# Patient Record
Sex: Female | Born: 1945 | Race: White | Hispanic: No | Marital: Married | Smoking: Never smoker
Health system: Southern US, Community
[De-identification: ages and names within clinical notes are randomized; demographics above are authoritative.]

## PROBLEM LIST (undated history)

## (undated) DIAGNOSIS — M7632 Iliotibial band syndrome, left leg: Secondary | ICD-10-CM

## (undated) DIAGNOSIS — E119 Type 2 diabetes mellitus without complications: Secondary | ICD-10-CM

## (undated) DIAGNOSIS — I10 Essential (primary) hypertension: Secondary | ICD-10-CM

## (undated) DIAGNOSIS — R5383 Other fatigue: Secondary | ICD-10-CM

## (undated) DIAGNOSIS — I471 Supraventricular tachycardia, unspecified: Secondary | ICD-10-CM

## (undated) DIAGNOSIS — N183 Chronic kidney disease, stage 3 unspecified: Secondary | ICD-10-CM

## (undated) DIAGNOSIS — I700 Atherosclerosis of aorta: Secondary | ICD-10-CM

## (undated) DIAGNOSIS — I2510 Atherosclerotic heart disease of native coronary artery without angina pectoris: Secondary | ICD-10-CM

## (undated) DIAGNOSIS — E538 Deficiency of other specified B group vitamins: Secondary | ICD-10-CM

## (undated) DIAGNOSIS — N3281 Overactive bladder: Secondary | ICD-10-CM

## (undated) DIAGNOSIS — M1990 Unspecified osteoarthritis, unspecified site: Secondary | ICD-10-CM

## (undated) DIAGNOSIS — F329 Major depressive disorder, single episode, unspecified: Secondary | ICD-10-CM

## (undated) DIAGNOSIS — D649 Anemia, unspecified: Secondary | ICD-10-CM

## (undated) DIAGNOSIS — R0609 Other forms of dyspnea: Secondary | ICD-10-CM

## (undated) DIAGNOSIS — E559 Vitamin D deficiency, unspecified: Secondary | ICD-10-CM

## (undated) DIAGNOSIS — E669 Obesity, unspecified: Secondary | ICD-10-CM

## (undated) DIAGNOSIS — N398 Other specified disorders of urinary system: Secondary | ICD-10-CM

## (undated) DIAGNOSIS — H903 Sensorineural hearing loss, bilateral: Secondary | ICD-10-CM

## (undated) DIAGNOSIS — H9193 Unspecified hearing loss, bilateral: Secondary | ICD-10-CM

## (undated) DIAGNOSIS — R001 Bradycardia, unspecified: Secondary | ICD-10-CM

## (undated) DIAGNOSIS — Z9889 Other specified postprocedural states: Secondary | ICD-10-CM

## (undated) DIAGNOSIS — D509 Iron deficiency anemia, unspecified: Secondary | ICD-10-CM

## (undated) DIAGNOSIS — I493 Ventricular premature depolarization: Secondary | ICD-10-CM

## (undated) DIAGNOSIS — I491 Atrial premature depolarization: Secondary | ICD-10-CM

## (undated) DIAGNOSIS — N393 Stress incontinence (female) (male): Secondary | ICD-10-CM

## (undated) DIAGNOSIS — I6529 Occlusion and stenosis of unspecified carotid artery: Secondary | ICD-10-CM

## (undated) DIAGNOSIS — E785 Hyperlipidemia, unspecified: Secondary | ICD-10-CM

## (undated) DIAGNOSIS — N814 Uterovaginal prolapse, unspecified: Secondary | ICD-10-CM

## (undated) DIAGNOSIS — N2889 Other specified disorders of kidney and ureter: Secondary | ICD-10-CM

## (undated) DIAGNOSIS — M109 Gout, unspecified: Secondary | ICD-10-CM

## (undated) DIAGNOSIS — N289 Disorder of kidney and ureter, unspecified: Secondary | ICD-10-CM

## (undated) DIAGNOSIS — R112 Nausea with vomiting, unspecified: Secondary | ICD-10-CM

## (undated) HISTORY — PX: PARATHYROIDECTOMY: SHX19

## (undated) HISTORY — PX: KNEE SURGERY: SHX244

## (undated) HISTORY — PX: JOINT REPLACEMENT: SHX530

## (undated) HISTORY — PX: ABDOMINAL HYSTERECTOMY: SHX81

## (undated) HISTORY — PX: CHOLECYSTECTOMY: SHX55

## (undated) HISTORY — PX: BLADDER SUSPENSION: SHX72

## (undated) HISTORY — PX: CYSTOURETHROSCOPY: SHX476

---

## 2004-07-06 ENCOUNTER — Other Ambulatory Visit: Payer: Self-pay

## 2004-07-15 ENCOUNTER — Inpatient Hospital Stay: Payer: Self-pay | Admitting: Unknown Physician Specialty

## 2004-09-01 ENCOUNTER — Ambulatory Visit: Payer: Self-pay | Admitting: Family Medicine

## 2005-01-29 ENCOUNTER — Ambulatory Visit: Payer: Self-pay | Admitting: Gastroenterology

## 2005-04-01 ENCOUNTER — Ambulatory Visit: Payer: Self-pay | Admitting: Unknown Physician Specialty

## 2005-06-07 ENCOUNTER — Ambulatory Visit: Payer: Self-pay | Admitting: General Surgery

## 2005-11-19 ENCOUNTER — Ambulatory Visit: Payer: Self-pay | Admitting: Family Medicine

## 2005-11-25 ENCOUNTER — Ambulatory Visit: Payer: Self-pay | Admitting: Family Medicine

## 2006-03-28 ENCOUNTER — Other Ambulatory Visit: Payer: Self-pay

## 2006-04-01 ENCOUNTER — Observation Stay: Payer: Self-pay | Admitting: Podiatry

## 2006-05-25 ENCOUNTER — Ambulatory Visit: Payer: Self-pay | Admitting: Family Medicine

## 2007-02-02 ENCOUNTER — Ambulatory Visit: Payer: Self-pay | Admitting: Family Medicine

## 2008-06-06 ENCOUNTER — Ambulatory Visit: Payer: Self-pay | Admitting: Internal Medicine

## 2008-10-10 ENCOUNTER — Ambulatory Visit: Payer: Self-pay | Admitting: Gastroenterology

## 2009-07-08 ENCOUNTER — Ambulatory Visit: Payer: Self-pay | Admitting: Internal Medicine

## 2010-08-25 ENCOUNTER — Ambulatory Visit: Payer: Self-pay | Admitting: Internal Medicine

## 2010-12-16 ENCOUNTER — Ambulatory Visit: Payer: Self-pay | Admitting: Unknown Physician Specialty

## 2011-01-18 ENCOUNTER — Ambulatory Visit: Payer: Self-pay | Admitting: Unknown Physician Specialty

## 2011-01-25 ENCOUNTER — Inpatient Hospital Stay: Payer: Self-pay | Admitting: Unknown Physician Specialty

## 2011-12-03 ENCOUNTER — Ambulatory Visit: Payer: Self-pay | Admitting: Internal Medicine

## 2013-02-09 ENCOUNTER — Ambulatory Visit: Payer: Self-pay | Admitting: Internal Medicine

## 2013-03-19 ENCOUNTER — Ambulatory Visit: Payer: Self-pay | Admitting: Obstetrics and Gynecology

## 2013-03-19 LAB — BASIC METABOLIC PANEL WITH GFR
Anion Gap: 5 — ABNORMAL LOW
BUN: 26 mg/dL — ABNORMAL HIGH
Calcium, Total: 9.3 mg/dL
Chloride: 108 mmol/L — ABNORMAL HIGH
Co2: 29 mmol/L
Creatinine: 1.22 mg/dL
EGFR (African American): 54 — ABNORMAL LOW
EGFR (Non-African Amer.): 46 — ABNORMAL LOW
Glucose: 134 mg/dL — ABNORMAL HIGH
Osmolality: 290
Potassium: 4 mmol/L
Sodium: 142 mmol/L

## 2013-03-19 LAB — HEMOGLOBIN: HGB: 13.9 g/dL

## 2013-03-19 LAB — PREGNANCY, URINE: Pregnancy Test, Urine: NEGATIVE m[IU]/mL

## 2013-03-26 ENCOUNTER — Ambulatory Visit: Payer: Self-pay | Admitting: Obstetrics and Gynecology

## 2013-05-18 ENCOUNTER — Ambulatory Visit: Payer: Self-pay | Admitting: Internal Medicine

## 2013-06-26 DIAGNOSIS — N819 Female genital prolapse, unspecified: Secondary | ICD-10-CM | POA: Insufficient documentation

## 2013-06-26 DIAGNOSIS — E559 Vitamin D deficiency, unspecified: Secondary | ICD-10-CM | POA: Insufficient documentation

## 2013-06-26 DIAGNOSIS — N189 Chronic kidney disease, unspecified: Secondary | ICD-10-CM | POA: Insufficient documentation

## 2013-06-26 DIAGNOSIS — I10 Essential (primary) hypertension: Secondary | ICD-10-CM | POA: Insufficient documentation

## 2013-06-26 DIAGNOSIS — E669 Obesity, unspecified: Secondary | ICD-10-CM | POA: Insufficient documentation

## 2013-07-09 DIAGNOSIS — N814 Uterovaginal prolapse, unspecified: Secondary | ICD-10-CM | POA: Insufficient documentation

## 2013-07-09 DIAGNOSIS — N393 Stress incontinence (female) (male): Secondary | ICD-10-CM | POA: Insufficient documentation

## 2013-08-23 DIAGNOSIS — N398 Other specified disorders of urinary system: Secondary | ICD-10-CM | POA: Insufficient documentation

## 2013-08-23 DIAGNOSIS — N3281 Overactive bladder: Secondary | ICD-10-CM | POA: Insufficient documentation

## 2013-08-23 DIAGNOSIS — Z9889 Other specified postprocedural states: Secondary | ICD-10-CM | POA: Insufficient documentation

## 2014-06-14 ENCOUNTER — Ambulatory Visit: Payer: Self-pay | Admitting: Internal Medicine

## 2014-07-19 DIAGNOSIS — Z461 Encounter for fitting and adjustment of hearing aid: Secondary | ICD-10-CM | POA: Insufficient documentation

## 2015-01-03 NOTE — Op Note (Signed)
PATIENT NAME:  Gabrielle Savage, PETRIDES MR#:  [id number] DATE OF BIRTH:  Dec 06, 1945  DATE OF PROCEDURE:  03/26/2013  PREOPERATIVE DIAGNOSIS: Symptomatic pelvic organ prolapse.  POSTOPERATIVE DIAGNOSIS: Symptomatic pelvic organ prolapse.  PROCEDURE: 1.  Anterior colporrhaphy.  2.  Posterior colporrhaphy. 3.  Enterocele repair.   SURGEON: Ricky L. Amalia Hailey, M.D.   ASSISTANT: Dr. Ouida Sills.   ANESTHESIA: General orotracheal.   FINDINGS: Moderate cystocele, moderate rectocele, significant uterine descensus, much more evident with the patient anesthetized. Again, an enterocele was encountered at the upper posterior vault. Excellent hemostasis and cosmesis.   ESTIMATED BLOOD LOSS: 75 mL.   COMPLICATIONS: None.   SPECIMENS: None.   DRAINS: Foley. Packing is present, soaked in sulfonamide cream  ANTIBIOTICS: 2 grams Ancef preoperatively IV.   PROCEDURE IN DETAIL: The patient was consented, taken to the operating room and placed  in the supine position where anesthesia was initiated. She was placed in the dorsal lithotomy position using Allen stirrups, prepped and draped in the usual sterile fashion and again it was noted at the cervix was sitting at the hymenal ring. The patient had previously expressed desire to not undergo hysterectomy, therefore the anterior vault was visualized. A small Allis was placed 3 cm from the external urethral orifice and immediately above the cervix anteriorly. The anterior wall with then injected with vasopressin. A longitudinal incision was made with a #15 blade. It was blunt and sharp dissected laterally to isolate the cystocele. The central bulge of the cystocele was reduced with pursestrings of 0 Vicryl and then 2-0 Ethibond was used to complete repair. Excess skin was trimmed and the incisions were closed with running interlocking of 2-0 Vicryl.   Next, we turned out attention to the posterior vault. Small Allis clamps were placed at 5 and 7 o'clock wedged along  the perineum. It was excised after injecting vasopressin, but adherent on the posterior vault. Then posterior midline vault in a similar fashion as the anterior. I did encounter enterocele, which was reduced with again successive pursestrings of 2-0 Vicryl. Posterior repair was completed using 2-0 Ethibond and skin closure was completed with a running interlocking of 2-0 and 0 Vicryl. Perineal body was rebuilt using 0 Vicryl as well.   Packing soaked in sulfonamide cream was placed. Foley catheter was placed with spillage of good clear urine and the end of gauze was tied to the catheter.   The patient tolerated the procedure well. I anticipate a routine postoperative course.   The patient will be instructed to minimize maximal straining and if she continues to have symptomatic bulge, she most likely would need hysterectomy including vault suspension, most likely tertiary center.   ____________________________ Rockey Situ. Amalia Hailey, MD rle:aw D: 03/26/2013 12:38:22 ET T: 03/26/2013 13:58:16 ET JOB#: [id number]  cc: Audry Pili L. Amalia Hailey, MD, <Dictator> Selmer Dominion MD ELECTRONICALLY SIGNED 03/27/2013 9:15

## 2015-05-05 DIAGNOSIS — R5383 Other fatigue: Secondary | ICD-10-CM | POA: Insufficient documentation

## 2015-06-13 DIAGNOSIS — H903 Sensorineural hearing loss, bilateral: Secondary | ICD-10-CM | POA: Insufficient documentation

## 2015-12-08 ENCOUNTER — Other Ambulatory Visit: Payer: Self-pay | Admitting: Internal Medicine

## 2015-12-08 DIAGNOSIS — Z1231 Encounter for screening mammogram for malignant neoplasm of breast: Secondary | ICD-10-CM

## 2015-12-19 ENCOUNTER — Ambulatory Visit
Admission: RE | Admit: 2015-12-19 | Discharge: 2015-12-19 | Disposition: A | Discharge status: Home / Self Care | Payer: BLUE CROSS/BLUE SHIELD | Source: Ambulatory Visit | Attending: Internal Medicine | Admitting: Internal Medicine

## 2015-12-19 DIAGNOSIS — Z1231 Encounter for screening mammogram for malignant neoplasm of breast: Secondary | ICD-10-CM | POA: Diagnosis present

## 2016-02-10 DIAGNOSIS — R42 Dizziness and giddiness: Secondary | ICD-10-CM | POA: Insufficient documentation

## 2016-02-16 ENCOUNTER — Other Ambulatory Visit: Payer: Self-pay | Admitting: Otolaryngology

## 2016-02-16 ENCOUNTER — Emergency Department
Admission: EM | Admit: 2016-02-16 | Discharge: 2016-02-16 | Disposition: A | Discharge status: Home / Self Care | Payer: BLUE CROSS/BLUE SHIELD | Attending: Emergency Medicine | Admitting: Emergency Medicine

## 2016-02-16 ENCOUNTER — Encounter: Payer: Self-pay | Admitting: Emergency Medicine

## 2016-02-16 ENCOUNTER — Ambulatory Visit
Admission: RE | Admit: 2016-02-16 | Discharge: 2016-02-16 | Disposition: A | Discharge status: Home / Self Care | Payer: BLUE CROSS/BLUE SHIELD | Source: Ambulatory Visit | Attending: Otolaryngology | Admitting: Otolaryngology

## 2016-02-16 DIAGNOSIS — I10 Essential (primary) hypertension: Secondary | ICD-10-CM | POA: Diagnosis not present

## 2016-02-16 DIAGNOSIS — R51 Headache: Secondary | ICD-10-CM | POA: Insufficient documentation

## 2016-02-16 DIAGNOSIS — R42 Dizziness and giddiness: Secondary | ICD-10-CM

## 2016-02-16 DIAGNOSIS — M1990 Unspecified osteoarthritis, unspecified site: Secondary | ICD-10-CM | POA: Insufficient documentation

## 2016-02-16 DIAGNOSIS — R519 Headache, unspecified: Secondary | ICD-10-CM

## 2016-02-16 HISTORY — DX: Essential (primary) hypertension: I10

## 2016-02-16 HISTORY — DX: Unspecified osteoarthritis, unspecified site: M19.90

## 2016-02-16 HISTORY — DX: Disorder of kidney and ureter, unspecified: N28.9

## 2016-02-16 LAB — BASIC METABOLIC PANEL
ANION GAP: 8 (ref 5–15)
BUN: 37 mg/dL — ABNORMAL HIGH (ref 6–20)
CO2: 23 mmol/L (ref 22–32)
Calcium: 9.7 mg/dL (ref 8.9–10.3)
Chloride: 108 mmol/L (ref 101–111)
Creatinine, Ser: 1.34 mg/dL — ABNORMAL HIGH (ref 0.44–1.00)
GFR calc Af Amer: 46 mL/min — ABNORMAL LOW (ref >60)
GFR calc non Af Amer: 40 mL/min — ABNORMAL LOW (ref >60)
GLUCOSE: 118 mg/dL — AB (ref 65–99)
POTASSIUM: 4 mmol/L (ref 3.5–5.1)
Sodium: 139 mmol/L (ref 135–145)

## 2016-02-16 LAB — CBC
HEMATOCRIT: 39.1 % (ref 35.0–47.0)
HEMOGLOBIN: 12.9 g/dL (ref 12.0–16.0)
MCH: 29.9 pg (ref 26.0–34.0)
MCHC: 33.1 g/dL (ref 32.0–36.0)
MCV: 90.4 fL (ref 80.0–100.0)
Platelets: 140 10*3/uL — ABNORMAL LOW (ref 150–440)
RBC: 4.32 MIL/uL (ref 3.80–5.20)
RDW: 15.1 % — ABNORMAL HIGH (ref 11.5–14.5)
WBC: 6.4 10*3/uL (ref 3.6–11.0)

## 2016-02-16 MED ORDER — SODIUM CHLORIDE 0.9 % IV BOLUS (SEPSIS)
500.0000 mL | Freq: Once | INTRAVENOUS | Status: AC
  Administered 2016-02-16: 500 mL via INTRAVENOUS

## 2016-02-16 MED ORDER — PROCHLORPERAZINE MALEATE 10 MG PO TABS: 10.0000 mg | ORAL_TABLET | Freq: Three times a day (TID) | ORAL | Status: DC | PRN

## 2016-02-16 MED ORDER — PROCHLORPERAZINE EDISYLATE 5 MG/ML IJ SOLN
10.0000 mg | Freq: Once | INTRAMUSCULAR | Status: AC
  Administered 2016-02-16: 10 mg via INTRAVENOUS
  Filled 2016-02-16: qty 2

## 2016-02-16 NOTE — Discharge Instructions (Signed)
Please seek medical attention for any high fevers, chest pain, shortness of breath, change in behavior, persistent vomiting, bloody stool or any other new or concerning symptoms. ° ° °General Headache Without Cause °A headache is pain or discomfort felt around the head or neck area. The specific cause of a headache may not be found. There are many causes and types of headaches. A few common ones are: °· Tension headaches. °· Migraine headaches. °· Cluster headaches. °· Chronic daily headaches. °HOME CARE INSTRUCTIONS  °Watch your condition for any changes. Take these steps to help with your condition: °Managing Pain °· Take over-the-counter and prescription medicines only as told by your health care provider. °· Lie down in a dark, quiet room when you have a headache. °· If directed, apply ice to the head and neck area: °¨ Put ice in a plastic bag. °¨ Place a towel between your skin and the bag. °¨ Leave the ice on for 20 minutes, 2-3 times per day. °· Use a heating pad or hot shower to apply heat to the head and neck area as told by your health care provider. °· Keep lights dim if bright lights bother you or make your headaches worse. °Eating and Drinking °· Eat meals on a regular schedule. °· Limit alcohol use. °· Decrease the amount of caffeine you drink, or stop drinking caffeine. °General Instructions °· Keep all follow-up visits as told by your health care provider. This is important. °· Keep a headache journal to help find out what may trigger your headaches. For example, write down: °¨ What you eat and drink. °¨ How much sleep you get. °¨ Any change to your diet or medicines. °· Try massage or other relaxation techniques. °· Limit stress. °· Sit up straight, and do not tense your muscles. °· Do not use tobacco products, including cigarettes, chewing tobacco, or e-cigarettes. If you need help quitting, ask your health care provider. °· Exercise regularly as told by your health care provider. °· Sleep on a  regular schedule. Get 7-9 hours of sleep, or the amount recommended by your health care provider. °SEEK MEDICAL CARE IF:  °· Your symptoms are not helped by medicine. °· You have a headache that is different from the usual headache. °· You have nausea or you vomit. °· You have a fever. °SEEK IMMEDIATE MEDICAL CARE IF:  °· Your headache becomes severe. °· You have repeated vomiting. °· You have a stiff neck. °· You have a loss of vision. °· You have problems with speech. °· You have pain in the eye or ear. °· You have muscular weakness or loss of muscle control. °· You lose your balance or have trouble walking. °· You feel faint or pass out. °· You have confusion. °  °This information is not intended to replace advice given to you by your health care provider. Make sure you discuss any questions you have with your health care provider. °  °Document Released: 08/30/2005 Document Revised: 05/21/2015 Document Reviewed: 12/23/2014 °Elsevier Interactive Patient Education ©2016 [employer] ° °

## 2016-02-16 NOTE — ED Provider Notes (Signed)
[facility] Emergency Department Provider Note    ____________________________________________  Time seen: ~1845  I have reviewed the triage vital signs and the nursing notes.   HISTORY  Chief Complaint Headache and Dizziness   History limited by: Not Limited   HPI Gabrielle Savage is a 70 y.o. female who presents to the emergency department the request of her ENT doctor to undergo an MRI of her brain to further evaluate for headaches. Patient had an outpatient CT scan done today which did not show any acute lesions. When she talked to her ENT doctor, Dr. Pryor Ochoa, they requested that she present to the ER for an MRI. She has had the headache for roughly 3 weeks. It has been constant. It is located behind both eyes. She has had some dizziness with the headache. Denies any trauma.    Past Medical History  Diagnosis Date  . Hypertension   . Renal disorder   . Arthritis     There are no active problems to display for this patient.   Past Surgical History  Procedure Laterality Date  . Knee surgery    . Cholecystectomy    . Parathyroidectomy    . Abdominal hysterectomy      No current outpatient prescriptions on file.  Allergies Codeine  No family history on file.  Social History Social History  Substance Use Topics  . Smoking status: Never Smoker   . Smokeless tobacco: None  . Alcohol Use: Yes    Review of Systems  Constitutional: Negative for fever. Cardiovascular: Negative for chest pain. Respiratory: Negative for shortness of breath. Gastrointestinal: Negative for abdominal pain, vomiting and diarrhea. Genitourinary: Negative for dysuria. Musculoskeletal: Negative for back pain. Skin: Negative for rash. Neurological: Positive for headache.  10-point ROS otherwise negative.  ____________________________________________   PHYSICAL EXAM:  VITAL SIGNS: ED Triage Vitals  Enc Vitals Group     BP 02/16/16 1706 142/71 mmHg      Pulse Rate 02/16/16 1706 71     Resp 02/16/16 1706 18     Temp 02/16/16 1706 97.8 F (36.6 C)     Temp Source 02/16/16 1706 Oral     SpO2 02/16/16 1706 100 %     Weight 02/16/16 1706 198 lb (89.812 kg)     Height 02/16/16 1706 5\' 2"  (1.575 m)     Head Cir --      Peak Flow --      Pain Score 02/16/16 1707 9   Constitutional: Alert and oriented. Well appearing and in no distress. Eyes: Conjunctivae are normal. PERRL. Normal extraocular movements. ENT   Head: Normocephalic and atraumatic.   Nose: No congestion/rhinnorhea.   Mouth/Throat: Mucous membranes are moist.   Neck: No stridor. Hematological/Lymphatic/Immunilogical: No cervical lymphadenopathy. Cardiovascular: Normal rate, regular rhythm.  No murmurs, rubs, or gallops. Respiratory: Normal respiratory effort without tachypnea nor retractions. Breath sounds are clear and equal bilaterally. No wheezes/rales/rhonchi. Gastrointestinal: Soft and nontender. No distention. There is no CVA tenderness. Genitourinary: Deferred Musculoskeletal: Normal range of motion in all extremities. No joint effusions.  No lower extremity tenderness nor edema. Neurologic:  Normal speech and language. No gross focal neurologic deficits are appreciated.  Skin:  Skin is warm, dry and intact. No rash noted. Psychiatric: Mood and affect are normal. Speech and behavior are normal. Patient exhibits appropriate insight and judgment.  ____________________________________________    LABS (pertinent positives/negatives)  Labs Reviewed  BASIC METABOLIC PANEL - Abnormal; Notable for the following:    Glucose, Bld  118 (*)    BUN 37 (*)    Creatinine, Ser 1.34 (*)    GFR calc non Af Amer 40 (*)    GFR calc Af Amer 46 (*)    All other components within normal limits  CBC - Abnormal; Notable for the following:    RDW 15.1 (*)    Platelets 140 (*)    All other components within normal limits  URINALYSIS COMPLETEWITH MICROSCOPIC ([facility] ONLY)   CBG MONITORING, ED     ____________________________________________   EKG  I, Nance Pear, attending physician, personally viewed and interpreted this EKG  EKG Time: 1712 Rate: 68 Rhythm: normal sinus rhythm Axis: left axis deviation Intervals: qtc 499 QRS: narrow ST changes: no st elevation Impression: abnormal ekg  ____________________________________________    RADIOLOGY  CT head from today ordered as outpatient IMPRESSION: No intracranial hemorrhage or CT evidence of large acute infarct.  Vascular calcifications.  ____________________________________________   PROCEDURES  Procedure(s) performed: None  Critical Care performed: No  ____________________________________________   INITIAL IMPRESSION / ASSESSMENT AND PLAN / ED COURSE  Pertinent labs & imaging results that were available during my care of the patient were reviewed by me and considered in my medical decision making (see chart for details).  Patient presents to ED for MRI at the request of her ENT doctor for headache and dizziness. Will obtain MRI, treat pain.  ----------------------------------------- 8:30 PM on 02/16/2016 -----------------------------------------  Patient still waiting to be taken to the MRI. However she did express to the nurse that she would like to be discharged. I went and talked to the patient. She states her headache did improve somewhat after the Compazine and fluids. At this point the patient states that she would rather go home. I think this is reasonable. Do not think she necessarily requires emergent MRI at this time given prolonged symptoms. Did discuss with the patient that she should follow-up with primary care and ENT.  ____________________________________________   FINAL CLINICAL IMPRESSION(S) / ED DIAGNOSES  Final diagnoses:  Headache, unspecified headache type     Note: This dictation was prepared with Dragon dictation. Any transcriptional errors  that result from this process are unintentional    Nance Pear, MD 02/16/16 2031

## 2016-02-16 NOTE — ED Notes (Signed)
Pt presents with headache x 2.5-3 weeks. Pt states it hurts across her forehead and temples. Pt denies any injury. States that she feels dizzy when she stands up approximately 75% of the time. Pt alert & oriented with no neuro deficits noted.

## 2016-02-16 NOTE — ED Notes (Signed)
Dizziness and headache x3 weeks getting worse, denies visual changes.

## 2016-02-16 NOTE — ED Notes (Signed)
Wood Novacek RN ambulated the Pt to the bedside commode without difficulty. Pt states that she is ready to go home. MD notified.

## 2016-11-15 ENCOUNTER — Other Ambulatory Visit: Payer: Self-pay | Admitting: Internal Medicine

## 2016-11-15 DIAGNOSIS — Z1231 Encounter for screening mammogram for malignant neoplasm of breast: Secondary | ICD-10-CM

## 2016-12-21 ENCOUNTER — Ambulatory Visit
Admission: RE | Admit: 2016-12-21 | Discharge: 2016-12-21 | Disposition: A | Discharge status: Home / Self Care | Payer: Medicare Other | Source: Ambulatory Visit | Attending: Internal Medicine | Admitting: Internal Medicine

## 2016-12-21 DIAGNOSIS — Z1231 Encounter for screening mammogram for malignant neoplasm of breast: Secondary | ICD-10-CM | POA: Diagnosis not present

## 2017-05-12 DIAGNOSIS — Z96659 Presence of unspecified artificial knee joint: Secondary | ICD-10-CM | POA: Insufficient documentation

## 2017-05-12 DIAGNOSIS — M7632 Iliotibial band syndrome, left leg: Secondary | ICD-10-CM | POA: Insufficient documentation

## 2017-06-20 DIAGNOSIS — N183 Chronic kidney disease, stage 3 unspecified: Secondary | ICD-10-CM | POA: Insufficient documentation

## 2017-06-20 DIAGNOSIS — R739 Hyperglycemia, unspecified: Secondary | ICD-10-CM | POA: Insufficient documentation

## 2017-07-22 ENCOUNTER — Encounter: Payer: Self-pay | Admitting: *Deleted

## 2017-07-25 ENCOUNTER — Ambulatory Visit
Admission: RE | Admit: 2017-07-25 | Discharge: 2017-07-25 | Disposition: A | Discharge status: Home / Self Care | Payer: Medicare Other | Source: Ambulatory Visit | Attending: Gastroenterology | Admitting: Gastroenterology

## 2017-07-25 ENCOUNTER — Ambulatory Visit: Payer: Medicare Other | Admitting: Certified Registered Nurse Anesthetist

## 2017-07-25 ENCOUNTER — Encounter: Payer: Self-pay | Admitting: *Deleted

## 2017-07-25 ENCOUNTER — Encounter
Admission: RE | Disposition: A | Discharge status: Home / Self Care | Payer: Self-pay | Source: Ambulatory Visit | Attending: Gastroenterology

## 2017-07-25 DIAGNOSIS — K5730 Diverticulosis of large intestine without perforation or abscess without bleeding: Secondary | ICD-10-CM | POA: Diagnosis not present

## 2017-07-25 DIAGNOSIS — E669 Obesity, unspecified: Secondary | ICD-10-CM | POA: Diagnosis not present

## 2017-07-25 DIAGNOSIS — E559 Vitamin D deficiency, unspecified: Secondary | ICD-10-CM | POA: Diagnosis not present

## 2017-07-25 DIAGNOSIS — D125 Benign neoplasm of sigmoid colon: Secondary | ICD-10-CM | POA: Insufficient documentation

## 2017-07-25 DIAGNOSIS — Z7982 Long term (current) use of aspirin: Secondary | ICD-10-CM | POA: Diagnosis not present

## 2017-07-25 DIAGNOSIS — D124 Benign neoplasm of descending colon: Secondary | ICD-10-CM | POA: Insufficient documentation

## 2017-07-25 DIAGNOSIS — D123 Benign neoplasm of transverse colon: Secondary | ICD-10-CM | POA: Diagnosis not present

## 2017-07-25 DIAGNOSIS — Z6835 Body mass index (BMI) 35.0-35.9, adult: Secondary | ICD-10-CM | POA: Diagnosis not present

## 2017-07-25 DIAGNOSIS — Z1211 Encounter for screening for malignant neoplasm of colon: Secondary | ICD-10-CM | POA: Diagnosis present

## 2017-07-25 DIAGNOSIS — Z79899 Other long term (current) drug therapy: Secondary | ICD-10-CM | POA: Diagnosis not present

## 2017-07-25 DIAGNOSIS — Z86010 Personal history of colonic polyps: Secondary | ICD-10-CM | POA: Diagnosis not present

## 2017-07-25 DIAGNOSIS — I10 Essential (primary) hypertension: Secondary | ICD-10-CM | POA: Diagnosis not present

## 2017-07-25 HISTORY — DX: Obesity, unspecified: E66.9

## 2017-07-25 HISTORY — DX: Unspecified hearing loss, bilateral: H91.93

## 2017-07-25 HISTORY — DX: Other fatigue: R53.83

## 2017-07-25 HISTORY — DX: Vitamin D deficiency, unspecified: E55.9

## 2017-07-25 HISTORY — DX: Iliotibial band syndrome, left leg: M76.32

## 2017-07-25 HISTORY — DX: Uterovaginal prolapse, unspecified: N81.4

## 2017-07-25 HISTORY — DX: Stress incontinence (female) (male): N39.3

## 2017-07-25 HISTORY — DX: Overactive bladder: N32.81

## 2017-07-25 HISTORY — PX: COLONOSCOPY: SHX5424

## 2017-07-25 HISTORY — DX: Other specified disorders of urinary system: N39.8

## 2017-07-25 SURGERY — COLONOSCOPY
Anesthesia: General

## 2017-07-25 MED ORDER — LIDOCAINE HCL (CARDIAC) 20 MG/ML IV SOLN
INTRAVENOUS | Status: DC | PRN
  Administered 2017-07-25: 40 mg via INTRAVENOUS
  Administered 2017-07-25: 260 mg via INTRAVENOUS

## 2017-07-25 MED ORDER — SODIUM CHLORIDE 0.9 % IV SOLN
INTRAVENOUS | Status: DC
  Administered 2017-07-25: 09:00:00 via INTRAVENOUS

## 2017-07-25 MED ORDER — PROPOFOL 10 MG/ML IV BOLUS
INTRAVENOUS | Status: DC | PRN
  Administered 2017-07-25: 260 mg via INTRAVENOUS

## 2017-07-25 MED ORDER — SODIUM CHLORIDE 0.9 % IV SOLN: INTRAVENOUS | Status: DC

## 2017-07-25 MED ORDER — PROPOFOL 10 MG/ML IV BOLUS
INTRAVENOUS | Status: AC
  Filled 2017-07-25: qty 40

## 2017-07-25 MED ORDER — LIDOCAINE HCL (PF) 2 % IJ SOLN
INTRAMUSCULAR | Status: AC
  Filled 2017-07-25: qty 10

## 2017-07-25 NOTE — Anesthesia Postprocedure Evaluation (Signed)
Anesthesia Post Note  Patient: Gabrielle Savage  Procedure(s) Performed: COLONOSCOPY (N/A )  Patient location during evaluation: Endoscopy Anesthesia Type: General Level of consciousness: awake and alert and oriented Pain management: pain level controlled Vital Signs Assessment: post-procedure vital signs reviewed and stable Respiratory status: spontaneous breathing, nonlabored ventilation and respiratory function stable Cardiovascular status: blood pressure returned to baseline and stable Postop Assessment: no signs of nausea or vomiting Anesthetic complications: no     Last Vitals:  Vitals:   07/25/17 0930 07/25/17 0940  BP: (!) 113/55 (!) 110/59  Pulse: 64 (!) 58  Resp: 14 14  Temp:    SpO2: 97% 99%    Last Pain:  Vitals:   07/25/17 0910  TempSrc: Tympanic                 Samiksha Pellicano

## 2017-07-25 NOTE — Op Note (Signed)
[facility] Gastroenterology Patient Name: Gabrielle Savage Procedure Date: 07/25/2017 8:18 AM MRN: [id number] Account #: [id number] Date of Birth: September 15, 1945 Admit Type: Outpatient Age: 71 Room: [facility] ENDO ROOM 3 Gender: Female Note Status: Finalized Procedure:            Colonoscopy Indications:          Personal history of colonic polyps Providers:            [name], MD Referring MD:         Tracie Harrier, MD (Referring MD) Medicines:            Monitored Anesthesia Care Complications:        No immediate complications. Procedure:            Pre-Anesthesia Assessment:                       - ASA Grade Assessment: III - A patient with severe                        systemic disease.                       After obtaining informed consent, the colonoscope was                        passed under direct vision. Throughout the procedure,                        the patient's blood pressure, pulse, and oxygen                        saturations were monitored continuously. The Olympus                        PCF-H180AL colonoscope ( S#: [id number] ) was introduced                        through the anus and advanced to the the cecum,                        identified by appendiceal orifice and ileocecal valve.                        The colonoscopy was performed without difficulty. The                        patient tolerated the procedure well. The quality of                        the bowel preparation was good. Findings:      Multiple small-mouthed diverticula were found in the sigmoid colon.      A 3 mm polyp was found in the transverse colon. The polyp was sessile.       The polyp was removed with a cold biopsy forceps. Resection and       retrieval were complete.      A 7 mm polyp was found in the descending colon. The polyp was sessile.       The polyp was removed with a cold snare. Resection and retrieval were       complete.  A 2 mm polyp was  found in the distal descending colon. The polyp was       sessile. The polyp was removed with a cold biopsy forceps. Resection and       retrieval were complete.      A 6 mm polyp was found in the sigmoid colon. The polyp was sessile. The       polyp was removed with a cold snare. Resection and retrieval were       complete.      The retroflexed view of the distal rectum and anal verge was normal and       showed no anal or rectal abnormalities.      The digital rectal exam was normal. Pertinent negatives include note       esternal skin tags, normal in appearance. Impression:           - Diverticulosis in the sigmoid colon.                       - One 3 mm polyp in the transverse colon, removed with                        a cold biopsy forceps. Resected and retrieved.                       - One 7 mm polyp in the descending colon, removed with                        a cold snare. Resected and retrieved.                       - One 2 mm polyp in the distal descending colon,                        removed with a cold biopsy forceps. Resected and                        retrieved.                       - One 6 mm polyp in the sigmoid colon, removed with a                        cold snare. Resected and retrieved.                       - The distal rectum and anal verge are normal on                        retroflexion view. Recommendation:       - Discharge patient to home.                       - Await pathology results.                       - Telephone GI clinic for pathology results in 1 week. Procedure Code(s):    --- Professional ---                       [phone], Colonoscopy, flexible; with removal of tumor(s),  polyp(s), or other lesion(s) by snare technique                       45380, 59, Colonoscopy, flexible; with biopsy, single                        or multiple Diagnosis Code(s):    --- Professional ---                       D12.3, Benign neoplasm of  transverse colon (hepatic                        flexure or splenic flexure)                       D12.4, Benign neoplasm of descending colon                       D12.5, Benign neoplasm of sigmoid colon                       Z86.010, Personal history of colonic polyps                       K57.30, Diverticulosis of large intestine without                        perforation or abscess without bleeding CPT copyright 2016 American Medical Association. All rights reserved. The codes documented in this report are preliminary and upon coder review may  be revised to meet current compliance requirements. [name], MD 07/25/2017 9:15:46 AM This report has been signed electronically. Number of Addenda: 0 Note Initiated On: 07/25/2017 8:18 AM Scope Withdrawal Time: 0 hours 16 minutes 12 seconds  Total Procedure Duration: 0 hours 27 minutes 11 seconds       [facility]

## 2017-07-25 NOTE — Anesthesia Post-op Follow-up Note (Signed)
Anesthesia QCDR form completed.        

## 2017-07-25 NOTE — H&P (Signed)
Outpatient short stay form Pre-procedure 07/25/2017 8:31 AM Gabrielle Sails MD  Primary Physician: Dr Tracie Harrier  Reason for visit:  Colonoscopy  History of present illness:  Patient is a 71 year old female presenting today as above. She has personal history of adenomatous colon polyps. She tolerated her prep well. She does take a 325 mg aspirin daily that has been held for 5 days. She takes no other aspirin products or blood thinning agents.    Current Facility-Administered Medications:  .  0.9 %  sodium chloride infusion, , Intravenous, Continuous, Gabrielle Sails, MD .  0.9 %  sodium chloride infusion, , Intravenous, Continuous, Gabrielle Sails, MD  Medications Prior to Admission  Medication Sig Dispense Refill Last Dose  . allopurinol (ZYLOPRIM) 300 MG tablet Take 300 mg daily by mouth.   07/25/2017 at 645  . amLODipine (NORVASC) 5 MG tablet Take 5 mg daily by mouth.   07/25/2017 at 645  . aspirin EC 325 MG tablet Take 325 mg daily by mouth.   Past Week at Unknown time  . citalopram (CELEXA) 20 MG tablet Take 20 mg daily by mouth.   07/25/2017 at 645  . Cyanocobalamin (VITAMIN B 12 PO) Take 1,000 mcg/mL by mouth.   Past Week at Unknown time  . ergocalciferol (VITAMIN D2) 50000 units capsule Take 50,000 Units once a week by mouth.   Past Week at Unknown time  . furosemide (LASIX) 20 MG tablet Take 20 mg by mouth.   07/25/2017 at 645  . iron polysaccharides (NIFEREX) 150 MG capsule Take 150 mg daily by mouth.   Past Week at Unknown time  . olmesartan (BENICAR) 40 MG tablet Take 40 mg daily by mouth.   07/25/2017 at 0645time  . polyethylene glycol-electrolytes (NULYTELY/GOLYTELY) 420 g solution Take 4,000 mLs once by mouth.   07/24/2017 at Unknown time  . prochlorperazine (COMPAZINE) 10 MG tablet Take 1 tablet (10 mg total) by mouth every 8 (eight) hours as needed (headache). 20 tablet 0 Past Week at Unknown time     Allergies  Allergen Reactions  . Codeine   .  Nsaids Other (See Comments)  . Oxycodone Nausea Only  . Propofol Other (See Comments)     Past Medical History:  Diagnosis Date  . Arthritis   . Fatigue   . Hearing loss of both ears   . Hypertension   . Iliotibial band syndrome, left leg   . OAB (overactive bladder)   . Obesity   . Renal disorder    STAGE 3  . SUI (stress urinary incontinence, female)   . Uterovaginal prolapse   . Vitamin D deficiency   . Voiding dysfunction     Review of systems:      Physical Exam    Heart and lungs: Regular rate and rhythm without rub or gallop, lungs are bilaterally clear.    HEENT: Normocephalic atraumatic eyes are anicteric    Other:     Pertinant exam for procedure: Soft nontender nondistended bowel sounds positive normoactive.    Planned proceedures: Colonoscopy and indicated procedures. I have discussed the risks benefits and complications of procedures to include not limited to bleeding, infection, perforation and the risk of sedation and the patient wishes to proceed.    Gabrielle Sails, MD Gastroenterology 07/25/2017  8:31 AM

## 2017-07-25 NOTE — Transfer of Care (Signed)
Immediate Anesthesia Transfer of Care Note  Patient: Gabrielle Savage  Procedure(s) Performed: COLONOSCOPY (N/A )  Patient Location: PACU  Anesthesia Type:MAC  Level of Consciousness: awake, alert  and patient cooperative  Airway & Oxygen Therapy: Patient Spontanous Breathing and Patient connected to nasal cannula oxygen  Post-op Assessment: Report given to RN and Post -op Vital signs reviewed and stable  Post vital signs: Reviewed and stable  Last Vitals:  Vitals:   07/25/17 0754 07/25/17 0910  BP: (!) 143/67 (!) 99/56  Pulse: 79 63  Resp: 20 20  Temp: (!) 36.1 C (!) 36.1 C  SpO2: 100% 99%    Last Pain:  Vitals:   07/25/17 0910  TempSrc: Tympanic      Patients Stated Pain Goal: 0 (25/27/12 9290)  Complications: No apparent anesthesia complications

## 2017-07-25 NOTE — Anesthesia Preprocedure Evaluation (Signed)
Anesthesia Evaluation  Patient identified by MRN, date of birth, ID band Patient awake    Reviewed: Allergy & Precautions, NPO status , Patient's Chart, lab work & pertinent test results  History of Anesthesia Complications Negative for: history of anesthetic complications  Airway Mallampati: II  TM Distance: >3 FB Neck ROM: Full    Dental no notable dental hx.    Pulmonary neg pulmonary ROS, neg sleep apnea, neg COPD,    breath sounds clear to auscultation- rhonchi (-) wheezing      Cardiovascular hypertension, Pt. on medications (-) CAD, (-) Past MI and (-) Cardiac Stents  Rhythm:Regular Rate:Normal - Systolic murmurs and - Diastolic murmurs    Neuro/Psych negative neurological ROS  negative psych ROS   GI/Hepatic negative GI ROS, Neg liver ROS,   Endo/Other  negative endocrine ROSneg diabetes  Renal/GU Renal InsufficiencyRenal disease     Musculoskeletal  (+) Arthritis ,   Abdominal (+) + obese,   Peds  Hematology negative hematology ROS (+)   Anesthesia Other Findings Past Medical History: No date: Arthritis No date: Fatigue No date: Hearing loss of both ears No date: Hypertension No date: Iliotibial band syndrome, left leg No date: OAB (overactive bladder) No date: Obesity No date: Renal disorder     Comment:  STAGE 3 No date: SUI (stress urinary incontinence, female) No date: Uterovaginal prolapse No date: Vitamin D deficiency No date: Voiding dysfunction   Reproductive/Obstetrics                             Anesthesia Physical Anesthesia Plan  ASA: III  Anesthesia Plan: General   Post-op Pain Management:    Induction: Intravenous  PONV Risk Score and Plan: 2 and Propofol infusion  Airway Management Planned: Natural Airway  Additional Equipment:   Intra-op Plan:   Post-operative Plan:   Informed Consent: I have reviewed the patients History and Physical,  chart, labs and discussed the procedure including the risks, benefits and alternatives for the proposed anesthesia with the patient or authorized representative who has indicated his/her understanding and acceptance.   Dental advisory given  Plan Discussed with: CRNA and Anesthesiologist  Anesthesia Plan Comments: (Patient has propofol listed as an allergy because a kidney doctor in the 1990s told her she couldn't have propofol because of her kidney disease. I discussed with her that propofol is safe to use in renal failure, she denies ever having an allergic reaction to propofol, we will proceed cautiously with using propofol for the procedure. )        Anesthesia Quick Evaluation

## 2017-07-26 ENCOUNTER — Encounter: Payer: Self-pay | Admitting: Gastroenterology

## 2017-07-26 LAB — SURGICAL PATHOLOGY

## 2017-11-11 ENCOUNTER — Other Ambulatory Visit: Payer: Self-pay | Admitting: Internal Medicine

## 2017-11-11 DIAGNOSIS — Z1231 Encounter for screening mammogram for malignant neoplasm of breast: Secondary | ICD-10-CM

## 2017-12-27 ENCOUNTER — Ambulatory Visit
Admission: RE | Admit: 2017-12-27 | Discharge: 2017-12-27 | Disposition: A | Discharge status: Home / Self Care | Payer: Medicare Other | Source: Ambulatory Visit | Attending: Internal Medicine | Admitting: Internal Medicine

## 2017-12-27 DIAGNOSIS — Z1231 Encounter for screening mammogram for malignant neoplasm of breast: Secondary | ICD-10-CM | POA: Diagnosis present

## 2017-12-29 ENCOUNTER — Other Ambulatory Visit: Payer: Self-pay | Admitting: Internal Medicine

## 2017-12-29 DIAGNOSIS — R928 Other abnormal and inconclusive findings on diagnostic imaging of breast: Secondary | ICD-10-CM

## 2017-12-29 DIAGNOSIS — N6489 Other specified disorders of breast: Secondary | ICD-10-CM

## 2018-01-04 ENCOUNTER — Ambulatory Visit
Admission: RE | Admit: 2018-01-04 | Discharge: 2018-01-04 | Disposition: A | Discharge status: Home / Self Care | Payer: Medicare Other | Source: Ambulatory Visit | Attending: Internal Medicine | Admitting: Internal Medicine

## 2018-01-04 DIAGNOSIS — N6489 Other specified disorders of breast: Secondary | ICD-10-CM

## 2018-01-04 DIAGNOSIS — R928 Other abnormal and inconclusive findings on diagnostic imaging of breast: Secondary | ICD-10-CM | POA: Insufficient documentation

## 2018-08-27 ENCOUNTER — Emergency Department: Payer: Medicare Other

## 2018-08-27 ENCOUNTER — Other Ambulatory Visit: Payer: Self-pay

## 2018-08-27 ENCOUNTER — Emergency Department
Admission: EM | Admit: 2018-08-27 | Discharge: 2018-08-27 | Disposition: A | Discharge status: Home / Self Care | Payer: Medicare Other | Attending: Emergency Medicine | Admitting: Emergency Medicine

## 2018-08-27 DIAGNOSIS — Y9301 Activity, walking, marching and hiking: Secondary | ICD-10-CM | POA: Diagnosis not present

## 2018-08-27 DIAGNOSIS — Y999 Unspecified external cause status: Secondary | ICD-10-CM | POA: Insufficient documentation

## 2018-08-27 DIAGNOSIS — I10 Essential (primary) hypertension: Secondary | ICD-10-CM | POA: Diagnosis not present

## 2018-08-27 DIAGNOSIS — Z79899 Other long term (current) drug therapy: Secondary | ICD-10-CM | POA: Insufficient documentation

## 2018-08-27 DIAGNOSIS — S300XXA Contusion of lower back and pelvis, initial encounter: Secondary | ICD-10-CM | POA: Diagnosis not present

## 2018-08-27 DIAGNOSIS — W000XXA Fall on same level due to ice and snow, initial encounter: Secondary | ICD-10-CM | POA: Diagnosis not present

## 2018-08-27 DIAGNOSIS — S0990XA Unspecified injury of head, initial encounter: Secondary | ICD-10-CM

## 2018-08-27 DIAGNOSIS — Y9222 Religious institution as the place of occurrence of the external cause: Secondary | ICD-10-CM | POA: Diagnosis not present

## 2018-08-27 DIAGNOSIS — W19XXXA Unspecified fall, initial encounter: Secondary | ICD-10-CM

## 2018-08-27 DIAGNOSIS — Z7982 Long term (current) use of aspirin: Secondary | ICD-10-CM | POA: Diagnosis not present

## 2018-08-27 DIAGNOSIS — R51 Headache: Secondary | ICD-10-CM | POA: Diagnosis not present

## 2018-08-27 DIAGNOSIS — Z96659 Presence of unspecified artificial knee joint: Secondary | ICD-10-CM | POA: Insufficient documentation

## 2018-08-27 DIAGNOSIS — S3992XA Unspecified injury of lower back, initial encounter: Secondary | ICD-10-CM | POA: Diagnosis present

## 2018-08-27 NOTE — ED Triage Notes (Signed)
Pt states that this am she fell on her back on her deck due to the ice - c/o left knee pain, right lower back pain, and back of head - pt denies loss of consciousness - reports nausea - denies blurred vision - denies dizziness

## 2018-08-27 NOTE — ED Provider Notes (Signed)
[facility] Emergency Department Provider Note   ____________________________________________    I have reviewed the triage vital signs and the nursing notes.   HISTORY  Chief Complaint Fall and Head Injury     HPI Gabrielle Savage is a 72 y.o. female who presents today after falling earlier this morning.  Patient reports she was at church this morning and slipped on an icy ramp and her feet went out from underneath her and she fell flat on her back and hit her head.  She complains of low back pain, no LOC, mild neck discomfort.  No neuro deficits.  Normal strength in the lower extremities.  Complains of moderate aching low back pain which developed many hours after the injury.   Past Medical History:  Diagnosis Date  . Arthritis   . Fatigue   . Hearing loss of both ears   . Hypertension   . Iliotibial band syndrome, left leg   . OAB (overactive bladder)   . Obesity   . Renal disorder    STAGE 3  . SUI (stress urinary incontinence, female)   . Uterovaginal prolapse   . Vitamin D deficiency   . Voiding dysfunction     There are no active problems to display for this patient.   Past Surgical History:  Procedure Laterality Date  . ABDOMINAL HYSTERECTOMY    . CHOLECYSTECTOMY    . COLONOSCOPY N/A 07/25/2017   Procedure: COLONOSCOPY;  Surgeon: [name], MD;  Location: [facility] ENDOSCOPY;  Service: Endoscopy;  Laterality: N/A;  . JOINT REPLACEMENT     PARTIAL KNEE REPLACEMENT  . KNEE SURGERY    . PARATHYROIDECTOMY      Prior to Admission medications   Medication Sig Start Date End Date Taking? Authorizing Provider  allopurinol (ZYLOPRIM) 300 MG tablet Take 300 mg daily by mouth.    [provider]  amLODipine (NORVASC) 5 MG tablet Take 5 mg daily by mouth.    [provider]  aspirin EC 325 MG tablet Take 325 mg daily by mouth.    [provider]  citalopram (CELEXA) 20 MG tablet Take 20 mg daily by  mouth.    [provider]  Cyanocobalamin (VITAMIN B 12 PO) Take 1,000 mcg/mL by mouth.    [provider]  ergocalciferol (VITAMIN D2) 50000 units capsule Take 50,000 Units once a week by mouth.    [provider]  furosemide (LASIX) 20 MG tablet Take 20 mg by mouth.    [provider]  iron polysaccharides (NIFEREX) 150 MG capsule Take 150 mg daily by mouth.    [provider]  olmesartan (BENICAR) 40 MG tablet Take 40 mg daily by mouth.    [provider]  polyethylene glycol-electrolytes (NULYTELY/GOLYTELY) 420 g solution Take 4,000 mLs once by mouth.    [provider]  prochlorperazine (COMPAZINE) 10 MG tablet Take 1 tablet (10 mg total) by mouth every 8 (eight) hours as needed (headache). 02/16/16   Nance Pear, MD     Allergies Codeine; Nsaids; and Oxycodone  Family History  Problem Relation Age of Onset  . Breast cancer Neg Hx     Social History Social History   Tobacco Use  . Smoking status: Never Smoker  . Smokeless tobacco: Never Used  Substance Use Topics  . Alcohol use: Yes    Comment: occ  . Drug use: No    Review of Systems  Constitutional: No fever/chills Eyes: No visual changes.  ENT: Neck pain as above Cardiovascular: No chest wall pain Respiratory: Denies shortness of breath. Gastrointestinal: No abdominal pain.  No nausea, no vomiting.   Genitourinary: Negative for dysuria. Musculoskeletal: As above Skin: Negative for rash. Neurological: Mild headache, no weakness   ____________________________________________   PHYSICAL EXAM:  VITAL SIGNS: ED Triage Vitals  Enc Vitals Group     BP 08/27/18 1553 (!) 176/74     Pulse Rate 08/27/18 1553 73     Resp 08/27/18 1553 18     Temp 08/27/18 1553 98.4 F (36.9 C)     Temp Source 08/27/18 1553 Oral     SpO2 08/27/18 1553 96 %     Weight 08/27/18 1554 90.3 kg (199 lb)     Height 08/27/18 1554 1.575 m (5\' 2" )     Head Circumference --       Peak Flow --      Pain Score 08/27/18 1554 5     Pain Loc --      Pain Edu? --      Excl. in [place]? --     Constitutional: Alert and oriented. No acute distress.  Eyes: Conjunctivae are normal.  Head: Atraumatic.  Nose: No congestion/rhinnorhea. Mouth/Throat: Mucous membranes are moist.   Neck:  Painless ROM, no vertebral tenderness palpation Cardiovascular: Normal rate, regular rhythm. Grossly normal heart sounds.  Good peripheral circulation. Respiratory: Normal respiratory effort.  No retractions. Lungs CTAB. Gastrointestinal: Soft and nontender. No distention.  No CVA tenderness. Genitourinary: deferred Musculoskeletal: Back: Mild area of swelling at approximately L2, mildly tender, no bony abnormalities palpated.  Warm and well perfused.  Normal strength in the lower extremities and upper extremities Neurologic:  Normal speech and language. No gross focal neurologic deficits are appreciated.  Skin:  Skin is warm, dry and intact. No rash noted. Psychiatric: Mood and affect are normal. Speech and behavior are normal.  ____________________________________________   LABS (all labs ordered are listed, but only abnormal results are displayed)  Labs Reviewed - No data to display ____________________________________________  EKG   ____________________________________________  RADIOLOGY  CT head cervical spine unremarkable Lumbar spine negative for fracture ____________________________________________   PROCEDURES  Procedure(s) performed: No  Procedures   Critical Care performed: No ____________________________________________   INITIAL IMPRESSION / ASSESSMENT AND PLAN / ED COURSE  Pertinent labs & imaging results that were available during my care of the patient were reviewed by me and considered in my medical decision making (see chart for details).  Patient well-appearing in no acute distress CT imaging is reassuring.  Lumbar spine x-ray negative for  fracture.  She does have a contusion to her low back which I recommend supportive care for.  He is reassured by these results.  Appropriate for discharge with outpatient follow-up at this time    ____________________________________________   FINAL CLINICAL IMPRESSION(S) / ED DIAGNOSES  Final diagnoses:  Injury of head, initial encounter  Fall, initial encounter  Contusion of lower back, initial encounter        Note:  This document was prepared using Dragon voice recognition software and may include unintentional dictation errors.    Lavonia Drafts, MD 08/27/18 2120

## 2019-02-19 ENCOUNTER — Other Ambulatory Visit: Payer: Self-pay | Admitting: Internal Medicine

## 2019-02-19 DIAGNOSIS — Z1231 Encounter for screening mammogram for malignant neoplasm of breast: Secondary | ICD-10-CM

## 2019-04-02 ENCOUNTER — Ambulatory Visit
Admission: RE | Admit: 2019-04-02 | Discharge: 2019-04-02 | Disposition: A | Discharge status: Home / Self Care | Payer: Medicare Other | Source: Ambulatory Visit | Attending: Internal Medicine | Admitting: Internal Medicine

## 2019-04-02 DIAGNOSIS — Z1231 Encounter for screening mammogram for malignant neoplasm of breast: Secondary | ICD-10-CM | POA: Diagnosis not present

## 2019-10-26 ENCOUNTER — Ambulatory Visit: Payer: Medicare Other | Attending: Internal Medicine

## 2019-10-26 DIAGNOSIS — Z23 Encounter for immunization: Secondary | ICD-10-CM | POA: Insufficient documentation

## 2019-10-26 NOTE — Progress Notes (Signed)
   Covid-19 Vaccination Clinic  Name:  Gabrielle Savage    MRN: [id number] DOB: 11-27-1945  10/26/2019  [name] was observed post Covid-19 immunization for 15 minutes without incidence. She was provided with Vaccine Information Sheet and instruction to access the V-Safe system.   [name] was instructed to call 911 with any severe reactions post vaccine: Marland Kitchen Difficulty breathing  . Swelling of your face and throat  . A fast heartbeat  . A bad rash all over your body  . Dizziness and weakness    Immunizations Administered    Name Date Dose VIS Date Route   Pfizer COVID-19 Vaccine 10/26/2019 11:45 AM 0.3 mL 08/24/2019 Intramuscular   Manufacturer: [place]   Lot: [id number]   [place]: [id number]

## 2019-11-20 ENCOUNTER — Ambulatory Visit: Payer: Medicare Other | Attending: Internal Medicine

## 2019-11-20 DIAGNOSIS — Z23 Encounter for immunization: Secondary | ICD-10-CM | POA: Insufficient documentation

## 2019-11-20 NOTE — Progress Notes (Signed)
   Covid-19 Vaccination Clinic  Name:  Gabrielle Savage    MRN: [id number] DOB: 1946/06/24  11/20/2019  [name] was observed post Covid-19 immunization for 15 minutes without incident. She was provided with Vaccine Information Sheet and instruction to access the V-Safe system.   [name] was instructed to call 911 with any severe reactions post vaccine: Marland Kitchen Difficulty breathing  . Swelling of face and throat  . A fast heartbeat  . A bad rash all over body  . Dizziness and weakness   Immunizations Administered    Name Date Dose VIS Date Route   Pfizer COVID-19 Vaccine 11/20/2019 12:28 PM 0.3 mL 08/24/2019 Intramuscular   Manufacturer: [place]   Lot: [id number]   [place]: [id number]

## 2020-01-01 DIAGNOSIS — F3340 Major depressive disorder, recurrent, in remission, unspecified: Secondary | ICD-10-CM | POA: Insufficient documentation

## 2020-04-01 ENCOUNTER — Other Ambulatory Visit: Payer: Self-pay | Admitting: Internal Medicine

## 2020-04-01 DIAGNOSIS — Z1231 Encounter for screening mammogram for malignant neoplasm of breast: Secondary | ICD-10-CM

## 2020-04-22 ENCOUNTER — Other Ambulatory Visit: Payer: Self-pay

## 2020-04-22 ENCOUNTER — Ambulatory Visit
Admission: RE | Admit: 2020-04-22 | Discharge: 2020-04-22 | Disposition: A | Discharge status: Home / Self Care | Payer: Medicare Other | Source: Ambulatory Visit | Attending: Internal Medicine | Admitting: Internal Medicine

## 2020-04-22 DIAGNOSIS — Z1231 Encounter for screening mammogram for malignant neoplasm of breast: Secondary | ICD-10-CM | POA: Diagnosis not present

## 2020-10-16 ENCOUNTER — Ambulatory Visit
Admission: RE | Admit: 2020-10-16 | Discharge: 2020-10-16 | Disposition: A | Discharge status: Home / Self Care | Payer: Medicare Other | Source: Ambulatory Visit | Attending: Physician Assistant | Admitting: Physician Assistant

## 2020-10-16 ENCOUNTER — Other Ambulatory Visit: Payer: Self-pay | Admitting: Physician Assistant

## 2020-10-16 ENCOUNTER — Other Ambulatory Visit: Payer: Self-pay

## 2020-10-16 ENCOUNTER — Other Ambulatory Visit (HOSPITAL_COMMUNITY): Payer: Self-pay | Admitting: Physician Assistant

## 2020-10-16 DIAGNOSIS — R42 Dizziness and giddiness: Secondary | ICD-10-CM | POA: Diagnosis present

## 2020-10-16 DIAGNOSIS — M47819 Spondylosis without myelopathy or radiculopathy, site unspecified: Secondary | ICD-10-CM | POA: Diagnosis not present

## 2020-10-16 DIAGNOSIS — M542 Cervicalgia: Secondary | ICD-10-CM

## 2020-10-16 DIAGNOSIS — W19XXXA Unspecified fall, initial encounter: Secondary | ICD-10-CM

## 2020-10-16 DIAGNOSIS — S0990XA Unspecified injury of head, initial encounter: Secondary | ICD-10-CM | POA: Insufficient documentation

## 2020-10-16 DIAGNOSIS — Y92009 Unspecified place in unspecified non-institutional (private) residence as the place of occurrence of the external cause: Secondary | ICD-10-CM | POA: Insufficient documentation

## 2020-10-21 ENCOUNTER — Other Ambulatory Visit: Payer: Self-pay | Admitting: Internal Medicine

## 2020-10-21 DIAGNOSIS — M5020 Other cervical disc displacement, unspecified cervical region: Secondary | ICD-10-CM

## 2020-10-21 DIAGNOSIS — M4802 Spinal stenosis, cervical region: Secondary | ICD-10-CM

## 2020-10-31 ENCOUNTER — Other Ambulatory Visit: Payer: Self-pay

## 2020-10-31 ENCOUNTER — Ambulatory Visit
Admission: RE | Admit: 2020-10-31 | Discharge: 2020-10-31 | Disposition: A | Discharge status: Home / Self Care | Payer: Medicare Other | Source: Ambulatory Visit | Attending: Internal Medicine | Admitting: Internal Medicine

## 2020-10-31 DIAGNOSIS — M4802 Spinal stenosis, cervical region: Secondary | ICD-10-CM | POA: Insufficient documentation

## 2020-10-31 DIAGNOSIS — M5020 Other cervical disc displacement, unspecified cervical region: Secondary | ICD-10-CM | POA: Diagnosis present

## 2021-02-24 ENCOUNTER — Emergency Department: Payer: Medicare Other

## 2021-02-24 ENCOUNTER — Emergency Department
Admission: EM | Admit: 2021-02-24 | Discharge: 2021-02-24 | Disposition: A | Discharge status: Home / Self Care | Payer: Medicare Other | Attending: Emergency Medicine | Admitting: Emergency Medicine

## 2021-02-24 ENCOUNTER — Other Ambulatory Visit: Payer: Self-pay

## 2021-02-24 DIAGNOSIS — N183 Chronic kidney disease, stage 3 unspecified: Secondary | ICD-10-CM | POA: Diagnosis not present

## 2021-02-24 DIAGNOSIS — Z7982 Long term (current) use of aspirin: Secondary | ICD-10-CM | POA: Insufficient documentation

## 2021-02-24 DIAGNOSIS — I129 Hypertensive chronic kidney disease with stage 1 through stage 4 chronic kidney disease, or unspecified chronic kidney disease: Secondary | ICD-10-CM | POA: Diagnosis not present

## 2021-02-24 DIAGNOSIS — S0083XA Contusion of other part of head, initial encounter: Secondary | ICD-10-CM | POA: Insufficient documentation

## 2021-02-24 DIAGNOSIS — W01198A Fall on same level from slipping, tripping and stumbling with subsequent striking against other object, initial encounter: Secondary | ICD-10-CM | POA: Insufficient documentation

## 2021-02-24 DIAGNOSIS — Z96659 Presence of unspecified artificial knee joint: Secondary | ICD-10-CM | POA: Insufficient documentation

## 2021-02-24 DIAGNOSIS — S0990XA Unspecified injury of head, initial encounter: Secondary | ICD-10-CM | POA: Diagnosis present

## 2021-02-24 DIAGNOSIS — W19XXXA Unspecified fall, initial encounter: Secondary | ICD-10-CM

## 2021-02-24 DIAGNOSIS — Z79899 Other long term (current) drug therapy: Secondary | ICD-10-CM | POA: Diagnosis not present

## 2021-02-24 MED ORDER — BUTALBITAL-APAP-CAFFEINE 50-325-40 MG PO TABS: 1.0000 | ORAL_TABLET | Freq: Four times a day (QID) | ORAL | 0 refills | Status: DC | PRN

## 2021-02-24 NOTE — ED Provider Notes (Signed)
[facility] Emergency Department Provider Note  ____________________________________________  Time seen: Approximately 3:49 PM  I have reviewed the triage vital signs and the nursing notes.   HISTORY  Chief Complaint Fall    HPI Gabrielle Savage is a 75 y.o. female who presents the emergency department after a mechanical fall.  Patient states that she was in [place], tripped over the transition between linoleum and carpet.  Patient states that she lost her balance, could not recover and fell striking her head on the sliding door.  Patient has a large hematoma about the right forehead.  She did not lose consciousness.  She has had some headache, nausea but no emesis.  No subsequent loss of consciousness.  Other than area of hematoma on the face she has no pain complaints.  No neck pain, unilateral weakness or numbness or tingling.  No musculoskeletal complaints in the upper or lower extremities.  No lower back pain.  No medications prior to arrival.  Medical history as described below with no complaints of chronic medical issues.       Past Medical History:  Diagnosis Date   Arthritis    Fatigue    Hearing loss of both ears    Hypertension    Iliotibial band syndrome, left leg    OAB (overactive bladder)    Obesity    Renal disorder    STAGE 3   SUI (stress urinary incontinence, female)    Uterovaginal prolapse    Vitamin D deficiency    Voiding dysfunction     There are no problems to display for this patient.   Past Surgical History:  Procedure Laterality Date   ABDOMINAL HYSTERECTOMY     CHOLECYSTECTOMY     COLONOSCOPY N/A 07/25/2017   Procedure: COLONOSCOPY;  Surgeon: [name], MD;  Location: [facility] ENDOSCOPY;  Service: Endoscopy;  Laterality: N/A;   JOINT REPLACEMENT     PARTIAL KNEE REPLACEMENT   KNEE SURGERY     PARATHYROIDECTOMY      Prior to Admission medications   Medication Sig Start Date End Date Taking? Authorizing  Provider  butalbital-acetaminophen-caffeine (FIORICET) 50-325-40 MG tablet Take 1 tablet by mouth every 6 (six) hours as needed for headache. 02/24/21 02/24/22 Yes Yoskar Murrillo, Charline Bills, PA-C  allopurinol (ZYLOPRIM) 300 MG tablet Take 300 mg daily by mouth.    [provider]  amLODipine (NORVASC) 5 MG tablet Take 5 mg daily by mouth.    [provider]  aspirin EC 325 MG tablet Take 325 mg daily by mouth.    [provider]  citalopram (CELEXA) 20 MG tablet Take 20 mg daily by mouth.    [provider]  Cyanocobalamin (VITAMIN B 12 PO) Take 1,000 mcg/mL by mouth.    [provider]  ergocalciferol (VITAMIN D2) 50000 units capsule Take 50,000 Units once a week by mouth.    [provider]  furosemide (LASIX) 20 MG tablet Take 20 mg by mouth.    [provider]  iron polysaccharides (NIFEREX) 150 MG capsule Take 150 mg daily by mouth.    [provider]  olmesartan (BENICAR) 40 MG tablet Take 40 mg daily by mouth.    [provider]  polyethylene glycol-electrolytes (NULYTELY/GOLYTELY) 420 g solution Take 4,000 mLs once by mouth.    [provider]  prochlorperazine (COMPAZINE) 10 MG tablet Take 1 tablet (10 mg total) by mouth every 8 (eight) hours as needed (headache). 02/16/16   Nance Pear, MD  Allergies Codeine, Nsaids, and Oxycodone  Family History  Problem Relation Age of Onset   Breast cancer Neg Hx     Social History Social History   Tobacco Use   Smoking status: Never   Smokeless tobacco: Never  Substance Use Topics   Alcohol use: Yes    Comment: occ   Drug use: No     Review of Systems  Constitutional: No fever/chills.  Positive for mechanical fall with large hematoma to the right forehead Eyes: No visual changes. No discharge ENT: No upper respiratory complaints. Cardiovascular: no chest pain. Respiratory: no cough. No SOB. Gastrointestinal: No abdominal pain.  No nausea,  no vomiting.  No diarrhea.  No constipation. Musculoskeletal: Negative for musculoskeletal pain. Skin: Negative for rash, abrasions, lacerations, ecchymosis. Neurological: Negative for headaches, focal weakness or numbness.  10 System ROS otherwise negative.  ____________________________________________   PHYSICAL EXAM:  VITAL SIGNS: ED Triage Vitals  Enc Vitals Group     BP 02/24/21 1423 (!) 151/74     Pulse Rate 02/24/21 1423 75     Resp 02/24/21 1423 18     Temp 02/24/21 1423 97.6 F (36.4 C)     Temp Source 02/24/21 1423 Oral     SpO2 02/24/21 1423 100 %     Weight 02/24/21 1524 199 lb 1.2 oz (90.3 kg)     Height 02/24/21 1524 5\' 2"  (1.575 m)     Head Circumference --      Peak Flow --      Pain Score 02/24/21 1422 8     Pain Loc --      Pain Edu? --      Excl. in [place]? --      Constitutional: Alert and oriented. Well appearing and in no acute distress. Eyes: Conjunctivae are normal. PERRL. EOMI. Head: Patient with large hematoma to the right frontal skull.  This measures approximately 8 cm in diameter.  Area has a very superficial abrasion over top but no laceration and no active bleeding.  Other than large hematoma there is no other visible signs of trauma with abrasions, lacerations, ecchymosis or other hematomas.  No battle signs, raccoon eyes, serosanguineous fluid drainage from the ears or nares.  Patient is tender over the hematoma but no other tenderness over the osseous structures of the skull or face.  No palpable abnormality or crepitus both around the hematoma or elsewhere. ENT:      Ears:       Nose: No congestion/rhinnorhea.      Mouth/Throat: Mucous membranes are moist.  Neck: No stridor.  No cervical spine tenderness to palpation.  Cardiovascular: Normal rate, regular rhythm. Normal S1 and S2.  Good peripheral circulation. Respiratory: Normal respiratory effort without tachypnea or retractions. Lungs CTAB. Good air entry to the bases with no decreased or  absent breath sounds. Musculoskeletal: Full range of motion to all extremities. No gross deformities appreciated. Neurologic:  Normal speech and language. No gross focal neurologic deficits are appreciated.  Cranial nerves II through XII grossly intact. Skin:  Skin is warm, dry and intact. No rash noted. Psychiatric: Mood and affect are normal. Speech and behavior are normal. Patient exhibits appropriate insight and judgement.   ____________________________________________   LABS (all labs ordered are listed, but only abnormal results are displayed)  Labs Reviewed - No data to display ____________________________________________  EKG   ____________________________________________  RADIOLOGY I personally viewed and evaluated these images as part of my medical decision making, as well as reviewing the  written report by the radiologist.  ED Provider Interpretation: Patient with large hematoma to the right frontal skull otherwise reassuring CT with no skull fracture or intracranial hemorrhage.  Chronic degenerative changes in the cervical spine.  CT Head Wo Contrast  Result Date: 02/24/2021 CLINICAL DATA:  Fall, RIGHT supraorbital hematoma, tripped and fell striking door and cement EXAM: CT HEAD WITHOUT CONTRAST CT MAXILLOFACIAL WITHOUT CONTRAST CT CERVICAL SPINE WITHOUT CONTRAST TECHNIQUE: Multidetector CT imaging of the head, cervical spine, and maxillofacial structures were performed using the standard protocol without intravenous contrast. Multiplanar CT image reconstructions of the cervical spine and maxillofacial structures were also generated. Right side of face marked with BB. COMPARISON:  CT head and cervical spine 10/16/2020 FINDINGS: CT HEAD FINDINGS Brain: Mild atrophy. Normal ventricular morphology. No midline shift or mass effect. Calcifications at the basal ganglia unchanged. Minimal small vessel chronic ischemic changes of deep cerebral white matter. No intracranial hemorrhage,  mass lesion, or evidence of acute infarction. Vascular: Atherosclerotic calcification of internal carotid and vertebral arteries at skull base. No hyperdense vessels. Skull: Intact. Mild hyperostosis frontalis interna. RIGHT frontal scalp hematoma. Other: N/A CT MAXILLOFACIAL FINDINGS Osseous: Visualized calvaria intact. Nasal septal deviation to the RIGHT. Facial bones intact. TMJ alignment normal bilaterally. Orbits: Bony orbits intact. No intraorbital soft tissue abnormalities, fluid, or gas Sinuses: Concha bullosa LEFT middle turbinate. Paranasal sinuses, visualized mastoid air cells, and middle ear cavities clear bilaterally. Soft tissues: RIGHT frontal scalp hematoma. No other facial soft tissue abnormalities. CT CERVICAL SPINE FINDINGS Alignment: Normal Skull base and vertebrae: Osseous demineralization. Skull base intact. Multilevel facet degenerative changes. Disc space narrowing and endplate spur formation at C6-C7 and C7-T1. Vertebral body and remaining disc space heights maintained. No fracture, subluxation, or bone destruction. Soft tissues and spinal canal: Prevertebral soft tissues normal thickness. Atherosclerotic calcifications of internal carotid and vertebral arteries as well as LEFT carotid bifurcation. Disc levels:  No specific abnormality Upper chest: Lung apices clear Other: N/A IMPRESSION: Atrophy with minimal small vessel chronic ischemic changes of deep cerebral white matter. No acute intracranial abnormalities. RIGHT frontal scalp hematoma. No acute facial bone abnormalities. Degenerative disc and facet disease changes of the cervical spine. No acute cervical spine abnormalities. Electronically Signed   By: Lavonia Dana M.D.   On: 02/24/2021 14:57   CT Cervical Spine Wo Contrast  Result Date: 02/24/2021 CLINICAL DATA:  Fall, RIGHT supraorbital hematoma, tripped and fell striking door and cement EXAM: CT HEAD WITHOUT CONTRAST CT MAXILLOFACIAL WITHOUT CONTRAST CT CERVICAL SPINE WITHOUT  CONTRAST TECHNIQUE: Multidetector CT imaging of the head, cervical spine, and maxillofacial structures were performed using the standard protocol without intravenous contrast. Multiplanar CT image reconstructions of the cervical spine and maxillofacial structures were also generated. Right side of face marked with BB. COMPARISON:  CT head and cervical spine 10/16/2020 FINDINGS: CT HEAD FINDINGS Brain: Mild atrophy. Normal ventricular morphology. No midline shift or mass effect. Calcifications at the basal ganglia unchanged. Minimal small vessel chronic ischemic changes of deep cerebral white matter. No intracranial hemorrhage, mass lesion, or evidence of acute infarction. Vascular: Atherosclerotic calcification of internal carotid and vertebral arteries at skull base. No hyperdense vessels. Skull: Intact. Mild hyperostosis frontalis interna. RIGHT frontal scalp hematoma. Other: N/A CT MAXILLOFACIAL FINDINGS Osseous: Visualized calvaria intact. Nasal septal deviation to the RIGHT. Facial bones intact. TMJ alignment normal bilaterally. Orbits: Bony orbits intact. No intraorbital soft tissue abnormalities, fluid, or gas Sinuses: Concha bullosa LEFT middle turbinate. Paranasal sinuses, visualized mastoid air cells, and middle ear  cavities clear bilaterally. Soft tissues: RIGHT frontal scalp hematoma. No other facial soft tissue abnormalities. CT CERVICAL SPINE FINDINGS Alignment: Normal Skull base and vertebrae: Osseous demineralization. Skull base intact. Multilevel facet degenerative changes. Disc space narrowing and endplate spur formation at C6-C7 and C7-T1. Vertebral body and remaining disc space heights maintained. No fracture, subluxation, or bone destruction. Soft tissues and spinal canal: Prevertebral soft tissues normal thickness. Atherosclerotic calcifications of internal carotid and vertebral arteries as well as LEFT carotid bifurcation. Disc levels:  No specific abnormality Upper chest: Lung apices clear  Other: N/A IMPRESSION: Atrophy with minimal small vessel chronic ischemic changes of deep cerebral white matter. No acute intracranial abnormalities. RIGHT frontal scalp hematoma. No acute facial bone abnormalities. Degenerative disc and facet disease changes of the cervical spine. No acute cervical spine abnormalities. Electronically Signed   By: Lavonia Dana M.D.   On: 02/24/2021 14:57   CT Maxillofacial Wo Contrast  Result Date: 02/24/2021 CLINICAL DATA:  Fall, RIGHT supraorbital hematoma, tripped and fell striking door and cement EXAM: CT HEAD WITHOUT CONTRAST CT MAXILLOFACIAL WITHOUT CONTRAST CT CERVICAL SPINE WITHOUT CONTRAST TECHNIQUE: Multidetector CT imaging of the head, cervical spine, and maxillofacial structures were performed using the standard protocol without intravenous contrast. Multiplanar CT image reconstructions of the cervical spine and maxillofacial structures were also generated. Right side of face marked with BB. COMPARISON:  CT head and cervical spine 10/16/2020 FINDINGS: CT HEAD FINDINGS Brain: Mild atrophy. Normal ventricular morphology. No midline shift or mass effect. Calcifications at the basal ganglia unchanged. Minimal small vessel chronic ischemic changes of deep cerebral white matter. No intracranial hemorrhage, mass lesion, or evidence of acute infarction. Vascular: Atherosclerotic calcification of internal carotid and vertebral arteries at skull base. No hyperdense vessels. Skull: Intact. Mild hyperostosis frontalis interna. RIGHT frontal scalp hematoma. Other: N/A CT MAXILLOFACIAL FINDINGS Osseous: Visualized calvaria intact. Nasal septal deviation to the RIGHT. Facial bones intact. TMJ alignment normal bilaterally. Orbits: Bony orbits intact. No intraorbital soft tissue abnormalities, fluid, or gas Sinuses: Concha bullosa LEFT middle turbinate. Paranasal sinuses, visualized mastoid air cells, and middle ear cavities clear bilaterally. Soft tissues: RIGHT frontal scalp  hematoma. No other facial soft tissue abnormalities. CT CERVICAL SPINE FINDINGS Alignment: Normal Skull base and vertebrae: Osseous demineralization. Skull base intact. Multilevel facet degenerative changes. Disc space narrowing and endplate spur formation at C6-C7 and C7-T1. Vertebral body and remaining disc space heights maintained. No fracture, subluxation, or bone destruction. Soft tissues and spinal canal: Prevertebral soft tissues normal thickness. Atherosclerotic calcifications of internal carotid and vertebral arteries as well as LEFT carotid bifurcation. Disc levels:  No specific abnormality Upper chest: Lung apices clear Other: N/A IMPRESSION: Atrophy with minimal small vessel chronic ischemic changes of deep cerebral white matter. No acute intracranial abnormalities. RIGHT frontal scalp hematoma. No acute facial bone abnormalities. Degenerative disc and facet disease changes of the cervical spine. No acute cervical spine abnormalities. Electronically Signed   By: Lavonia Dana M.D.   On: 02/24/2021 14:57    ____________________________________________    PROCEDURES  Procedure(s) performed:    Procedures    Medications - No data to display   ____________________________________________   INITIAL IMPRESSION / ASSESSMENT AND PLAN / ED COURSE  Pertinent labs & imaging results that were available during my care of the patient were reviewed by me and considered in my medical decision making (see chart for details).  Review of the [place] CSRS was performed in accordance of the [place] prior to dispensing any controlled drugs.  Patient's diagnosis is consistent with fall, forehead hematoma.  Patient presented to the emergency department after mechanical fall.  She did strike her head but did not lose consciousness.  She has a rather large hematoma to the right frontal skull with superficial abrasion.  No other visible signs of injury and no other pain complaints.  Imaging of the  head, face and neck was reassuring with no acute traumatic findings other than the appreciated hematoma.  Follow-up instructions discussed with the patient.  Patient is stable for discharge at this time.  No indication for further work-up.  Patient is given ED precautions to return to the ED for any worsening or new symptoms.     ____________________________________________  FINAL CLINICAL IMPRESSION(S) / ED DIAGNOSES  Final diagnoses:  Fall, initial encounter  Traumatic hematoma of forehead, initial encounter      NEW MEDICATIONS STARTED DURING THIS VISIT:  ED Discharge Orders          Ordered    butalbital-acetaminophen-caffeine (FIORICET) 50-325-40 MG tablet  Every 6 hours PRN        02/24/21 1559                This chart was dictated using voice recognition software/Dragon. Despite best efforts to proofread, errors can occur which can change the meaning. Any change was purely unintentional.    Darletta Moll, PA-C 02/24/21 1600    Blake Divine, MD 02/24/21 [phone]

## 2021-02-24 NOTE — ED Notes (Signed)
See triage note   Presents s/p fall  Stats she tripped and hit the door   Large hematoma noted over eye

## 2021-02-24 NOTE — ED Triage Notes (Signed)
Pt comes via EMS with c/o fall with hematoma noted above right eye. Pt denies any LOC or blood thinners.  Pt tripped and fell hitting the door. Pt hit her head hard on the cement.

## 2021-03-14 IMAGING — MG DIGITAL SCREENING BILAT W/ TOMO W/ CAD
6 of 10 series · 6 of 30 positions shown · non-contrast
Comparison: Previous exam(s).

CLINICAL DATA: Screening.

EXAM:
DIGITAL SCREENING BILATERAL MAMMOGRAM WITH TOMO AND CAD

[R MLO synth-2D]
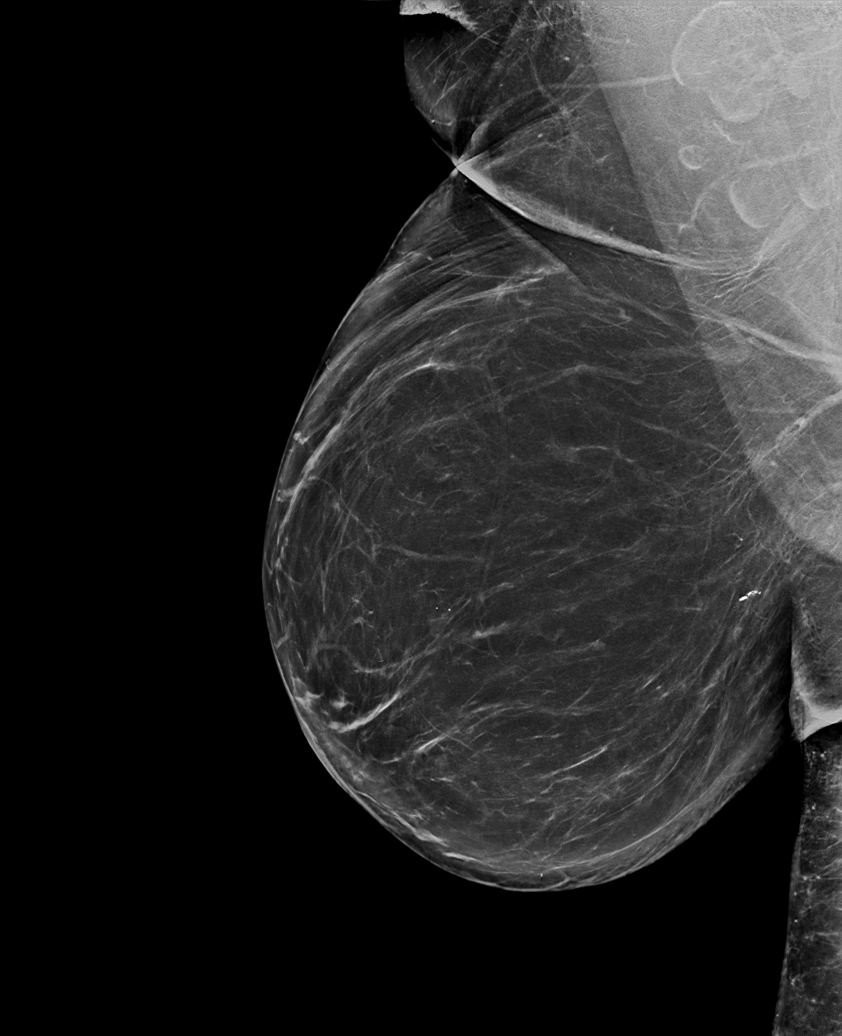

[R CC synth-2D]
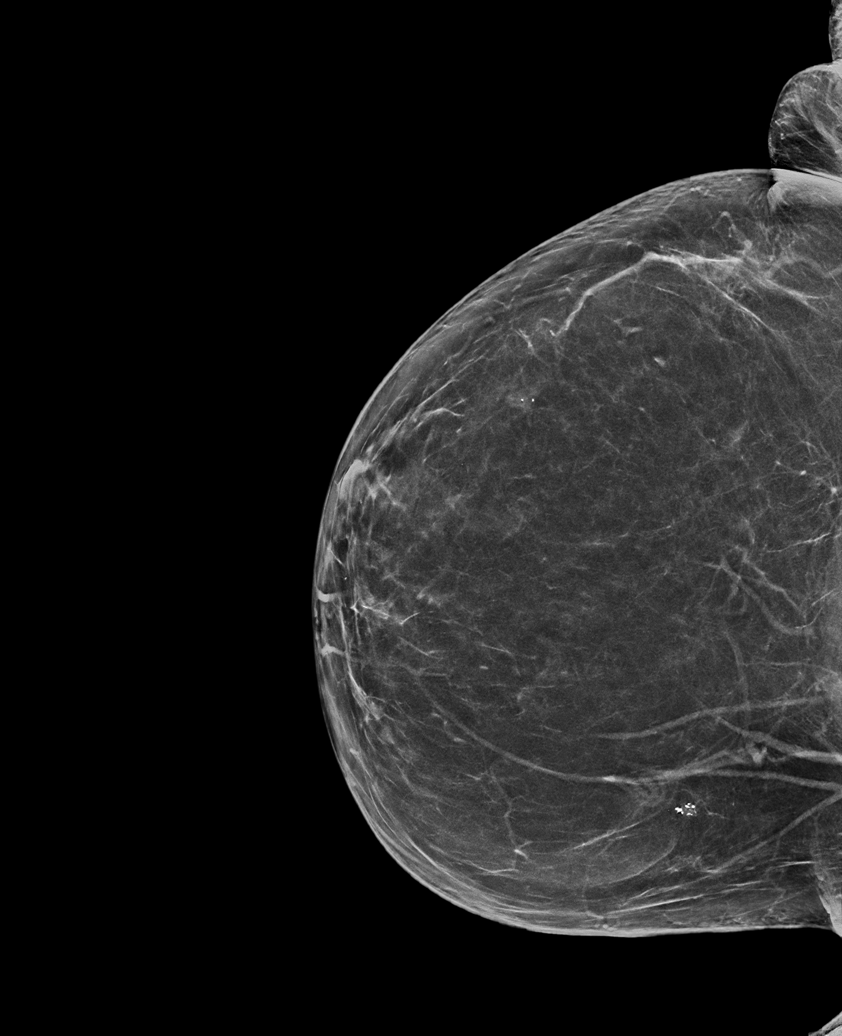

[L MLO synth-2D (1 of 2)]
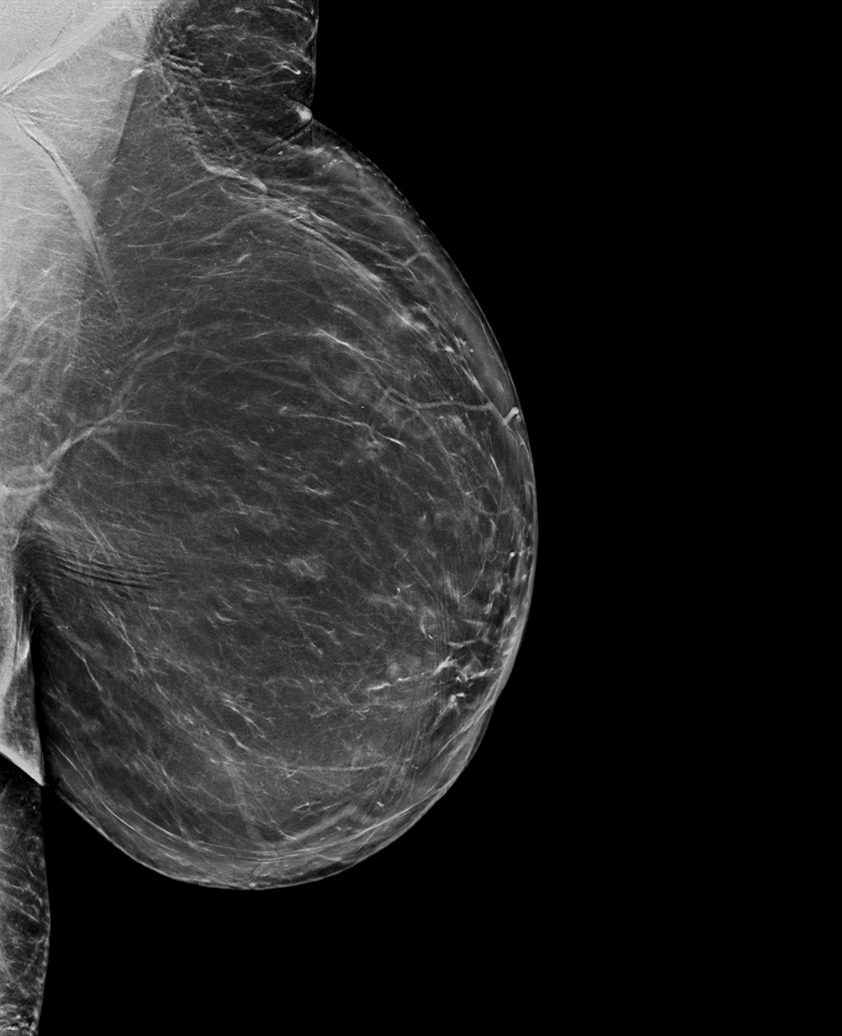

[L MLO synth-2D (2 of 2)]
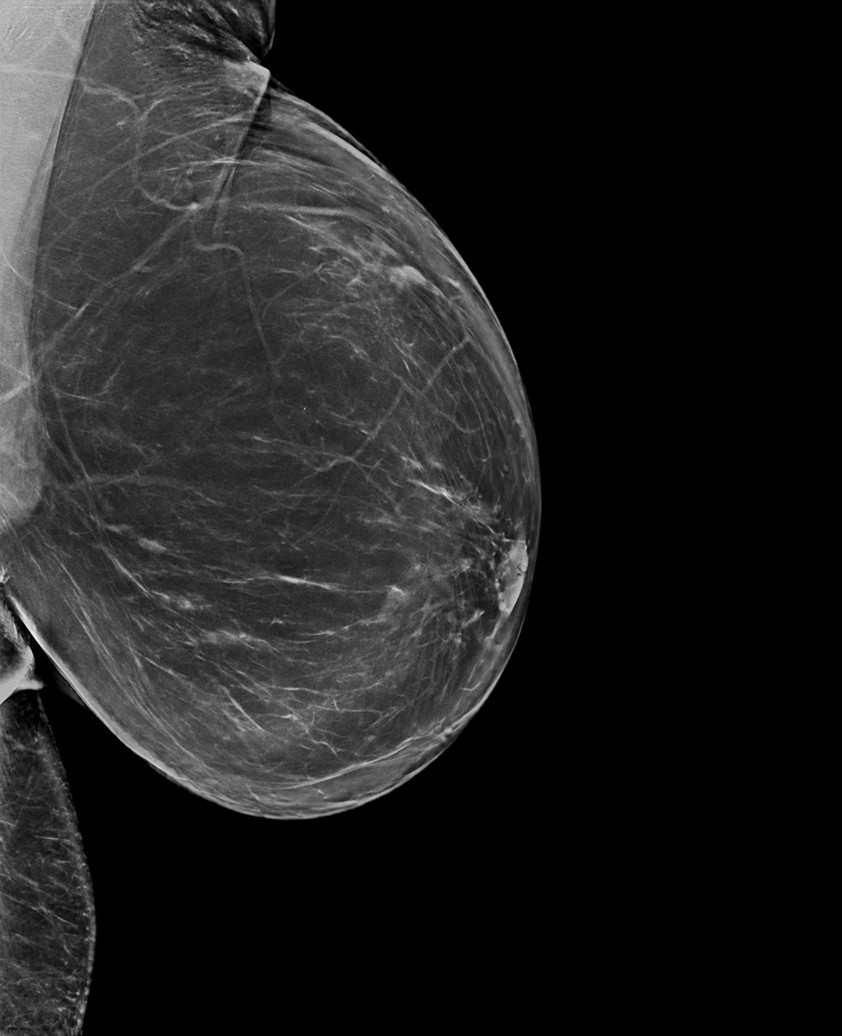

[L CC synth-2D]
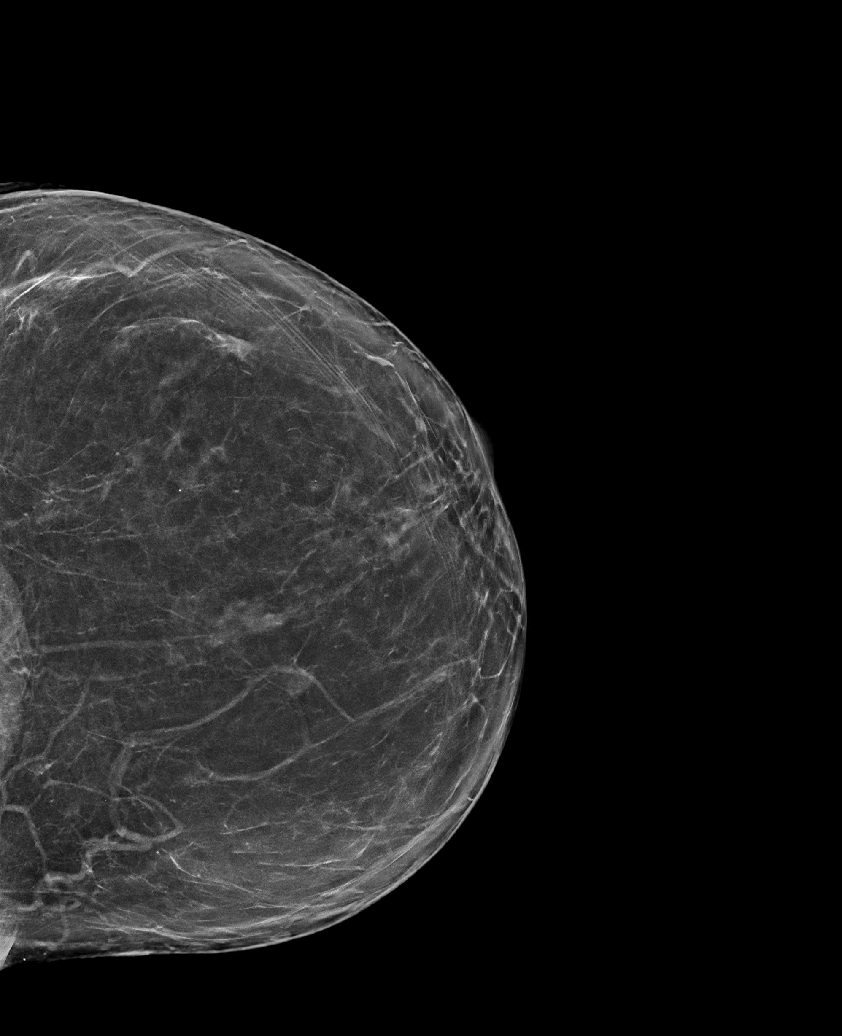

[L CC tomo · tomo slice 41/82.0]
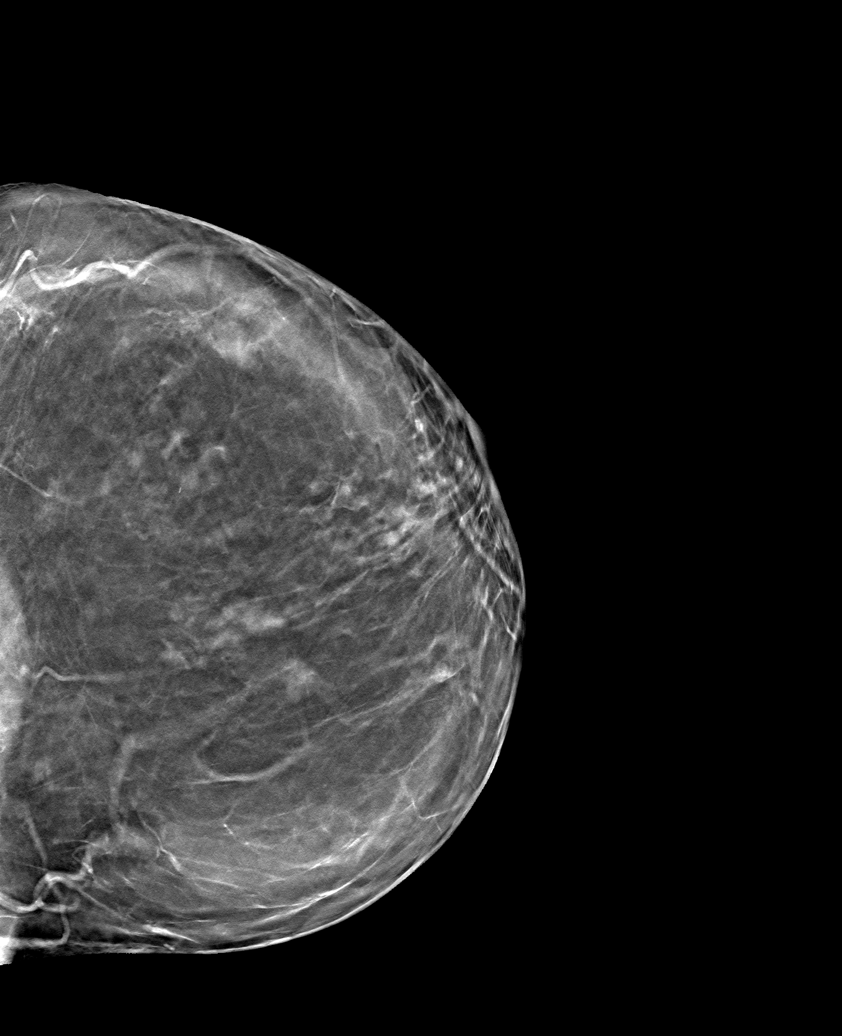

[6 of 30 positions shown; findings below may reference images not displayed]

ACR Breast Density Category b: There are scattered areas of
fibroglandular density.
FINDINGS: There are no findings suspicious for malignancy. Images were
processed with CAD.
IMPRESSION: No mammographic evidence of malignancy. A result letter of this
screening mammogram will be mailed directly to the patient.

RECOMMENDATION:
Screening mammogram in one year. (Code:[id number]-775)

BI-RADS CATEGORY  1: Negative.

## 2021-03-25 ENCOUNTER — Other Ambulatory Visit: Payer: Self-pay | Admitting: Internal Medicine

## 2021-03-25 DIAGNOSIS — Z1231 Encounter for screening mammogram for malignant neoplasm of breast: Secondary | ICD-10-CM

## 2021-04-24 ENCOUNTER — Other Ambulatory Visit: Payer: Self-pay

## 2021-04-24 ENCOUNTER — Ambulatory Visit
Admission: RE | Admit: 2021-04-24 | Discharge: 2021-04-24 | Disposition: A | Discharge status: Home / Self Care | Payer: Medicare Other | Source: Ambulatory Visit | Attending: Internal Medicine | Admitting: Internal Medicine

## 2021-04-24 DIAGNOSIS — Z1231 Encounter for screening mammogram for malignant neoplasm of breast: Secondary | ICD-10-CM | POA: Insufficient documentation

## 2021-04-30 ENCOUNTER — Other Ambulatory Visit: Payer: Self-pay | Admitting: Internal Medicine

## 2021-04-30 DIAGNOSIS — N6489 Other specified disorders of breast: Secondary | ICD-10-CM

## 2021-04-30 DIAGNOSIS — R928 Other abnormal and inconclusive findings on diagnostic imaging of breast: Secondary | ICD-10-CM

## 2021-05-04 ENCOUNTER — Ambulatory Visit
Admission: RE | Admit: 2021-05-04 | Discharge: 2021-05-04 | Disposition: A | Discharge status: Home / Self Care | Payer: Medicare Other | Source: Ambulatory Visit | Attending: Internal Medicine | Admitting: Internal Medicine

## 2021-05-04 ENCOUNTER — Other Ambulatory Visit: Payer: Self-pay

## 2021-05-04 DIAGNOSIS — N6489 Other specified disorders of breast: Secondary | ICD-10-CM | POA: Diagnosis present

## 2021-05-04 DIAGNOSIS — R928 Other abnormal and inconclusive findings on diagnostic imaging of breast: Secondary | ICD-10-CM

## 2021-08-10 DIAGNOSIS — D509 Iron deficiency anemia, unspecified: Secondary | ICD-10-CM | POA: Insufficient documentation

## 2021-08-10 DIAGNOSIS — R748 Abnormal levels of other serum enzymes: Secondary | ICD-10-CM | POA: Insufficient documentation

## 2021-08-19 DIAGNOSIS — R0602 Shortness of breath: Secondary | ICD-10-CM | POA: Insufficient documentation

## 2021-08-19 DIAGNOSIS — E782 Mixed hyperlipidemia: Secondary | ICD-10-CM | POA: Insufficient documentation

## 2021-08-19 DIAGNOSIS — I6523 Occlusion and stenosis of bilateral carotid arteries: Secondary | ICD-10-CM | POA: Insufficient documentation

## 2021-08-19 DIAGNOSIS — R9431 Abnormal electrocardiogram [ECG] [EKG]: Secondary | ICD-10-CM | POA: Insufficient documentation

## 2021-09-03 DIAGNOSIS — I5189 Other ill-defined heart diseases: Secondary | ICD-10-CM

## 2021-09-03 HISTORY — DX: Other ill-defined heart diseases: I51.89

## 2021-10-16 ENCOUNTER — Encounter: Payer: Self-pay | Admitting: *Deleted

## 2021-10-19 ENCOUNTER — Encounter
Admission: RE | Disposition: A | Discharge status: Home / Self Care | Payer: Self-pay | Source: Home / Self Care | Attending: Gastroenterology

## 2021-10-19 ENCOUNTER — Ambulatory Visit
Admission: RE | Admit: 2021-10-19 | Discharge: 2021-10-19 | Disposition: A | Discharge status: Home / Self Care | Payer: Medicare Other | Attending: Gastroenterology | Admitting: Gastroenterology

## 2021-10-19 ENCOUNTER — Encounter: Payer: Self-pay | Admitting: *Deleted

## 2021-10-19 ENCOUNTER — Other Ambulatory Visit: Payer: Self-pay

## 2021-10-19 ENCOUNTER — Ambulatory Visit: Payer: Medicare Other | Admitting: Anesthesiology

## 2021-10-19 DIAGNOSIS — D123 Benign neoplasm of transverse colon: Secondary | ICD-10-CM | POA: Insufficient documentation

## 2021-10-19 DIAGNOSIS — E559 Vitamin D deficiency, unspecified: Secondary | ICD-10-CM | POA: Insufficient documentation

## 2021-10-19 DIAGNOSIS — M1990 Unspecified osteoarthritis, unspecified site: Secondary | ICD-10-CM | POA: Diagnosis not present

## 2021-10-19 DIAGNOSIS — N183 Chronic kidney disease, stage 3 unspecified: Secondary | ICD-10-CM | POA: Diagnosis not present

## 2021-10-19 DIAGNOSIS — K641 Second degree hemorrhoids: Secondary | ICD-10-CM | POA: Insufficient documentation

## 2021-10-19 DIAGNOSIS — D509 Iron deficiency anemia, unspecified: Secondary | ICD-10-CM | POA: Insufficient documentation

## 2021-10-19 DIAGNOSIS — K644 Residual hemorrhoidal skin tags: Secondary | ICD-10-CM | POA: Insufficient documentation

## 2021-10-19 DIAGNOSIS — E669 Obesity, unspecified: Secondary | ICD-10-CM | POA: Diagnosis not present

## 2021-10-19 DIAGNOSIS — Z9049 Acquired absence of other specified parts of digestive tract: Secondary | ICD-10-CM | POA: Insufficient documentation

## 2021-10-19 DIAGNOSIS — K2950 Unspecified chronic gastritis without bleeding: Secondary | ICD-10-CM | POA: Diagnosis not present

## 2021-10-19 DIAGNOSIS — I129 Hypertensive chronic kidney disease with stage 1 through stage 4 chronic kidney disease, or unspecified chronic kidney disease: Secondary | ICD-10-CM | POA: Insufficient documentation

## 2021-10-19 DIAGNOSIS — K5730 Diverticulosis of large intestine without perforation or abscess without bleeding: Secondary | ICD-10-CM | POA: Insufficient documentation

## 2021-10-19 DIAGNOSIS — Z6831 Body mass index (BMI) 31.0-31.9, adult: Secondary | ICD-10-CM | POA: Insufficient documentation

## 2021-10-19 HISTORY — PX: ESOPHAGOGASTRODUODENOSCOPY (EGD) WITH PROPOFOL: SHX5813

## 2021-10-19 HISTORY — PX: COLONOSCOPY WITH PROPOFOL: SHX5780

## 2021-10-19 SURGERY — COLONOSCOPY WITH PROPOFOL
Anesthesia: General

## 2021-10-19 MED ORDER — PROPOFOL 500 MG/50ML IV EMUL
INTRAVENOUS | Status: AC
  Filled 2021-10-19: qty 50

## 2021-10-19 MED ORDER — FENTANYL CITRATE (PF) 100 MCG/2ML IJ SOLN
INTRAMUSCULAR | Status: DC | PRN
  Administered 2021-10-19: 50 ug via INTRAVENOUS
  Administered 2021-10-19 (×2): 25 ug via INTRAVENOUS

## 2021-10-19 MED ORDER — PROPOFOL 10 MG/ML IV BOLUS
INTRAVENOUS | Status: DC | PRN
  Administered 2021-10-19: 30 mg via INTRAVENOUS

## 2021-10-19 MED ORDER — PROPOFOL 500 MG/50ML IV EMUL
INTRAVENOUS | Status: DC | PRN
  Administered 2021-10-19: 50 ug/kg/min via INTRAVENOUS

## 2021-10-19 MED ORDER — PROPOFOL 10 MG/ML IV BOLUS
INTRAVENOUS | Status: AC
  Filled 2021-10-19: qty 20

## 2021-10-19 MED ORDER — MIDAZOLAM HCL 2 MG/2ML IJ SOLN
INTRAMUSCULAR | Status: DC | PRN
  Administered 2021-10-19: 2 mg via INTRAVENOUS

## 2021-10-19 MED ORDER — SODIUM CHLORIDE 0.9 % IV SOLN: INTRAVENOUS | Status: DC

## 2021-10-19 MED ORDER — LIDOCAINE HCL (CARDIAC) PF 100 MG/5ML IV SOSY
PREFILLED_SYRINGE | INTRAVENOUS | Status: DC | PRN
  Administered 2021-10-19: 60 mg via INTRAVENOUS

## 2021-10-19 MED ORDER — MIDAZOLAM HCL 2 MG/2ML IJ SOLN
INTRAMUSCULAR | Status: AC
  Filled 2021-10-19: qty 2

## 2021-10-19 MED ORDER — LIDOCAINE HCL (PF) 2 % IJ SOLN
INTRAMUSCULAR | Status: AC
  Filled 2021-10-19: qty 5

## 2021-10-19 MED ORDER — FENTANYL CITRATE (PF) 100 MCG/2ML IJ SOLN
INTRAMUSCULAR | Status: AC
  Filled 2021-10-19: qty 2

## 2021-10-19 NOTE — Op Note (Signed)
[facility] Gastroenterology Patient Name: Gabrielle Savage Procedure Date: 10/19/2021 8:08 AM MRN: [id number] Account #: [id number] Date of Birth: May 03, 1946 Admit Type: Outpatient Age: 76 Room: [facility] ENDO ROOM 3 Gender: Female Note Status: Finalized Instrument Name: Upper Endoscope [id number] Procedure:             Upper GI endoscopy Indications:           Iron deficiency anemia Providers:             Andrey Farmer MD, MD Referring MD:          Tracie Harrier, MD (Referring MD) Medicines:             Monitored Anesthesia Care Complications:         No immediate complications. Estimated blood loss:                         Minimal. Procedure:             Pre-Anesthesia Assessment:                        - Prior to the procedure, a History and Physical was                         performed, and patient medications and allergies were                         reviewed. The patient is competent. The risks and                         benefits of the procedure and the sedation options and                         risks were discussed with the patient. All questions                         were answered and informed consent was obtained.                         Patient identification and proposed procedure were                         verified by the physician, the nurse, the                         anesthesiologist, the anesthetist and the technician                         in the endoscopy suite. Mental Status Examination:                         alert and oriented. Airway Examination: normal                         oropharyngeal airway and neck mobility. Respiratory                         Examination: clear to auscultation. CV Examination:  normal. Prophylactic Antibiotics: The patient does not                         require prophylactic antibiotics. Prior                         Anticoagulants: The patient has taken no previous                          anticoagulant or antiplatelet agents. ASA Grade                         Assessment: II - A patient with mild systemic disease.                         After reviewing the risks and benefits, the patient                         was deemed in satisfactory condition to undergo the                         procedure. The anesthesia plan was to use monitored                         anesthesia care (MAC). Immediately prior to                         administration of medications, the patient was                         re-assessed for adequacy to receive sedatives. The                         heart rate, respiratory rate, oxygen saturations,                         blood pressure, adequacy of pulmonary ventilation, and                         response to care were monitored throughout the                         procedure. The physical status of the patient was                         re-assessed after the procedure.                        After obtaining informed consent, the endoscope was                         passed under direct vision. Throughout the procedure,                         the patient's blood pressure, pulse, and oxygen                         saturations were monitored continuously. The Endoscope  was introduced through the mouth, and advanced to the                         second part of duodenum. The upper GI endoscopy was                         accomplished without difficulty. The patient tolerated                         the procedure well. Findings:      The examined esophagus was normal.      Patchy minimal inflammation characterized by erythema was found in the       gastric antrum. Biopsies were taken with a cold forceps for Helicobacter       pylori testing. Estimated blood loss was minimal.      The examined duodenum was normal. Biopsies for histology were taken with       a cold forceps for evaluation of celiac disease. Estimated blood  loss       was minimal. Impression:            - Normal esophagus.                        - Gastritis. Biopsied.                        - Normal examined duodenum. Biopsied. Recommendation:        - Discharge patient to home.                        - Resume previous diet.                        - Continue present medications.                        - Await pathology results.                        - Return to referring physician as previously                         scheduled. Procedure Code(s):     --- Professional ---                        [phone], Esophagogastroduodenoscopy, flexible,                         transoral; with biopsy, single or multiple Diagnosis Code(s):     --- Professional ---                        K29.70, Gastritis, unspecified, without bleeding                        D50.9, Iron deficiency anemia, unspecified CPT copyright 2019 [employer]. All rights reserved. The codes documented in this report are preliminary and upon coder review may  be revised to meet current compliance requirements. Andrey Farmer MD, MD 10/19/2021 8:54:00 AM Number of Addenda: 0 Note Initiated On: 10/19/2021 8:08 AM Estimated Blood Loss:  Estimated blood loss was  minimal.      [facility]

## 2021-10-19 NOTE — H&P (Signed)
Outpatient short stay form Pre-procedure 10/19/2021  Gabrielle Rubenstein, MD  Primary Physician: Tracie Harrier, MD  Reason for visit:  IDA  History of present illness:    76 y/o lady with IDA here for EGD/Colonoscopy. No blood thinners. No family history of GI malignancies. History of cholecystectomy. Has medical history of hypertension.    Current Facility-Administered Medications:    0.9 %  sodium chloride infusion, , Intravenous, Continuous, Romney Compean, Hilton Cork, MD, Last Rate: 20 mL/hr at 10/19/21 0750, New Bag at 10/19/21 0750  Medications Prior to Admission  Medication Sig Dispense Refill Last Dose   allopurinol (ZYLOPRIM) 300 MG tablet Take 300 mg daily by mouth.   10/19/2021 at 0600   amLODipine (NORVASC) 5 MG tablet Take 5 mg daily by mouth.   10/19/2021 at 0600   citalopram (CELEXA) 20 MG tablet Take 20 mg daily by mouth.   10/19/2021 at 0600   Cyanocobalamin (VITAMIN B 12 PO) Take 1,000 mcg/mL by mouth.   Past Week   iron polysaccharides (NIFEREX) 150 MG capsule Take 150 mg daily by mouth.   Past Week   olmesartan (BENICAR) 40 MG tablet Take 40 mg daily by mouth.   10/19/2021 at 0600   aspirin EC 325 MG tablet Take 325 mg daily by mouth. (Patient not taking: Reported on 10/19/2021)   Not Taking   butalbital-acetaminophen-caffeine (FIORICET) 50-325-40 MG tablet Take 1 tablet by mouth every 6 (six) hours as needed for headache. (Patient not taking: Reported on 10/19/2021) 20 tablet 0 Completed Course   ergocalciferol (VITAMIN D2) 50000 units capsule Take 50,000 Units once a week by mouth.   10/17/2021   furosemide (LASIX) 20 MG tablet Take 20 mg by mouth. (Patient not taking: Reported on 10/19/2021)   Not Taking   polyethylene glycol-electrolytes (NULYTELY/GOLYTELY) 420 g solution Take 4,000 mLs once by mouth. (Patient not taking: Reported on 10/19/2021)   Completed Course   prochlorperazine (COMPAZINE) 10 MG tablet Take 1 tablet (10 mg total) by mouth every 8 (eight) hours as needed (headache).  (Patient not taking: Reported on 10/19/2021) 20 tablet 0 Not Taking     Allergies  Allergen Reactions   Codeine    Nsaids Other (See Comments)   Oxycodone Nausea Only     Past Medical History:  Diagnosis Date   Arthritis    Fatigue    Hearing loss of both ears    Hypertension    Iliotibial band syndrome, left leg    OAB (overactive bladder)    Obesity    Renal disorder    STAGE 3   SUI (stress urinary incontinence, female)    Uterovaginal prolapse    Vitamin D deficiency    Voiding dysfunction     Review of systems:  Otherwise negative.    Physical Exam  Gen: Alert, oriented. Appears stated age.  HEENT: PERRLA. Lungs: No respiratory distress CV: RRR Abd: soft, benign, no masses Ext: No edema    Planned procedures: Proceed with EGD/colonoscopy. The patient understands the nature of the planned procedure, indications, risks, alternatives and potential complications including but not limited to bleeding, infection, perforation, damage to internal organs and possible oversedation/side effects from anesthesia. The patient agrees and gives consent to proceed.  Please refer to procedure notes for findings, recommendations and patient disposition/instructions.     Gabrielle Rubenstein, MD [facility]

## 2021-10-19 NOTE — Anesthesia Preprocedure Evaluation (Signed)
Anesthesia Evaluation  Patient identified by MRN, date of birth, ID band Patient awake    Reviewed: Allergy & Precautions, NPO status , Patient's Chart, lab work & pertinent test results  History of Anesthesia Complications Negative for: history of anesthetic complications  Airway Mallampati: II  TM Distance: >3 FB Neck ROM: Full    Dental  (+) Dental Advidsory Given   Pulmonary neg pulmonary ROS, neg sleep apnea, neg COPD,    breath sounds clear to auscultation- rhonchi (-) wheezing      Cardiovascular Exercise Tolerance: Good hypertension, Pt. on medications (-) angina(-) CAD, (-) Past MI and (-) Cardiac Stents  Rhythm:Regular Rate:Normal - Systolic murmurs and - Diastolic murmurs    Neuro/Psych negative neurological ROS  negative psych ROS   GI/Hepatic negative GI ROS, Neg liver ROS,   Endo/Other  negative endocrine ROSneg diabetes  Renal/GU Renal InsufficiencyRenal disease     Musculoskeletal  (+) Arthritis ,   Abdominal (+) + obese,   Peds  Hematology  (+) Blood dyscrasia, anemia ,   Anesthesia Other Findings Past Medical History: No date: Arthritis No date: Fatigue No date: Hearing loss of both ears No date: Hypertension No date: Iliotibial band syndrome, left leg No date: OAB (overactive bladder) No date: Obesity No date: Renal disorder     Comment:  STAGE 3 No date: SUI (stress urinary incontinence, female) No date: Uterovaginal prolapse No date: Vitamin D deficiency No date: Voiding dysfunction   Reproductive/Obstetrics                             Anesthesia Physical  Anesthesia Plan  ASA: 3  Anesthesia Plan: General   Post-op Pain Management:    Induction: Intravenous  PONV Risk Score and Plan: 2 and Propofol infusion and TIVA  Airway Management Planned: Natural Airway and Nasal Cannula  Additional Equipment:   Intra-op Plan:   Post-operative Plan:    Informed Consent: I have reviewed the patients History and Physical, chart, labs and discussed the procedure including the risks, benefits and alternatives for the proposed anesthesia with the patient or authorized representative who has indicated his/her understanding and acceptance.     Dental advisory given  Plan Discussed with: CRNA and Anesthesiologist  Anesthesia Plan Comments: (Patient has propofol listed as an allergy because a kidney doctor in the 1990s told her she couldn't have propofol because of her kidney disease. I discussed with her that propofol is safe to use in renal failure, she denies ever having an allergic reaction to propofol, we will proceed cautiously with using propofol for the procedure. )        Anesthesia Quick Evaluation

## 2021-10-19 NOTE — Interval H&P Note (Signed)
History and Physical Interval Note:  10/19/2021 8:03 AM  Gabrielle Savage  has presented today for surgery, with the diagnosis of IDA.  The various methods of treatment have been discussed with the patient and family. After consideration of risks, benefits and other options for treatment, the patient has consented to  Procedure(s) with comments: COLONOSCOPY WITH PROPOFOL (N/A) - DM ESOPHAGOGASTRODUODENOSCOPY (EGD) WITH PROPOFOL (N/A) as a surgical intervention.  The patient's history has been reviewed, patient examined, no change in status, stable for surgery.  I have reviewed the patient's chart and labs.  Questions were answered to the patient's satisfaction.     [name]  Ok to proceed with EGD/Colonoscopy

## 2021-10-19 NOTE — Transfer of Care (Signed)
Immediate Anesthesia Transfer of Care Note  Patient: Gabrielle Savage  Procedure(s) Performed: COLONOSCOPY WITH PROPOFOL ESOPHAGOGASTRODUODENOSCOPY (EGD) WITH PROPOFOL  Patient Location: PACU  Anesthesia Type:General  Level of Consciousness: sedated  Airway & Oxygen Therapy: Patient Spontanous Breathing and Patient connected to nasal cannula oxygen  Post-op Assessment: Report given to RN and Post -op Vital signs reviewed and stable  Post vital signs: Reviewed and stable  Last Vitals:  Vitals Value Taken Time  BP 109/58 10/19/21 0850  Temp    Pulse 59 10/19/21 0850  Resp 14 10/19/21 0850  SpO2 99 % 10/19/21 0850  Vitals shown include unvalidated device data.  Last Pain:  Vitals:   10/19/21 0850  TempSrc: Temporal  PainSc:          Complications: No notable events documented.

## 2021-10-19 NOTE — Op Note (Signed)
[facility] Patient Name: Gabrielle Savage Procedure Date: 10/19/2021 8:07 AM MRN: [id number] Account #: [id number] Date of Birth: Feb 10, 1946 Admit Type: Outpatient Age: 76 Room: [facility] ENDO ROOM 3 Gender: Female Note Status: Finalized Instrument Name: Jasper Riling [id number] Procedure:             Colonoscopy Indications:           Iron deficiency anemia Providers:             Andrey Farmer MD, MD Referring MD:          Tracie Harrier, MD (Referring MD) Medicines:             Monitored Anesthesia Care Complications:         No immediate complications. Estimated blood loss:                         Minimal. Procedure:             Pre-Anesthesia Assessment:                        - Prior to the procedure, a History and Physical was                         performed, and patient medications and allergies were                         reviewed. The patient is competent. The risks and                         benefits of the procedure and the sedation options and                         risks were discussed with the patient. All questions                         were answered and informed consent was obtained.                         Patient identification and proposed procedure were                         verified by the physician, the nurse, the                         anesthesiologist, the anesthetist and the technician                         in the endoscopy suite. Mental Status Examination:                         alert and oriented. Airway Examination: normal                         oropharyngeal airway and neck mobility. Respiratory                         Examination: clear to auscultation. CV Examination:  normal. Prophylactic Antibiotics: The patient does not                         require prophylactic antibiotics. Prior                         Anticoagulants: The patient has taken no previous                          anticoagulant or antiplatelet agents. ASA Grade                         Assessment: II - A patient with mild systemic disease.                         After reviewing the risks and benefits, the patient                         was deemed in satisfactory condition to undergo the                         procedure. The anesthesia plan was to use monitored                         anesthesia care (MAC). Immediately prior to                         administration of medications, the patient was                         re-assessed for adequacy to receive sedatives. The                         heart rate, respiratory rate, oxygen saturations,                         blood pressure, adequacy of pulmonary ventilation, and                         response to care were monitored throughout the                         procedure. The physical status of the patient was                         re-assessed after the procedure.                        After obtaining informed consent, the colonoscope was                         passed under direct vision. Throughout the procedure,                         the patient's blood pressure, pulse, and oxygen                         saturations were monitored continuously. The  Colonoscope was introduced through the anus and                         advanced to the the terminal ileum. The colonoscopy                         was performed without difficulty. The patient                         tolerated the procedure well. The quality of the bowel                         preparation was good. Findings:      The perianal and digital rectal examinations were normal.      The terminal ileum contained a single small diverticulum.      The remainder of the exam in the terminal ileum was normal.      Two sessile polyps were found in the hepatic flexure. The polyps were 2       to 3 mm in size. These polyps were removed with a cold snare. Resection        and retrieval were complete. Estimated blood loss was minimal.      Two sessile polyps were found in the transverse colon. The polyps were 2       to 3 mm in size. These polyps were removed with a cold snare. Resection       and retrieval were complete. Estimated blood loss was minimal.      Three sessile polyps were found in the transverse colon. The polyps were       1 to 2 mm in size. These polyps were removed with a jumbo cold forceps.       Resection and retrieval were complete. Estimated blood loss was minimal.      Multiple small-mouthed diverticula were found in the sigmoid colon,       descending colon and proximal transverse colon.      External and internal hemorrhoids were found during retroflexion. The       hemorrhoids were Grade II (internal hemorrhoids that prolapse but reduce       spontaneously).      The exam was otherwise without abnormality on direct and retroflexion       views. Impression:            - Ileal diverticulum.                        - Two 2 to 3 mm polyps at the hepatic flexure, removed                         with a cold snare. Resected and retrieved.                        - Two 2 to 3 mm polyps in the transverse colon,                         removed with a cold snare. Resected and retrieved.                        - Three 1 to 2 mm polyps in the transverse colon,  removed with a jumbo cold forceps. Resected and                         retrieved.                        - Diverticulosis in the sigmoid colon, in the                         descending colon and in the proximal transverse colon.                        - External and internal hemorrhoids.                        - The examination was otherwise normal on direct and                         retroflexion views. Recommendation:        - Discharge patient to home.                        - Resume previous diet.                        - Continue present medications.                         - Await pathology results.                        - Repeat colonoscopy for surveillance based on                         pathology results.                        - Return to referring physician as previously                         scheduled. Procedure Code(s):     --- Professional ---                        [phone], Colonoscopy, flexible; with removal of                         tumor(s), polyp(s), or other lesion(s) by snare                         technique                        45380, 84, Colonoscopy, flexible; with biopsy, single                         or multiple Diagnosis Code(s):     --- Professional ---                        K64.1, Second degree hemorrhoids                        K63.5, Polyp of  colon                        D50.9, Iron deficiency anemia, unspecified                        K57.50, Diverticulosis of both small and large                         intestine without perforation or abscess without                         bleeding CPT copyright 2019 [employer]. All rights reserved. The codes documented in this report are preliminary and upon coder review may  be revised to meet current compliance requirements. Andrey Farmer MD, MD 10/19/2021 8:50:40 AM Number of Addenda: 0 Note Initiated On: 10/19/2021 8:07 AM Scope Withdrawal Time: 0 hours 13 minutes 5 seconds  Total Procedure Duration: 0 hours 23 minutes 9 seconds  Estimated Blood Loss:  Estimated blood loss was minimal.      [facility]

## 2021-10-20 ENCOUNTER — Encounter: Payer: Self-pay | Admitting: Gastroenterology

## 2021-10-20 LAB — SURGICAL PATHOLOGY

## 2021-10-20 NOTE — Anesthesia Postprocedure Evaluation (Signed)
Anesthesia Post Note  Patient: Gabrielle Savage  Procedure(s) Performed: COLONOSCOPY WITH PROPOFOL ESOPHAGOGASTRODUODENOSCOPY (EGD) WITH PROPOFOL  Patient location during evaluation: Endoscopy Anesthesia Type: General Level of consciousness: awake and alert Pain management: pain level controlled Vital Signs Assessment: post-procedure vital signs reviewed and stable Respiratory status: spontaneous breathing, nonlabored ventilation, respiratory function stable and patient connected to nasal cannula oxygen Cardiovascular status: blood pressure returned to baseline and stable Postop Assessment: no apparent nausea or vomiting Anesthetic complications: no   No notable events documented.   Last Vitals:  Vitals:   10/19/21 0910 10/19/21 0920  BP: 132/67 140/66  Pulse: 60 (!) 59  Resp: 12 10  Temp:    SpO2: 98% 99%    Last Pain:  Vitals:   10/20/21 0736  TempSrc:   PainSc: 0-No pain                 Martha Clan

## 2021-10-27 ENCOUNTER — Other Ambulatory Visit: Payer: Self-pay | Admitting: Nurse Practitioner

## 2021-10-27 ENCOUNTER — Other Ambulatory Visit
Admission: RE | Admit: 2021-10-27 | Discharge: 2021-10-27 | Disposition: A | Discharge status: Home / Self Care | Payer: Medicare Other | Source: Ambulatory Visit | Attending: *Deleted | Admitting: *Deleted

## 2021-10-27 ENCOUNTER — Ambulatory Visit
Admission: RE | Admit: 2021-10-27 | Discharge: 2021-10-27 | Disposition: A | Discharge status: Home / Self Care | Payer: Medicare Other | Source: Ambulatory Visit | Attending: Nurse Practitioner | Admitting: Nurse Practitioner

## 2021-10-27 DIAGNOSIS — D509 Iron deficiency anemia, unspecified: Secondary | ICD-10-CM | POA: Diagnosis not present

## 2021-10-27 DIAGNOSIS — R10816 Epigastric abdominal tenderness: Secondary | ICD-10-CM

## 2021-10-27 DIAGNOSIS — K2930 Chronic superficial gastritis without bleeding: Secondary | ICD-10-CM | POA: Insufficient documentation

## 2021-10-27 DIAGNOSIS — R634 Abnormal weight loss: Secondary | ICD-10-CM | POA: Insufficient documentation

## 2021-10-27 DIAGNOSIS — R7989 Other specified abnormal findings of blood chemistry: Secondary | ICD-10-CM | POA: Insufficient documentation

## 2021-10-27 DIAGNOSIS — Z86010 Personal history of colonic polyps: Secondary | ICD-10-CM | POA: Insufficient documentation

## 2021-10-27 DIAGNOSIS — R1084 Generalized abdominal pain: Secondary | ICD-10-CM | POA: Insufficient documentation

## 2021-10-27 DIAGNOSIS — N2889 Other specified disorders of kidney and ureter: Secondary | ICD-10-CM

## 2021-10-27 HISTORY — DX: Other specified disorders of kidney and ureter: N28.89

## 2021-10-27 LAB — CBC WITH DIFFERENTIAL/PLATELET
Abs Immature Granulocytes: 0.05 10*3/uL (ref 0.00–0.07)
Basophils Absolute: 0 10*3/uL (ref 0.0–0.1)
Basophils Relative: 0 %
Eosinophils Absolute: 0.1 10*3/uL (ref 0.0–0.5)
Eosinophils Relative: 1 %
HCT: 28.7 % — ABNORMAL LOW (ref 36.0–46.0)
Hemoglobin: 8.3 g/dL — ABNORMAL LOW (ref 12.0–15.0)
Immature Granulocytes: 1 %
Lymphocytes Relative: 18 %
Lymphs Abs: 1.4 10*3/uL (ref 0.7–4.0)
MCH: 25.2 pg — ABNORMAL LOW (ref 26.0–34.0)
MCHC: 28.9 g/dL — ABNORMAL LOW (ref 30.0–36.0)
MCV: 87 fL (ref 80.0–100.0)
Monocytes Absolute: 0.8 10*3/uL (ref 0.1–1.0)
Monocytes Relative: 11 %
Neutro Abs: 5.1 10*3/uL (ref 1.7–7.7)
Neutrophils Relative %: 69 %
Platelets: 287 10*3/uL (ref 150–400)
RBC: 3.3 MIL/uL — ABNORMAL LOW (ref 3.87–5.11)
RDW: 15.9 % — ABNORMAL HIGH (ref 11.5–15.5)
WBC: 7.4 10*3/uL (ref 4.0–10.5)
nRBC: 0 % (ref 0.0–0.2)

## 2021-10-27 LAB — POCT I-STAT CREATININE: Creatinine, Ser: 1.4 mg/dL — ABNORMAL HIGH (ref 0.44–1.00)

## 2021-10-27 LAB — COMPREHENSIVE METABOLIC PANEL
ALT: 19 U/L (ref 0–44)
AST: 20 U/L (ref 15–41)
Albumin: 3.4 g/dL — ABNORMAL LOW (ref 3.5–5.0)
Alkaline Phosphatase: 132 U/L — ABNORMAL HIGH (ref 38–126)
Anion gap: 9 (ref 5–15)
BUN: 36 mg/dL — ABNORMAL HIGH (ref 8–23)
CO2: 21 mmol/L — ABNORMAL LOW (ref 22–32)
Calcium: 9.7 mg/dL (ref 8.9–10.3)
Chloride: 105 mmol/L (ref 98–111)
Creatinine, Ser: 1.38 mg/dL — ABNORMAL HIGH (ref 0.44–1.00)
GFR, Estimated: 40 mL/min — ABNORMAL LOW (ref >60)
Glucose, Bld: 124 mg/dL — ABNORMAL HIGH (ref 70–99)
Potassium: 4.3 mmol/L (ref 3.5–5.1)
Sodium: 135 mmol/L (ref 135–145)
Total Bilirubin: 0.4 mg/dL (ref 0.3–1.2)
Total Protein: 8.4 g/dL — ABNORMAL HIGH (ref 6.5–8.1)

## 2021-10-27 LAB — FERRITIN: Ferritin: 732 ng/mL — ABNORMAL HIGH (ref 11–307)

## 2021-10-27 LAB — LIPASE, BLOOD: Lipase: 25 U/L (ref 11–51)

## 2021-10-27 MED ORDER — IOHEXOL 300 MG/ML  SOLN
100.0000 mL | Freq: Once | INTRAMUSCULAR | Status: AC | PRN
  Administered 2021-10-27: 100 mL via INTRAVENOUS

## 2021-10-28 DIAGNOSIS — N2889 Other specified disorders of kidney and ureter: Secondary | ICD-10-CM | POA: Insufficient documentation

## 2021-10-28 NOTE — Progress Notes (Signed)
[facility] CONSULT NOTE  Patient Care Team: Tracie Harrier, MD as PCP - General (Internal Medicine)  CHIEF COMPLAINTS/PURPOSE OF CONSULTATION: Kidney mass    # FEB 14th, 2023- CT AP [GI; [facility]]- . Large mass exophytic extending from lower to mid pole of the RIGHT kidney consistent with renal neoplasm. 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 2. Potential two moieties of the RIGHT kidney. Anatomy distorted by the mass. The mass is exophytic from the upper moiety and more greatly involved in lower moiety. 3. The RIGHT renal vein does  not appear to be involved with tumor. 4. The large RIGHT renal mass abuts but appears contained within the pararenal fascia. 5. Small retroperitoneal lymph nodes are indeterminate for metastatic adenopathy. 6. Recommend non emergent urology surgical consultation.   # CKD III [ sec to HTN > 20 years; none now]; Anemia- EGD/colo- 2023- [[facility]]; Dr.Kowalski [No CAD]  HISTORY OF PRESENTING ILLNESS: Ambulating independently.  Accompanied by family.  Gabrielle Savage 76 y.o.  female with history of chronic kidney disease stage III-was recently evaluated by gastroenterology for anemia/weight loss.  Colonoscopy/EGD negative for any explanation for ongoing patient's anemia.  Patient subsequently underwent a CT scan of the abdomen pelvis that showed large exophytic mass of the right kidney.  Patient is referred to [place] for further evaluation recommendations.   Review of Systems  Constitutional:  Positive for malaise/fatigue and weight loss. Negative for chills and fever.  HENT:  Negative for nosebleeds and sore throat.   Eyes:  Negative for double vision.  Respiratory:  Positive for shortness of breath. Negative for cough, hemoptysis, sputum production and wheezing.   Cardiovascular:  Negative for chest pain, palpitations, orthopnea and leg swelling.  Gastrointestinal:  Negative for abdominal pain, blood in stool, constipation,  diarrhea, heartburn, melena, nausea and vomiting.  Genitourinary:  Negative for dysuria, frequency and urgency.  Musculoskeletal:  Negative for back pain and joint pain.  Skin: Negative.  Negative for itching and rash.  Neurological:  Negative for dizziness, tingling, focal weakness, weakness and headaches.  Endo/Heme/Allergies:  Does not bruise/bleed easily.  Psychiatric/Behavioral:  Negative for depression. The patient is not nervous/anxious and does not have insomnia.     MEDICAL HISTORY:  Past Medical History:  Diagnosis Date   Arthritis    Fatigue    Hearing loss of both ears    Hypertension    Iliotibial band syndrome, left leg    OAB (overactive bladder)    Obesity    Renal disorder    STAGE 3   SUI (stress urinary incontinence, female)    Uterovaginal prolapse    Vitamin D deficiency    Voiding dysfunction     SURGICAL HISTORY: Past Surgical History:  Procedure Laterality Date   ABDOMINAL HYSTERECTOMY     CHOLECYSTECTOMY     COLONOSCOPY N/A 07/25/2017   Procedure: COLONOSCOPY;  Surgeon: [name], MD;  Location: [facility] ENDOSCOPY;  Service: Endoscopy;  Laterality: N/A;   COLONOSCOPY WITH PROPOFOL N/A 10/19/2021   Procedure: COLONOSCOPY WITH PROPOFOL;  Surgeon: [name], MD;  Location: [facility] ENDOSCOPY;  Service: Endoscopy;  Laterality: N/A;  DM   ESOPHAGOGASTRODUODENOSCOPY (EGD) WITH PROPOFOL N/A 10/19/2021   Procedure: ESOPHAGOGASTRODUODENOSCOPY (EGD) WITH PROPOFOL;  Surgeon: [name], MD;  Location: [facility] ENDOSCOPY;  Service: Endoscopy;  Laterality: N/A;   JOINT REPLACEMENT     PARTIAL KNEE REPLACEMENT   KNEE SURGERY     PARATHYROIDECTOMY      SOCIAL HISTORY: Social History  Socioeconomic History   Marital status: Married    Spouse name: Not on file   Number of children: Not on file   Years of education: Not on file   Highest education level: Not on file  Occupational History   Not on file  Tobacco Use   Smoking status: Never    Smokeless tobacco: Never  Vaping Use   Vaping Use: Never used  Substance and Sexual Activity   Alcohol use: Yes    Comment: occ   Drug use: No   Sexual activity: Not Currently  Other Topics Concern   Not on file  Social History Narrative   Lives in [place] - close hospital; with husband; 3 biological children [[place]; [place]; [place]]. Never smoked; glass of wine rare. 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    Social Determinants of Health   Financial Resource Strain: Not on file  Food Insecurity: Not on file  Transportation Needs: Not on file  Physical Activity: Not on file  Stress: Not on file  Social Connections: Not on file  Intimate Partner Violence: Not on file    FAMILY HISTORY: Family History  Problem Relation Age of Onset   Heart failure Mother    Stroke Father    Breast cancer Neg Hx     ALLERGIES:  is allergic to codeine, nsaids, and oxycodone.  MEDICATIONS:  Current Outpatient Medications  Medication Sig Dispense Refill   allopurinol (ZYLOPRIM) 300 MG tablet Take 300 mg daily by mouth.     amLODipine (NORVASC) 5 MG tablet Take 1 tablet by mouth daily.     citalopram (CELEXA) 20 MG tablet Take 20 mg daily by mouth.     cyanocobalamin 1000 MCG tablet Take by mouth.     ergocalciferol (VITAMIN D2) 50000 units capsule Take 50,000 Units once a week by mouth.     ferrous sulfate 325 (65 FE) MG tablet Take by mouth.     irbesartan (AVAPRO) 150 MG tablet Take 1 tablet by mouth daily.     metFORMIN (GLUCOPHAGE) 500 MG tablet Take 500 mg by mouth every morning.     olmesartan (BENICAR) 40 MG tablet Take 40 mg daily by mouth.     olopatadine (PATANOL) 0.1 % ophthalmic solution Apply to eye.     No current facility-administered medications for this visit.      Marland Kitchen  PHYSICAL EXAMINATION:  Vitals:   10/29/21 1412  BP: (!) 141/71  Pulse: 69  Temp: 98.6 F (37 C)  SpO2: 100%   Filed Weights   10/29/21 1412  Weight: 174 lb 9.6 oz (79.2 kg)    Physical  Exam Vitals and nursing note reviewed.  HENT:     Head: Normocephalic and atraumatic.     Mouth/Throat:     Pharynx: Oropharynx is clear.  Eyes:     Extraocular Movements: Extraocular movements intact.     Pupils: Pupils are equal, round, and reactive to light.  Cardiovascular:     Rate and Rhythm: Normal rate and regular rhythm.  Pulmonary:     Comments: Decreased breath sounds bilaterally.  Abdominal:     Palpations: Abdomen is soft.  Musculoskeletal:        General: Normal range of motion.     Cervical back: Normal range of motion.  Skin:    General: Skin is warm.  Neurological:     General: No focal deficit present.     Mental Status: She is alert and oriented to person, place, and time.  Psychiatric:  Behavior: Behavior normal.        Judgment: Judgment normal.     LABORATORY DATA:  I have reviewed the data as listed Lab Results  Component Value Date   WBC 7.4 10/27/2021   HGB 8.3 (L) 10/27/2021   HCT 28.7 (L) 10/27/2021   MCV 87.0 10/27/2021   PLT 287 10/27/2021   Recent Labs    10/27/21 1537 10/27/21 1610  NA 135  --   K 4.3  --   CL 105  --   CO2 21*  --   GLUCOSE 124*  --   BUN 36*  --   CREATININE 1.38* 1.40*  CALCIUM 9.7  --   GFRNONAA 40*  --   PROT 8.4*  --   ALBUMIN 3.4*  --   AST 20  --   ALT 19  --   ALKPHOS 132*  --   BILITOT 0.4  --     RADIOGRAPHIC STUDIES: I have personally reviewed the radiological images as listed and agreed with the findings in the report. CT CHEST WO CONTRAST  Result Date: 10/29/2021 CLINICAL DATA:  Renal cancer with metastasis EXAM: CT CHEST WITHOUT CONTRAST TECHNIQUE: Multidetector CT imaging of the chest was performed following the standard protocol without IV contrast. RADIATION DOSE REDUCTION: This exam was performed according to the departmental dose-optimization program which includes automated exposure control, adjustment of the mA and/or kV according to patient size and/or use of iterative  reconstruction technique. COMPARISON:  None. FINDINGS: Cardiovascular: Heart size is normal. Small pericardial effusion. Coronary artery calcifications noted. Main pulmonary artery is normal caliber. Thoracic aorta is normal in caliber with moderate atherosclerotic plaques. Mediastinum/Nodes: No bulky axillary, hilar or mediastinal lymphadenopathy identified. Lungs/Pleura: 4 mm subpleural pulmonary nodular density in the right lower lobe image 87. No additional nodules identified. No focal consolidation. No pleural effusion or pneumothorax. Upper Abdomen: No acute abnormality. Musculoskeletal: Degenerative changes in the thoracic spine. No suspicious bony lesions identified. IMPRESSION: 1. No definitely suspicious mass or lymphadenopathy identified in the chest. 4 mm subpleural nodule in the right lower lobe. Recommend comparison with any prior CT if available to ensure stability, otherwise consider follow-up in 3-6 months given the history of malignancy. 2. Small pericardial effusion. 3. Coronary artery disease and atherosclerotic disease. Aortic Atherosclerosis (ICD10-I70.0). Electronically Signed   By: Ofilia Neas M.D.   On: 10/29/2021 15:55   CT ABDOMEN PELVIS W CONTRAST  Result Date: 10/27/2021 CLINICAL DATA:  Unintended weight loss. EXAM: CT ABDOMEN AND PELVIS WITH CONTRAST TECHNIQUE: Multidetector CT imaging of the abdomen and pelvis was performed using the standard protocol following bolus administration of intravenous contrast. RADIATION DOSE REDUCTION: This exam was performed according to the departmental dose-optimization program which includes automated exposure control, adjustment of the mA and/or kV according to patient size and/or use of iterative reconstruction technique. CONTRAST:  170m OMNIPAQUE IOHEXOL 300 MG/ML  SOLN COMPARISON:  None FINDINGS: Lower chest: Lung bases are clear. Hepatobiliary: Benign cyst in the RIGHT hepatic lobe. Cholecystectomy. Common bile duct normal caliber.  Pancreas: Pancreas is normal. No ductal dilatation. No pancreatic inflammation. Spleen: Normal spleen Adrenals/urinary tract: Adrenal glands normal. Large ovoid mass extending from the mid and lower pole of the RIGHT kidney. The mass is solid with heterogeneous enhancement measuring 8.3 x 6.8 x 8.1 cm. There appear to be 2 moieties of the RIGHT kidney with the mass intermediate between the two. There is no evidence of thrombus within the RIGHT hepatic veins. The mass does abut the posterior  pararenal surface (image 47/2) but does not appear to extend beyond pararenal fascia. Several rounded small retroperitoneal lymph nodes. For example 8.5 mm node position between the IVC and the aorta (image 42/series 2). Lymph node ventral to the IVC measuring 7 mm image 45/2. LEFT kidney normal with small calcification several nonenhancing cysts. LEFT ureter normal. Bladder normal Stomach/Bowel: Stomach, small-bowel and cecum are normal. The appendix is not identified but there is no pericecal inflammation to suggest appendicitis. The colon and rectosigmoid colon are normal. Vascular/Lymphatic: Abdominal aorta is normal caliber. Post hysterectomy anatomy. Lymphadenopathy. No pelvic lymphadenopathy. Several small periaortic lymph nodes described in the renal section Reproductive: Post hysterectomy.  Adnexa unremarkable Other: No free fluid. Musculoskeletal: No aggressive osseous lesion. IMPRESSION: 1. Large mass exophytic extending from lower to mid pole of the RIGHT kidney consistent with renal neoplasm. 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 2. Potential two moieties of the RIGHT kidney. Anatomy distorted by the mass. The mass is exophytic from the upper moiety and more greatly involved in lower moiety. 3. The RIGHT renal vein does  not appear to be involved with tumor. 4. The large RIGHT renal mass abuts but appears contained within the pararenal fascia. 5. Small retroperitoneal lymph nodes are indeterminate  for metastatic adenopathy. 6. Recommend non emergent urology surgical consultation. These results will be called to the ordering clinician or representative by the Radiologist Assistant, and communication documented in the PACS or [employer]. Electronically Signed   By: Suzy Bouchard M.D.   On: 10/27/2021 16:42   DG Bone Survey Met  Result Date: 10/30/2021 CLINICAL DATA:  History of multiple myeloma EXAM: METASTATIC BONE SURVEY COMPARISON:  CT of the chest and abdomen from earlier this week FINDINGS: Lateral view of the skull shows no lytic or sclerotic lesion. Cervical spine demonstrates multilevel degenerative change without lytic or sclerotic lesion. Thoracic spine demonstrates no compression deformity. Multilevel degenerative changes are noted. No paraspinal mass is seen. Degenerative change of the lumbar spine is noted with retrolisthesis of L3 on L4 and L2 on L3. Lungs are clear.  Ribcage is within normal limits. Upper extremities show degenerative change about the shoulder joints bilaterally. No lytic or sclerotic lesions are seen. Pelvis and lower extremities show degenerative change of the hip joints left greater than right. No lytic or sclerotic lesions are seen. Partial knee joint replacement is noted medially in both knees. IMPRESSION: No lytic or sclerotic lesions are noted. Electronically Signed   By: Inez Catalina M.D.   On: 10/30/2021 22:27    Right kidney mass #Approximately 10 cm right kidney mass highly concerning for malignancy along with subcentimeter retroperitoneal neuropathy indeterminate for malignancy.  No invasion of the renal vein noted.  # I reviewed the imaging findings with the patient and family in detail.  Discussed that imaging findings highly concerning for malignancy-primarily of the kidney.  Recommend urgent evaluation with urology.  If patient does not have any distant metastatic disease-recommend surgical option upfront.  Get bone survey.  I will also get chest  CT scan.  #Anemia-8.3; ferritin elevated; suggestive of-likely secondary to chronic disease/malignancy.  Monitor for now.  #CKD stage III-  #Mildly elevated alkaline phosphatase-question paraneoplastic.  Await x-rays.  Thank you Ms.KimMills, NP for allowing me to participate in the care of your pleasant patient. Please do not hesitate to contact me with questions or concerns in the interim.  Discussed with Dr. Glori Luis.  Plan is to proceed with nephrectomy upfront; based on pathology-patient might be candidate  for adjuvant immunotherapy.   # DISPOSITION: # no labs today # CT chest STAT; bone survey # follow up in 1 week- MD; no labs- Dr.B  # I reviewed the blood work- with the patient in detail; also reviewed the imaging independently [as summarized above]; and with the patient in detail.   All questions were answered. The patient knows to call the clinic with any problems, questions or concerns.     Cammie Sickle, MD 11/05/2021 8:32 PM

## 2021-10-29 ENCOUNTER — Ambulatory Visit
Admission: RE | Admit: 2021-10-29 | Discharge: 2021-10-29 | Disposition: A | Discharge status: Home / Self Care | Payer: Medicare Other | Source: Ambulatory Visit | Attending: Internal Medicine | Admitting: Internal Medicine

## 2021-10-29 ENCOUNTER — Inpatient Hospital Stay: Payer: Medicare Other | Attending: Internal Medicine | Admitting: Internal Medicine

## 2021-10-29 ENCOUNTER — Inpatient Hospital Stay: Payer: Medicare Other

## 2021-10-29 ENCOUNTER — Encounter: Payer: Self-pay | Admitting: Internal Medicine

## 2021-10-29 ENCOUNTER — Other Ambulatory Visit: Payer: Self-pay

## 2021-10-29 ENCOUNTER — Ambulatory Visit
Admission: RE | Admit: 2021-10-29 | Discharge: 2021-10-29 | Disposition: A | Discharge status: Home / Self Care | Payer: Medicare Other | Source: Home / Self Care | Attending: Internal Medicine | Admitting: Internal Medicine

## 2021-10-29 VITALS — BP 141/71 | HR 69 | Temp 98.6°F | Ht 62.0 in | Wt 174.6 lb

## 2021-10-29 DIAGNOSIS — G893 Neoplasm related pain (acute) (chronic): Secondary | ICD-10-CM | POA: Insufficient documentation

## 2021-10-29 DIAGNOSIS — C641 Malignant neoplasm of right kidney, except renal pelvis: Secondary | ICD-10-CM

## 2021-10-29 DIAGNOSIS — N2889 Other specified disorders of kidney and ureter: Secondary | ICD-10-CM | POA: Insufficient documentation

## 2021-10-29 DIAGNOSIS — D649 Anemia, unspecified: Secondary | ICD-10-CM | POA: Diagnosis not present

## 2021-10-29 DIAGNOSIS — R5383 Other fatigue: Secondary | ICD-10-CM | POA: Diagnosis not present

## 2021-10-29 DIAGNOSIS — R748 Abnormal levels of other serum enzymes: Secondary | ICD-10-CM | POA: Insufficient documentation

## 2021-10-29 DIAGNOSIS — N183 Chronic kidney disease, stage 3 unspecified: Secondary | ICD-10-CM | POA: Insufficient documentation

## 2021-10-29 NOTE — Assessment & Plan Note (Addendum)
#  Approximately 10 cm right kidney mass highly concerning for malignancy along with subcentimeter retroperitoneal neuropathy indeterminate for malignancy.  No invasion of the renal vein noted.  # I reviewed the imaging findings with the patient and family in detail.  Discussed that imaging findings highly concerning for malignancy-primarily of the kidney.  Recommend urgent evaluation with urology.  If patient does not have any distant metastatic disease-recommend surgical option upfront.  Get bone survey.  I will also get chest CT scan.  #Anemia-8.3; ferritin elevated; suggestive of-likely secondary to chronic disease/malignancy.  Monitor for now.  #CKD stage III-  #Mildly elevated alkaline phosphatase-question paraneoplastic.  Await x-rays.  Thank you Ms.KimMills, NP for allowing me to participate in the care of your pleasant patient. Please do not hesitate to contact me with questions or concerns in the interim.  Discussed with Dr. Glori Luis.  Plan is to proceed with nephrectomy upfront; based on pathology-patient might be candidate for adjuvant immunotherapy.   # DISPOSITION: # no labs today # CT chest STAT; bone survey # follow up in 1 week- MD; no labs- Dr.B  # I reviewed the blood work- with the patient in detail; also reviewed the imaging independently [as summarized above]; and with the patient in detail.

## 2021-11-03 ENCOUNTER — Telehealth: Payer: Self-pay

## 2021-11-03 ENCOUNTER — Other Ambulatory Visit: Payer: Self-pay | Admitting: Urology

## 2021-11-03 ENCOUNTER — Other Ambulatory Visit: Payer: Self-pay

## 2021-11-03 ENCOUNTER — Ambulatory Visit (INDEPENDENT_AMBULATORY_CARE_PROVIDER_SITE_OTHER): Payer: Medicare Other | Admitting: Urology

## 2021-11-03 ENCOUNTER — Encounter: Payer: Self-pay | Admitting: Urology

## 2021-11-03 VITALS — BP 101/56 | HR 88 | Ht 62.0 in | Wt 171.0 lb

## 2021-11-03 DIAGNOSIS — N1832 Chronic kidney disease, stage 3b: Secondary | ICD-10-CM

## 2021-11-03 DIAGNOSIS — N2889 Other specified disorders of kidney and ureter: Secondary | ICD-10-CM | POA: Diagnosis not present

## 2021-11-03 NOTE — Progress Notes (Signed)
11/03/21 9:04 AM   Gabrielle Savage Sep 27, 1945 [id number]  CC: Right renal mass, CKD stage IIIb  HPI: 76 year old female with at least a year of fatigue and decreased energy as well as anemia who has undergone extensive work-up.  Endoscopy with GI was benign, and she ultimately underwent a CT abdomen and pelvis with contrast on 10/27/2021 that showed a large 8 cm enhancing right lower pole renal mass concerning for RCC, as well as some subtle enlarged retroperitoneal lymph nodes.  She was referred to oncology and a CT chest showed no evidence of metastatic disease in the chest.  She denies any flank pain or gross hematuria.  She has a history of stress incontinence treated with a "bladder tack "at [facility] and denies any stress incontinence at this time.  Baseline renal function creatinine 1.38, EGFR 40.  Past surgical history notable for abdominal hysterectomy and open cholecystectomy as a teenager.  PMH: Past Medical History:  Diagnosis Date   Arthritis    Fatigue    Hearing loss of both ears    Hypertension    Iliotibial band syndrome, left leg    OAB (overactive bladder)    Obesity    Renal disorder    STAGE 3   SUI (stress urinary incontinence, female)    Uterovaginal prolapse    Vitamin D deficiency    Voiding dysfunction     Surgical History: Past Surgical History:  Procedure Laterality Date   ABDOMINAL HYSTERECTOMY     CHOLECYSTECTOMY     COLONOSCOPY N/A 07/25/2017   Procedure: COLONOSCOPY;  Surgeon: [name], MD;  Location: [facility] ENDOSCOPY;  Service: Endoscopy;  Laterality: N/A;   COLONOSCOPY WITH PROPOFOL N/A 10/19/2021   Procedure: COLONOSCOPY WITH PROPOFOL;  Surgeon: [name], MD;  Location: [facility] ENDOSCOPY;  Service: Endoscopy;  Laterality: N/A;  DM   ESOPHAGOGASTRODUODENOSCOPY (EGD) WITH PROPOFOL N/A 10/19/2021   Procedure: ESOPHAGOGASTRODUODENOSCOPY (EGD) WITH PROPOFOL;  Surgeon: [name], MD;  Location: [facility] ENDOSCOPY;  Service:  Endoscopy;  Laterality: N/A;   JOINT REPLACEMENT     PARTIAL KNEE REPLACEMENT   KNEE SURGERY     PARATHYROIDECTOMY      Family History: Family History  Problem Relation Age of Onset   Heart failure Mother    Stroke Father    Breast cancer Neg Hx     Social History:  reports that she has never smoked. She has never used smokeless tobacco. She reports current alcohol use. She reports that she does not use drugs.  Physical Exam: BP (!) 101/56 (BP Location: Left Arm, Patient Position: Sitting, Cuff Size: Normal)    Pulse 88    Ht $R'5\' 2"'nF$  (1.575 m)    Wt 171 lb (77.6 kg)    BMI 31.28 kg/m    Constitutional:  Alert and oriented, No acute distress. Cardiovascular: No clubbing, cyanosis, or edema. Respiratory: Normal respiratory effort, no increased work of breathing. GI: Abdomen is soft, nontender, nondistended, no abdominal masses GU: No CVA tenderness  Laboratory Data: Reviewed, see HPI  Pertinent Imaging: I have personally viewed and interpreted the CT abdomen and pelvis and CT chest showing a large 8 cm right lower pole renal mass worrisome for RCC, no evidence of renal vein involvement, and small indeterminate retroperitoneal lymph nodes, no evidence of metastatic disease in the chest.  Assessment & Plan:   76 year old female with fatigue, anemia, CKD stage IIIb with EGFR 40, and newly diagnosed 8 cm right renal mass worrisome for RCC.  There  are some subtle small indeterminate retroperitoneal lymph nodes that could represent metastatic adenopathy versus simply be reactive.  I had a long conversation with the patient and her husband today, and we reviewed the images together in person.  We discussed the likely diagnosis of kidney cancer, most commonly RCC, and that this is typically treated surgically, with new recommendations for considering adjuvant immunotherapy pending pathology findings.  We discussed need for very close surveillance of these lymph nodes in the future, and that if  these continue to enlarge she likely would benefit from adjuvant immunotherapy treatments.  Risks of surgery including bleeding, infection, damage to surrounding structures, ileus, recurrence, 1 to 2-day hospital stay, temporary Foley catheter, post-op pain, all discussed at length.  We reviewed other options like attempted partial nephrectomy, however with her subtle lymphadenopathy and large mass I strongly recommended radical nephrectomy.  I also reached out to Dr. Rogue Bussing with oncology regarding her plan, and he will continue close follow-up regarding imaging and consideration of adjuvant immunotherapy.  Schedule right-sided hand-assisted laparoscopic radical nephrectomy(11/20/2021)  Nickolas Madrid, MD 11/03/2021  [facility] [address] [place],  [address] [phone]

## 2021-11-03 NOTE — Patient Instructions (Addendum)
Kidney Cancer Kidney cancer is an abnormal growth of cells in one or both kidneys. The kidneys filter waste from your blood and produce urine. Kidney cancer may spread to other parts of your body. This type of cancer may also be called renal cell carcinoma. What are the causes? The cause of this condition is not always known. In some cases, abnormal changes to genes (genetic mutations) can cause cells to form cancer. What increases the risk? You may be more likely to develop kidney cancer if you: Are over age 30. The risk increases with age. Have a family history of kidney cancer. Are of African-American, Native American, or Native [place] descent. Smoke. Are female. Are obese. Have high blood pressure (hypertension). Have advanced kidney disease, especially if you need long-term dialysis. Have certain conditions that are passed from parent to child (inherited), such as von Hippel-Lindau disease, tuberous sclerosis, or hereditary papillary renal carcinoma. Have been exposed to certain chemicals. What are the signs or symptoms? In the early stages, kidney cancer does not cause symptoms. As the cancer grows, symptoms may include: Blood in the urine. Pain in the upper back or abdomen, just below the rib cage. You may feel pain on one or both sides of the body. Fatigue. Unexplained weight loss. Fever. How is this diagnosed? This condition may be diagnosed based on: Your symptoms and medical history. A physical exam. Blood and urine tests. X-rays. Imaging tests, such as CT scans, MRIs, and PET scans. Having dye injected into your blood through an IV, and then having X-rays taken of: Your kidneys and the rest of the organs involved in making and storing urine (intravenous pyelogram). Your blood vessels (angiogram). Removal and testing of a kidney tissue sample (biopsy). Your cancer will be assessed (staged), based on how severe it is and how much it has spread. How is this  treated? Treatment depends on the type and stage of the cancer. Treatment may include one or more of the following: Surgery. This may include surgery to remove: Just the tumor (nephron-sparing surgery). The entire kidney (nephrectomy). The kidney, some of the surrounding healthy tissue, nearby lymph nodes, and the adrenal gland in certain cases (radical nephrectomy). Medicines that kill cancer cells (chemotherapy). High-energy rays that kill cancer cells (radiation therapy). Targeted therapy. This targets specific parts of cancer cells and the area around them to block the growth and the spread of the cancer. Targeted therapy can help to limit the damage to healthy cells. Medicines that help your body's disease-fighting system (immune system) fight cancer cells (immunotherapy). Freezing cancer cells using gas or liquid that is delivered through a needle (cryoablation). Destroying cancer cells using high-energy radio waves that are delivered through a needle-like probe (radiofrequency ablation). A procedure to block the artery that supplies blood to the tumor, which kills the cancer cells (embolization). Follow these instructions at home: Eating and drinking Some of your treatments might affect your appetite and your ability to chew and swallow. If you are having problems eating, or if you do not have an appetite, meet with a diet and nutrition specialist (dietitian). If you have side effects that affect eating, it may help to: Eat smaller meals and snacks often. Drink high-nutrition and high-calorie shakes or supplements. Eat bland and soft foods that are easy to eat. Not eat foods that are hot, spicy, or hard to swallow. Lifestyle Do not drink alcohol. Do not use any products that contain nicotine or tobacco, such as cigarettes and e-cigarettes. If you need help  quitting, ask your health care provider. General instructions  Take over-the-counter and prescription medicines only as told by  your health care provider. This includes vitamins, supplements, and herbal products. Consider joining a support group to help you cope with the stress of having kidney cancer. Work with your health care provider to manage any side effects of treatment. Keep all follow-up visits as told by your health care provider. This is important. Where to find more information American Cancer Society: https://www.cancer.[place] ([place]): https://www.cancer.gov Contact a health care provider if you: Notice that you bruise or bleed easily. Are losing weight without trying. Have new or increased fatigue or weakness. Get help right away if you have: Blood in your urine. A sudden increase in pain. A fever. Shortness of breath. Chest pain. Yellow skin or whites of your eyes (jaundice). Summary Kidney cancer is an abnormal growth of cells (tumor) in one or both kidneys. Tumors may spread to other parts of your body. In the early stages, kidney cancer does not cause symptoms. As the cancer grows, symptoms may include blood in the urine, pain in the upper back or abdomen, unexplained weight loss, fatigue, and fever. Treatment depends on the type and stage of the cancer. It may include surgery to remove the tumor, procedures and medicines to kill the cancer cells, or medicines to help your body fight cancer cells. This information is not intended to replace advice given to you by your health care provider. Make sure you discuss any questions you have with your health care provider. Document Revised: 04/21/2021 Document Reviewed: 04/21/2021 Elsevier Patient Education  2022 [place].    Minimally Invasive Nephrectomy Minimally invasive nephrectomy is a surgical procedure to remove a kidney. This procedure is done through several small incisions in the abdomen. You may have surgery to: Remove the entire kidney and some surrounding structures (radical nephrectomy). Remove only the damaged  or diseased part of the kidney (partial nephrectomy). You may need this surgery if: Your kidney is severely damaged from disease, infection, or cancer. You were born with an abnormal kidney. You are donating a healthy kidney. Tell a health care provider about: Any allergies you have. All medicines you are taking, including vitamins, herbs, eye drops, creams, and over-the-counter medicines. Any problems you or family members have had with anesthetic medicines. Any blood disorders you have. Any surgeries you have had. Any medical conditions you have. Whether you are pregnant or may be pregnant. What are the risks? Generally, this is a safe procedure. However, problems may occur, including: Bleeding. Infection. Damage to other body structures near the kidney. Urine leaking into the abdomen. Pneumonia. A blood clot that forms in the leg and travels to the lung (pulmonary embolism). Allergic reactions to medicines. A bone (usually part of a rib) or more tissue may need to be removed so the surgeon can get to the kidney. If this happens, the minimally invasive procedure may need to be changed to an open procedure. What happens before the procedure? Staying hydrated Follow instructions from your health care provider about hydration, which may include: Up to 2 hours before the procedure - you may continue to drink clear liquids, such as water, clear fruit juice, black coffee, and plain tea.  Eating and drinking restrictions Follow instructions from your health care provider about eating and drinking, which may include: 8 hours before the procedure - stop eating heavy meals or foods, such as meat, fried foods, or fatty foods. 6 hours before the procedure -  stop eating light meals or foods, such as toast or cereal. 6 hours before the procedure - stop drinking milk or drinks that contain milk. 2 hours before the procedure - stop drinking clear liquids. Medicines Ask your health care provider  about: Changing or stopping your regular medicines. This is especially important if you are taking diabetes medicines or blood thinners. Taking medicines such as aspirin and ibuprofen. These medicines can thin your blood. Do not take these medicines unless your health care provider tells you to take them. Taking over-the-counter medicines, vitamins, herbs, and supplements. Surgery safety Ask your health care provider: How your surgery site will be marked. What steps will be taken to help prevent infection. These steps may include: Removing hair at the surgery site. Washing skin with a germ-killing soap. Taking antibiotic medicine. General instructions Do not use any products that contain nicotine or tobacco for at least 4 weeks before the procedure. These products include cigarettes, e-cigarettes, and chewing tobacco. If you need help quitting, ask your health care provider. Your health care provider may instruct you to do breathing exercises before the procedure to help prevent pneumonia. Do these exercises as directed. You may need to have your blood drawn (type and cross) in case you need to receive blood (get a transfusion) during the procedure. Plan to have a responsible adult take you home from the hospital or clinic. Plan to have a responsible adult care for you for the time you are told after you leave the hospital or clinic. This is important. What happens during the procedure?   Before surgery begins An IV will be inserted into one of your veins. You will be given a medicine to make you fall asleep (general anesthetic). Your health care provider may take steps to help blood flow in your legs. You may have: Tight stockings, also called compression stockings, on your legs. Compression sleeves wrapped around your legs. A machine called a sequential compression device (SCD) will pump air into the sleeves. A small, thin tube will be placed in your bladder (urinary catheter) to drain  urine. A tube will be placed through your nose and into your stomach (nasogastric tube) to drain stomach fluids. During surgery A small incision will be made in your abdomen to insert a thin, lighted tube (laparoscope) with a camera attached. This lets your surgeon see your kidney during the procedure. More small incisions will be made to insert other surgical tools. One incision may be slightly larger to remove your kidney. In some cases, the surgeon will do the procedure by controlling tools that are attached to a surgical robot. This is called a robot-assisted nephrectomy. The next steps depend on the type of procedure you are having. If you are having a partial nephrectomy: The blood vessels attached to your kidney will be clamped. Part of your kidney will be removed. The kidney will be closed with stitches (sutures), and the clamp on the blood vessels will be removed. If you are having a radical nephrectomy: All of the blood vessels that attach to your kidney will be closed and cut. Part of the tube that carries urine from your kidney to your bladder (ureter) will be removed. Your kidney will be removed. All of the surgical tools will be removed from your body. A small tube (drain) may be placed near one of the incisions to drain extra fluid from the surgery area. The incisions may be closed with sutures or another type of closure. A bandage (dressing) will be  placed over the incision area. The procedure may vary among health care providers and hospitals. What happens after the procedure? Your blood pressure, heart rate, breathing rate, and blood oxygen level will be monitored until you leave the hospital or clinic. Your nasogastric tube will be removed. You may continue to have: An IV until you can drink fluids on your own. A urinary catheter. Your urine output will be checked. A drain. Your surgical drain will be removed after a few days. Your health care provider will tell you how to  care for the drain at home. Pain medicine. This will be given through your IV. Compression stockings or compression sleeves.This helps to prevent blood clots and reduce swelling in your legs. You will be encouraged to: Get out of bed and walk as soon as possible. Do breathing exercises, such as coughing and breathing deeply. This helps prevent pneumonia. Summary Minimally invasive nephrectomy is a surgical procedure to remove a kidney. In some cases, only the damaged or diseased part of the kidney is removed. This procedure is done through several small incisions in the abdomen. Follow instructions from your health care provider about taking medicines and about eating and drinking before the procedure. You will be given a medicine to make you fall asleep (general anesthetic) during the procedure. This information is not intended to replace advice given to you by your health care provider. Make sure you discuss any questions you have with your health care provider. Document Revised: 12/24/2019 Document Reviewed: 12/24/2019 Elsevier Patient Education  2022 [employer].

## 2021-11-03 NOTE — Progress Notes (Signed)
[facility] Posting Form   Surgery Date/Time: Date: 11/20/2021  Surgeon: Dr. Nickolas Madrid, MD  Surgery Location: Day Surgery  Inpt ( Yes  )   Outpt (No)   Obs ( No  )   Diagnosis: Kidney Mass N28.89  -CPT: [phone]  Surgery: Right Hand Assisted Laparoscopic Radial Nephrectomy  Stop Anticoagulations: Yes  Cardiac/Medical/Pulmonary Clearance needed: no  *Orders entered into EPIC  Date: 11/03/21   *Case booked in [place]  Date: 11/03/21  *Notified pt of Surgery: Date: 11/03/21  PRE-OP UA & CX:  No  *Placed into Prior Authorization Work [name] Date: 11/03/21   Assistant/laser/rep:Yes, Dr. Erlene Quan to Assist

## 2021-11-03 NOTE — H&P (View-Only) (Signed)
11/03/21 9:04 AM   Gabrielle Savage 1946/03/16 [id number]  CC: Right renal mass, CKD stage IIIb  HPI: 76 year old female with at least a year of fatigue and decreased energy as well as anemia who has undergone extensive work-up.  Endoscopy with GI was benign, and she ultimately underwent a CT abdomen and pelvis with contrast on 10/27/2021 that showed a large 8 cm enhancing right lower pole renal mass concerning for RCC, as well as some subtle enlarged retroperitoneal lymph nodes.  She was referred to oncology and a CT chest showed no evidence of metastatic disease in the chest.  She denies any flank pain or gross hematuria.  She has a history of stress incontinence treated with a "bladder tack "at [facility] and denies any stress incontinence at this time.  Baseline renal function creatinine 1.38, EGFR 40.  Past surgical history notable for abdominal hysterectomy and open cholecystectomy as a teenager.  PMH: Past Medical History:  Diagnosis Date   Arthritis    Fatigue    Hearing loss of both ears    Hypertension    Iliotibial band syndrome, left leg    OAB (overactive bladder)    Obesity    Renal disorder    STAGE 3   SUI (stress urinary incontinence, female)    Uterovaginal prolapse    Vitamin D deficiency    Voiding dysfunction     Surgical History: Past Surgical History:  Procedure Laterality Date   ABDOMINAL HYSTERECTOMY     CHOLECYSTECTOMY     COLONOSCOPY N/A 07/25/2017   Procedure: COLONOSCOPY;  Surgeon: [name], MD;  Location: [facility] ENDOSCOPY;  Service: Endoscopy;  Laterality: N/A;   COLONOSCOPY WITH PROPOFOL N/A 10/19/2021   Procedure: COLONOSCOPY WITH PROPOFOL;  Surgeon: [name], MD;  Location: [facility] ENDOSCOPY;  Service: Endoscopy;  Laterality: N/A;  DM   ESOPHAGOGASTRODUODENOSCOPY (EGD) WITH PROPOFOL N/A 10/19/2021   Procedure: ESOPHAGOGASTRODUODENOSCOPY (EGD) WITH PROPOFOL;  Surgeon: [name], MD;  Location: [facility] ENDOSCOPY;  Service:  Endoscopy;  Laterality: N/A;   JOINT REPLACEMENT     PARTIAL KNEE REPLACEMENT   KNEE SURGERY     PARATHYROIDECTOMY      Family History: Family History  Problem Relation Age of Onset   Heart failure Mother    Stroke Father    Breast cancer Neg Hx     Social History:  reports that she has never smoked. She has never used smokeless tobacco. She reports current alcohol use. She reports that she does not use drugs.  Physical Exam: BP (!) 101/56 (BP Location: Left Arm, Patient Position: Sitting, Cuff Size: Normal)    Pulse 88    Ht $R'5\' 2"'Ie$  (1.575 m)    Wt 171 lb (77.6 kg)    BMI 31.28 kg/m    Constitutional:  Alert and oriented, No acute distress. Cardiovascular: No clubbing, cyanosis, or edema. Respiratory: Normal respiratory effort, no increased work of breathing. GI: Abdomen is soft, nontender, nondistended, no abdominal masses GU: No CVA tenderness  Laboratory Data: Reviewed, see HPI  Pertinent Imaging: I have personally viewed and interpreted the CT abdomen and pelvis and CT chest showing a large 8 cm right lower pole renal mass worrisome for RCC, no evidence of renal vein involvement, and small indeterminate retroperitoneal lymph nodes, no evidence of metastatic disease in the chest.  Assessment & Plan:   76 year old female with fatigue, anemia, CKD stage IIIb with EGFR 40, and newly diagnosed 8 cm right renal mass worrisome for RCC.  There  are some subtle small indeterminate retroperitoneal lymph nodes that could represent metastatic adenopathy versus simply be reactive.  I had a long conversation with the patient and her husband today, and we reviewed the images together in person.  We discussed the likely diagnosis of kidney cancer, most commonly RCC, and that this is typically treated surgically, with new recommendations for considering adjuvant immunotherapy pending pathology findings.  We discussed need for very close surveillance of these lymph nodes in the future, and that if  these continue to enlarge she likely would benefit from adjuvant immunotherapy treatments.  Risks of surgery including bleeding, infection, damage to surrounding structures, ileus, recurrence, 1 to 2-day hospital stay, temporary Foley catheter, post-op pain, all discussed at length.  We reviewed other options like attempted partial nephrectomy, however with her subtle lymphadenopathy and large mass I strongly recommended radical nephrectomy.  I also reached out to Dr. Rogue Bussing with oncology regarding her plan, and he will continue close follow-up regarding imaging and consideration of adjuvant immunotherapy.  Schedule right-sided hand-assisted laparoscopic radical nephrectomy(11/20/2021)  Gabrielle Madrid, MD 11/03/2021  [facility] [address] [place], [place] [address] [phone]

## 2021-11-03 NOTE — Progress Notes (Signed)
Surgical Physician Order Form [facility] [place]  * Scheduling expectation : March 13?  *Length of Case: 3 hours  *Clearance needed: no  *Anticoagulation Instructions: Hold all anticoagulants  *Aspirin Instructions: Hold Aspirin  *Post-op visit Date/Instructions: 4 weeks wound check and discuss pathology  *Diagnosis: Kidney Mass  *Procedure: right hand-assisted laparoscopic radical nephrectomy   Additional orders: N/A  -Admit type: Observation  -Anesthesia: General  -VTE Prophylaxis Standing Order SCDs   , 5000 units subcutaneous heparin    Other:   -Standing Lab Orders Per Anesthesia    Lab other: PT/INR  -Standing Test orders EKG/Chest x-ray per Anesthesia       Test other:   - Medications:  Ancef 2gm IV  -Other orders:  N/A

## 2021-11-03 NOTE — Telephone Encounter (Signed)
I spoke with Gabrielle Savage and Gabrielle Savage. We have discussed possible surgery dates and Friday March 10th was agreed upon by all parties. Patient given information about surgery date, what to expect pre-operatively and post operatively.  We discussed that a Pre-Admission Testing office will be calling to set up the pre-op visit that will take place prior to surgery, and that these appointments are typically done over the phone with a Pre-Admissions RN.  Informed patient that our office will communicate any additional care to be provided after surgery. Patients questions or concerns were discussed during our call. Advised to call our office should there be any additional information, questions or concerns that arise. Patient verbalized understanding.

## 2021-11-06 ENCOUNTER — Encounter: Payer: Self-pay | Admitting: Internal Medicine

## 2021-11-06 ENCOUNTER — Inpatient Hospital Stay (HOSPITAL_BASED_OUTPATIENT_CLINIC_OR_DEPARTMENT_OTHER): Payer: Medicare Other | Admitting: Internal Medicine

## 2021-11-06 ENCOUNTER — Other Ambulatory Visit: Payer: Self-pay

## 2021-11-06 DIAGNOSIS — N2889 Other specified disorders of kidney and ureter: Secondary | ICD-10-CM | POA: Diagnosis not present

## 2021-11-06 NOTE — Assessment & Plan Note (Addendum)
#  Approximately 10 cm right kidney mass highly concerning for malignancy along with subcentimeter retroperitoneal neuropathy indeterminate for malignancy.  No invasion of the renal vein noted.  Status post evaluation with urology, Dr.Sniinski-awaiting surgery on 3/10.  CT scan chest-4 mm subpleural lung nodule; bone survey-negative for metastatic disease  #I had a Long discussion with the patient and family regarding -after surgery; pathology grade-we will guide is on adjuvant therapy.  I discussed the rationale for adjuvant therapy.  Discussed regarding the possible need for immunotherapy postsurgery.  # Anemia-FEB 2023-8.3; ferritin elevated; suggestive of-likely secondary to chronic disease/malignancy.  Monitor for now.  Hopefully will improve after surgery.  Repeat labs next visit  #CKD stage III- GFR ~40s.  Monitor closely post nephrectomy.  #Mildly elevated alkaline phosphatase-question paraneoplastic-question paraneoplastic.  Monitor for now.   # DISPOSITION: # follow up in 27th week of March 2023-MD; labs- cbc/cmp;- Dr.B  # I reviewed the blood work- with the patient in detail; also reviewed the imaging independently [as summarized above]; and with the patient in detail.

## 2021-11-06 NOTE — Progress Notes (Signed)
[facility] CONSULT NOTE  Patient Care Team: Tracie Harrier, MD as PCP - General (Internal Medicine)  CHIEF COMPLAINTS/PURPOSE OF CONSULTATION: Kidney mass    # FEB 14th, 2023- CT AP [GI; [facility]]- . Large mass exophytic extending from lower to mid pole of the RIGHT kidney consistent with renal neoplasm. [Dr.Sninski; Uorlogy]  # FEB 2023- Right lower lobe subpleural 4 mm lung nodule; bone survey negative   # CKD III [ sec to HTN > 20 years; none now]; Anemia- EGD/colo- 2023- [[facility]-GI]; Dr.Kowalski [No CAD]  HISTORY OF PRESENTING ILLNESS: Ambulating independently.  Accompanied by husband.   Gabrielle Savage 76 y.o.  female with history of chronic kidney disease stage III; right kidney mass is here for follow-up/review imaging.  Patient interim has been evaluated by urology.  She is recommended nephrectomy.  Complaints ongoing fatigue.  Otherwise no new symptoms.  Review of Systems  Constitutional:  Positive for malaise/fatigue and weight loss. Negative for chills and fever.  HENT:  Negative for nosebleeds and sore throat.   Eyes:  Negative for double vision.  Respiratory:  Positive for shortness of breath. Negative for cough, hemoptysis, sputum production and wheezing.   Cardiovascular:  Negative for chest pain, palpitations, orthopnea and leg swelling.  Gastrointestinal:  Negative for abdominal pain, blood in stool, constipation, diarrhea, heartburn, melena, nausea and vomiting.  Genitourinary:  Negative for dysuria, frequency and urgency.  Musculoskeletal:  Negative for back pain and joint pain.  Skin: Negative.  Negative for itching and rash.  Neurological:  Negative for dizziness, tingling, focal weakness, weakness and headaches.  Endo/Heme/Allergies:  Does not bruise/bleed easily.  Psychiatric/Behavioral:  Negative for depression. The patient is not nervous/anxious and does not have insomnia.     MEDICAL HISTORY:  Past Medical History:  Diagnosis Date   Arthritis     Fatigue    Hearing loss of both ears    Hypertension    Iliotibial band syndrome, left leg    OAB (overactive bladder)    Obesity    Renal disorder    STAGE 3   SUI (stress urinary incontinence, female)    Uterovaginal prolapse    Vitamin D deficiency    Voiding dysfunction     SURGICAL HISTORY: Past Surgical History:  Procedure Laterality Date   ABDOMINAL HYSTERECTOMY     CHOLECYSTECTOMY     COLONOSCOPY N/A 07/25/2017   Procedure: COLONOSCOPY;  Surgeon: [name], MD;  Location: [facility] ENDOSCOPY;  Service: Endoscopy;  Laterality: N/A;   COLONOSCOPY WITH PROPOFOL N/A 10/19/2021   Procedure: COLONOSCOPY WITH PROPOFOL;  Surgeon: [name], MD;  Location: [facility] ENDOSCOPY;  Service: Endoscopy;  Laterality: N/A;  DM   ESOPHAGOGASTRODUODENOSCOPY (EGD) WITH PROPOFOL N/A 10/19/2021   Procedure: ESOPHAGOGASTRODUODENOSCOPY (EGD) WITH PROPOFOL;  Surgeon: [name], MD;  Location: [facility] ENDOSCOPY;  Service: Endoscopy;  Laterality: N/A;   JOINT REPLACEMENT     PARTIAL KNEE REPLACEMENT   KNEE SURGERY     PARATHYROIDECTOMY      SOCIAL HISTORY: Social History   Socioeconomic History   Marital status: Married    Spouse name: Not on file   Number of children: Not on file   Years of education: Not on file   Highest education level: Not on file  Occupational History   Not on file  Tobacco Use   Smoking status: Never   Smokeless tobacco: Never  Vaping Use   Vaping Use: Never used  Substance and Sexual Activity   Alcohol use: Yes  Comment: occ   Drug use: No   Sexual activity: Not Currently  Other Topics Concern   Not on file  Social History Narrative   Lives in [place] - close hospital; with husband; 3 biological children [[place]; [place]; [place]]. Never smoked; glass of wine rare. 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    Social Determinants of Health   Financial Resource Strain: Not on file  Food Insecurity: Not on file  Transportation Needs:  Not on file  Physical Activity: Not on file  Stress: Not on file  Social Connections: Not on file  Intimate Partner Violence: Not on file    FAMILY HISTORY: Family History  Problem Relation Age of Onset   Heart failure Mother    Stroke Father    Breast cancer Neg Hx     ALLERGIES:  is allergic to codeine, nsaids, oxycodone, and propofol.  MEDICATIONS:  Current Outpatient Medications  Medication Sig Dispense Refill   allopurinol (ZYLOPRIM) 300 MG tablet Take 300 mg daily by mouth.     amLODipine (NORVASC) 5 MG tablet Take 1 tablet by mouth daily.     citalopram (CELEXA) 20 MG tablet Take 20 mg daily by mouth.     cyanocobalamin 1000 MCG tablet Take by mouth.     ergocalciferol (VITAMIN D2) 50000 units capsule Take 50,000 Units once a week by mouth.     ferrous sulfate 325 (65 FE) MG tablet Take by mouth.     metFORMIN (GLUCOPHAGE) 500 MG tablet Take 500 mg by mouth every morning.     olmesartan (BENICAR) 40 MG tablet Take 40 mg daily by mouth.     olopatadine (PATANOL) 0.1 % ophthalmic solution Apply to eye.     irbesartan (AVAPRO) 150 MG tablet Take 1 tablet by mouth daily. (Patient not taking: Reported on 11/06/2021)     No current facility-administered medications for this visit.      Marland Kitchen  PHYSICAL EXAMINATION:  Vitals:   11/06/21 0900  BP: 126/69  Pulse: 76  Temp: 99.5 F (37.5 C)   Filed Weights   11/06/21 0900  Weight: 173 lb 6.4 oz (78.7 kg)    Physical Exam Vitals and nursing note reviewed.  HENT:     Head: Normocephalic and atraumatic.     Mouth/Throat:     Pharynx: Oropharynx is clear.  Eyes:     Extraocular Movements: Extraocular movements intact.     Pupils: Pupils are equal, round, and reactive to light.  Cardiovascular:     Rate and Rhythm: Normal rate and regular rhythm.  Pulmonary:     Comments: Decreased breath sounds bilaterally.  Abdominal:     Palpations: Abdomen is soft.  Musculoskeletal:        General: Normal range of motion.      Cervical back: Normal range of motion.  Skin:    General: Skin is warm.  Neurological:     General: No focal deficit present.     Mental Status: She is alert and oriented to person, place, and time.  Psychiatric:        Behavior: Behavior normal.        Judgment: Judgment normal.     LABORATORY DATA:  I have reviewed the data as listed Lab Results  Component Value Date   WBC 7.4 10/27/2021   HGB 8.3 (L) 10/27/2021   HCT 28.7 (L) 10/27/2021   MCV 87.0 10/27/2021   PLT 287 10/27/2021   Recent Labs    10/27/21 1537 10/27/21 1610  NA  135  --   K 4.3  --   CL 105  --   CO2 21*  --   GLUCOSE 124*  --   BUN 36*  --   CREATININE 1.38* 1.40*  CALCIUM 9.7  --   GFRNONAA 40*  --   PROT 8.4*  --   ALBUMIN 3.4*  --   AST 20  --   ALT 19  --   ALKPHOS 132*  --   BILITOT 0.4  --     RADIOGRAPHIC STUDIES: I have personally reviewed the radiological images as listed and agreed with the findings in the report. CT CHEST WO CONTRAST  Result Date: 10/29/2021 CLINICAL DATA:  Renal cancer with metastasis EXAM: CT CHEST WITHOUT CONTRAST TECHNIQUE: Multidetector CT imaging of the chest was performed following the standard protocol without IV contrast. RADIATION DOSE REDUCTION: This exam was performed according to the departmental dose-optimization program which includes automated exposure control, adjustment of the mA and/or kV according to patient size and/or use of iterative reconstruction technique. COMPARISON:  None. FINDINGS: Cardiovascular: Heart size is normal. Small pericardial effusion. Coronary artery calcifications noted. Main pulmonary artery is normal caliber. Thoracic aorta is normal in caliber with moderate atherosclerotic plaques. Mediastinum/Nodes: No bulky axillary, hilar or mediastinal lymphadenopathy identified. Lungs/Pleura: 4 mm subpleural pulmonary nodular density in the right lower lobe image 87. No additional nodules identified. No focal consolidation. No pleural  effusion or pneumothorax. Upper Abdomen: No acute abnormality. Musculoskeletal: Degenerative changes in the thoracic spine. No suspicious bony lesions identified. IMPRESSION: 1. No definitely suspicious mass or lymphadenopathy identified in the chest. 4 mm subpleural nodule in the right lower lobe. Recommend comparison with any prior CT if available to ensure stability, otherwise consider follow-up in 3-6 months given the history of malignancy. 2. Small pericardial effusion. 3. Coronary artery disease and atherosclerotic disease. Aortic Atherosclerosis (ICD10-I70.0). Electronically Signed   By: Ofilia Neas M.D.   On: 10/29/2021 15:55   CT ABDOMEN PELVIS W CONTRAST  Result Date: 10/27/2021 CLINICAL DATA:  Unintended weight loss. EXAM: CT ABDOMEN AND PELVIS WITH CONTRAST TECHNIQUE: Multidetector CT imaging of the abdomen and pelvis was performed using the standard protocol following bolus administration of intravenous contrast. RADIATION DOSE REDUCTION: This exam was performed according to the departmental dose-optimization program which includes automated exposure control, adjustment of the mA and/or kV according to patient size and/or use of iterative reconstruction technique. CONTRAST:  165m OMNIPAQUE IOHEXOL 300 MG/ML  SOLN COMPARISON:  None FINDINGS: Lower chest: Lung bases are clear. Hepatobiliary: Benign cyst in the RIGHT hepatic lobe. Cholecystectomy. Common bile duct normal caliber. Pancreas: Pancreas is normal. No ductal dilatation. No pancreatic inflammation. Spleen: Normal spleen Adrenals/urinary tract: Adrenal glands normal. Large ovoid mass extending from the mid and lower pole of the RIGHT kidney. The mass is solid with heterogeneous enhancement measuring 8.3 x 6.8 x 8.1 cm. There appear to be 2 moieties of the RIGHT kidney with the mass intermediate between the two. There is no evidence of thrombus within the RIGHT hepatic veins. The mass does abut the posterior pararenal surface (image  47/2) but does not appear to extend beyond pararenal fascia. Several rounded small retroperitoneal lymph nodes. For example 8.5 mm node position between the IVC and the aorta (image 42/series 2). Lymph node ventral to the IVC measuring 7 mm image 45/2. LEFT kidney normal with small calcification several nonenhancing cysts. LEFT ureter normal. Bladder normal Stomach/Bowel: Stomach, small-bowel and cecum are normal. The appendix is not identified but there  is no pericecal inflammation to suggest appendicitis. The colon and rectosigmoid colon are normal. Vascular/Lymphatic: Abdominal aorta is normal caliber. Post hysterectomy anatomy. Lymphadenopathy. No pelvic lymphadenopathy. Several small periaortic lymph nodes described in the renal section Reproductive: Post hysterectomy.  Adnexa unremarkable Other: No free fluid. Musculoskeletal: No aggressive osseous lesion. IMPRESSION: 1. Large mass exophytic extending from lower to mid pole of the RIGHT kidney consistent with renal neoplasm. 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 2. Potential two moieties of the RIGHT kidney. Anatomy distorted by the mass. The mass is exophytic from the upper moiety and more greatly involved in lower moiety. 3. The RIGHT renal vein does  not appear to be involved with tumor. 4. The large RIGHT renal mass abuts but appears contained within the pararenal fascia. 5. Small retroperitoneal lymph nodes are indeterminate for metastatic adenopathy. 6. Recommend non emergent urology surgical consultation. These results will be called to the ordering clinician or representative by the Radiologist Assistant, and communication documented in the PACS or [employer]. Electronically Signed   By: Suzy Bouchard M.D.   On: 10/27/2021 16:42   DG Bone Survey Met  Result Date: 10/30/2021 CLINICAL DATA:  History of multiple myeloma EXAM: METASTATIC BONE SURVEY COMPARISON:  CT of the chest and abdomen from earlier this week FINDINGS:  Lateral view of the skull shows no lytic or sclerotic lesion. Cervical spine demonstrates multilevel degenerative change without lytic or sclerotic lesion. Thoracic spine demonstrates no compression deformity. Multilevel degenerative changes are noted. No paraspinal mass is seen. Degenerative change of the lumbar spine is noted with retrolisthesis of L3 on L4 and L2 on L3. Lungs are clear.  Ribcage is within normal limits. Upper extremities show degenerative change about the shoulder joints bilaterally. No lytic or sclerotic lesions are seen. Pelvis and lower extremities show degenerative change of the hip joints left greater than right. No lytic or sclerotic lesions are seen. Partial knee joint replacement is noted medially in both knees. IMPRESSION: No lytic or sclerotic lesions are noted. Electronically Signed   By: Inez Catalina M.D.   On: 10/30/2021 22:27    Right kidney mass #Approximately 10 cm right kidney mass highly concerning for malignancy along with subcentimeter retroperitoneal neuropathy indeterminate for malignancy.  No invasion of the renal vein noted.  Status post evaluation with urology, Dr.Sniinski-awaiting surgery on 3/10.  CT scan chest-4 mm subpleural lung nodule; bone survey-negative for metastatic disease  #I had a Long discussion with the patient and family regarding -after surgery; pathology grade-we will guide is on adjuvant therapy.  I discussed the rationale for adjuvant therapy.  Discussed regarding the possible need for immunotherapy postsurgery.  # Anemia-FEB 2023-8.3; ferritin elevated; suggestive of-likely secondary to chronic disease/malignancy.  Monitor for now.  Hopefully will improve after surgery.  Repeat labs next visit  #CKD stage III- GFR ~40s.  Monitor closely post nephrectomy.  #Mildly elevated alkaline phosphatase-question paraneoplastic-question paraneoplastic.  Monitor for now.   # DISPOSITION: # follow up in 27th week of March 2023-MD; labs- cbc/cmp;-  Dr.B  # I reviewed the blood work- with the patient in detail; also reviewed the imaging independently [as summarized above]; and with the patient in detail.   All questions were answered. The patient knows to call the clinic with any problems, questions or concerns.     Cammie Sickle, MD 11/06/2021 10:18 AM

## 2021-11-06 NOTE — Progress Notes (Signed)
Patient denies new problems/concerns today.   °

## 2021-11-12 ENCOUNTER — Other Ambulatory Visit: Payer: Self-pay

## 2021-11-12 ENCOUNTER — Encounter
Admission: RE | Admit: 2021-11-12 | Discharge: 2021-11-12 | Disposition: A | Discharge status: Home / Self Care | Payer: Medicare Other | Source: Ambulatory Visit | Attending: Urology | Admitting: Urology

## 2021-11-12 DIAGNOSIS — D649 Anemia, unspecified: Secondary | ICD-10-CM

## 2021-11-12 DIAGNOSIS — N289 Disorder of kidney and ureter, unspecified: Secondary | ICD-10-CM

## 2021-11-12 DIAGNOSIS — Z01818 Encounter for other preprocedural examination: Secondary | ICD-10-CM

## 2021-11-12 HISTORY — DX: Occlusion and stenosis of unspecified carotid artery: I65.29

## 2021-11-12 HISTORY — DX: Supraventricular tachycardia, unspecified: I47.10

## 2021-11-12 HISTORY — DX: Ventricular premature depolarization: I49.3

## 2021-11-12 HISTORY — DX: Bradycardia, unspecified: R00.1

## 2021-11-12 HISTORY — DX: Other specified disorders of kidney and ureter: N28.89

## 2021-11-12 HISTORY — DX: Supraventricular tachycardia: I47.1

## 2021-11-12 HISTORY — DX: Atrial premature depolarization: I49.1

## 2021-11-12 HISTORY — DX: Type 2 diabetes mellitus without complications: E11.9

## 2021-11-12 HISTORY — DX: Anemia, unspecified: D64.9

## 2021-11-12 HISTORY — DX: Gout, unspecified: M10.9

## 2021-11-12 NOTE — Patient Instructions (Signed)
Your procedure is scheduled on:11-20-21 Friday ?Report to the Registration Desk on the 1st floor of the [facility].Then proceed to the 2nd floor Surgery Desk in the [facility] ?To find out your arrival time, please call [phone] between 1PM - 3PM on:11-19-21 Thursday ? ?REMEMBER: ?Instructions that are not followed completely may result in serious medical risk, up to and including death; or upon the discretion of your surgeon and anesthesiologist your surgery may need to be rescheduled. ? ?Do not eat food OR drink any liquids after midnight the night before surgery.  ?No gum chewing, lozengers or hard candies. ? ?TAKE THESE MEDICATIONS THE MORNING OF SURGERY WITH A SIP OF WATER: ?-allopurinol (ZYLOPRIM) ?-amLODipine (NORVASC)  ?-citalopram (CELEXA) ? ?Stop your metFORMIN (GLUCOPHAGE) 2 days prior to surgery-Last dose on 11-17-21 Tuesday ? ?One week prior to surgery: ?Stop Anti-inflammatories (NSAIDS) such as Advil, Aleve, Ibuprofen, Motrin, Naproxen, Naprosyn and Aspirin based products such as Excedrin, Goodys Powder, BC Powder.You may however, take Tylenol if needed for pain up until the day of surgery. ? ?Stop ANY OVER THE COUNTER supplements/vitamins NOW (11-12-21) until after surgery (vitamin B-12) ? ?No Alcohol for 24 hours before or after surgery. ? ?No Smoking including e-cigarettes for 24 hours prior to surgery.  ?No chewable tobacco products for at least 6 hours prior to surgery.  ?No nicotine patches on the day of surgery. ? ?Do not use any "recreational" drugs for at least a week prior to your surgery.  ?Please be advised that the combination of cocaine and anesthesia may have negative outcomes, up to and including death. ?If you test positive for cocaine, your surgery will be cancelled. ? ?On the morning of surgery brush your teeth with toothpaste and water, you may rinse your mouth with mouthwash if you wish. ?Do not swallow any toothpaste or mouthwash. ? ?Use CHG Soap as directed on instruction  sheet. ? ?Do not wear jewelry, make-up, hairpins, clips or nail polish. ? ?Do not wear lotions, powders, or perfumes.  ? ?Do not shave body from the neck down 48 hours prior to surgery just in case you cut yourself which could leave a site for infection.  ?Also, freshly shaved skin may become irritated if using the CHG soap. ? ?Contact lenses, hearing aids and dentures may not be worn into surgery. ? ?Do not bring valuables to the hospital. [facility] is not responsible for any missing/lost belongings or valuables. ? ?Notify your doctor if there is any change in your medical condition (cold, fever, infection). ? ?Wear comfortable clothing (specific to your surgery type) to the hospital. ? ?After surgery, you can help prevent lung complications by doing breathing exercises.  ?Take deep breaths and cough every 1-2 hours. Your doctor may order a device called an Incentive Spirometer to help you take deep breaths. ?When coughing or sneezing, hold a pillow firmly against your incision with both hands. This is called ?splinting.? Doing this helps protect your incision. It also decreases belly discomfort. ? ?If you are being admitted to the hospital overnight, leave your suitcase in the car. ?After surgery it may be brought to your room. ? ?If you are being discharged the day of surgery, you will not be allowed to drive home. ?You will need a responsible adult (18 years or older) to drive you home and stay with you that night.  ? ?If you are taking public transportation, you will need to have a responsible adult (18 years or older) with you. ?Please confirm with your  physician that it is acceptable to use public transportation.  ? ?Please call the [facility] at [phone] if you have any questions about these instructions. ? ?Surgery Visitation Policy: ? ?Patients undergoing a surgery or procedure may have one family member or support person with them as long as that person is not COVID-19 positive  or experiencing its symptoms.  ?That person may remain in the waiting area during the procedure and may rotate out with other people. ? ?Inpatient Visitation:   ? ?Visiting hours are 7 a.m. to 8 p.m. ?Up to two visitors ages 16+ are allowed at one time in a patient room. The visitors may rotate out with other people during the day. Visitors must check out when they leave, or other visitors will not be allowed. One designated support person may remain overnight. ?The visitor must pass COVID-19 screenings, use hand sanitizer when entering and exiting the patient?s room and wear a mask at all times, including in the patient?s room. ?Patients must also wear a mask when staff or their visitor are in the room. ?Masking is required regardless of vaccination status.  ?

## 2021-11-13 ENCOUNTER — Encounter: Payer: Self-pay | Admitting: Urology

## 2021-11-13 ENCOUNTER — Encounter
Admission: RE | Admit: 2021-11-13 | Discharge: 2021-11-13 | Disposition: A | Discharge status: Home / Self Care | Payer: Medicare Other | Source: Ambulatory Visit | Attending: Urology | Admitting: Urology

## 2021-11-13 DIAGNOSIS — Z01818 Encounter for other preprocedural examination: Secondary | ICD-10-CM

## 2021-11-13 DIAGNOSIS — N289 Disorder of kidney and ureter, unspecified: Secondary | ICD-10-CM | POA: Diagnosis not present

## 2021-11-13 DIAGNOSIS — Z01812 Encounter for preprocedural laboratory examination: Secondary | ICD-10-CM | POA: Insufficient documentation

## 2021-11-13 DIAGNOSIS — D649 Anemia, unspecified: Secondary | ICD-10-CM | POA: Insufficient documentation

## 2021-11-13 DIAGNOSIS — Z87448 Personal history of other diseases of urinary system: Secondary | ICD-10-CM | POA: Diagnosis not present

## 2021-11-13 DIAGNOSIS — N2889 Other specified disorders of kidney and ureter: Secondary | ICD-10-CM

## 2021-11-13 HISTORY — DX: Atherosclerosis of aorta: I70.0

## 2021-11-13 HISTORY — DX: Atherosclerotic heart disease of native coronary artery without angina pectoris: I25.10

## 2021-11-13 LAB — CBC
HCT: 28.2 % — ABNORMAL LOW (ref 36.0–46.0)
Hemoglobin: 8 g/dL — ABNORMAL LOW (ref 12.0–15.0)
MCH: 24.9 pg — ABNORMAL LOW (ref 26.0–34.0)
MCHC: 28.4 g/dL — ABNORMAL LOW (ref 30.0–36.0)
MCV: 87.9 fL (ref 80.0–100.0)
Platelets: 304 10*3/uL (ref 150–400)
RBC: 3.21 MIL/uL — ABNORMAL LOW (ref 3.87–5.11)
RDW: 15.9 % — ABNORMAL HIGH (ref 11.5–15.5)
WBC: 7.7 10*3/uL (ref 4.0–10.5)
nRBC: 0 % (ref 0.0–0.2)

## 2021-11-13 LAB — TYPE AND SCREEN
ABO/RH(D): O NEG
Antibody Screen: NEGATIVE

## 2021-11-13 LAB — PROTIME-INR
INR: 1.1 (ref 0.8–1.2)
Prothrombin Time: 14.4 seconds (ref 11.4–15.2)

## 2021-11-13 NOTE — Progress Notes (Addendum)
?  [facility] ?Perioperative Services: Pre-Admission/Anesthesia Testing ? ?Abnormal Lab Notification ?  ?Date: 11/13/21 ? ?Name: Gabrielle Savage ?MRN:   [id number] ? ?Re: Abnormal labs noted during PAT appointment  ? ?Notified:  ?Provider Name Provider Role Notification Mode  ?Billey Co, MD Urology (Surgeon) Routed and/or faxed via [facility]  ? ?Clinical Information and Notes:  ?ABNORMAL LAB VALUE(S): ?Lab Results  ?Component Value Date  ? HGB 8.0 (L) 11/13/2021  ? HCT 28.2 (L) 11/13/2021  ? ?Gabrielle Savage is scheduled for a RIGHT HAND ASSISTED LAPAROSCOPIC RADICAL NEPHRECTOMY on 11/20/2021.  Patient with a history of anemia.  Ferritin elevated at 732 ng/mL.  Patient has a history of CKD-III.  Additionally recently found to have a 8.3 x 6.8 x 8.1 cm RIGHT kidney mass concerning for probable RCC.  Subcentimeter retroperitoneal adenopathy noted.  Patient with no evidence of thrombus within the RIGHT hepatic vein.  Noted anemia likely secondary to chronic disease and malignancy.  Could consider potential preoperative PRBC transfusion. Will enter order to have CBC rechecked on the morning of her procedure. ? ?This is a Community education officer; no formal response is required. ? ?Honor Loh, MSN, APRN, FNP-C, CEN ?[facility]  ?Peri-operative Services Nurse Practitioner ?Phone: [phone] ?Fax: [phone] ?11/13/21 12:45 PM ? ?

## 2021-11-13 NOTE — Progress Notes (Addendum)
Perioperative Services  Pre-Admission/Anesthesia Testing Clinical Review  Date: 11/13/21  Patient Demographics:  Name: Gabrielle Savage DOB:   1946-09-09 MRN:   [id number]  Planned Surgical Procedure(s):    Case: [id number] Date/Time: 11/20/21 0715   Procedure: HAND ASSISTED LAPAROSCOPIC RADICAL NEPHRECTOMY (Right)   Anesthesia type: General   Pre-op diagnosis: Right Kidney Mass   Location: [facility]   Surgeons: Billey Co, MD   NOTE: Available PAT nursing documentation and vital signs have been reviewed. Clinical nursing staff has updated patient's PMH/PSHx, current medication list, and drug allergies/intolerances to ensure comprehensive history available to assist in medical decision making as it pertains to the aforementioned surgical procedure and anticipated anesthetic course. Extensive review of available clinical information performed. [place] [facility] and PSHx updated with any diagnoses/procedures that  may have been inadvertently omitted during her intake with the pre-admission testing department's nursing staff.  Clinical Discussion:  Gabrielle Savage is a 76 y.o. female who is submitted for pre-surgical anesthesia review and clearance prior to her undergoing the above procedure. Patient has never been a smoker. Pertinent PMH includes: CAD, aortic atherosclerosis, SVT, PACs/PVCs, moderate MV regurgitation, BILATERAL carotid artery stenosis, HTN, HLD, T2DM, DOE, CKD-III, RIGHT kidney mass, IDA, OA, MDD.  Patient is followed by cardiology Nehemiah Massed, MD). She was last seen in the cardiology clinic on 10/28/2021; notes reviewed. At the time of her clinic visit, the patient denied any chest pain, shortness of breath, PND, orthopnea, palpitations, significant peripheral edema, vertiginous symptoms, or presyncope/syncope.  Patient with a PMH significant for cardiovascular diagnoses.  TTE performed on 09/03/2021 revealed a normal left ventricular  systolic function with an EF of >55%.  Diastolic Doppler parameters consistent with abnormal relaxation (G1DD).  Left atrium mildly enlarged.  There was trivial TR and PR, mild AR, and moderate MR.  There was no evidence of a significant transvalvular gradient to suggest aortic or mitral valve stenosis.  Myocardial perfusion imaging study performed on 09/03/2021 revealed a normal left ventricular systolic function with an EF of 63%.  There were no regional wall motion abnormalities.  SPECT images demonstrated no evidence of stress-induced myocardial ischemia or arrhythmia.  Study determined to be normal and low risk.  Blood pressure well controlled at 120/70 on currently prescribed CCB and ARB therapies.  Patient is not on a statin for her HLD diagnosis or ASCVD prevention.  She is not diabetic. Functional capacity, as defined by DASI, is documented as being >/= 4 METS.  No changes were made to her medication regimen.  Patient to follow-up with outpatient cardiology in 12 months or sooner if needed.  Gabrielle Savage recently found to have an RIGHT renal mass on CT imaging performed on 10/27/2021.  Mass measured 8.3 x 6.8 x 8.1 cm.  Morphology favored to be consistent with RCC. Patient is scheduled for a RIGHT HAND ASSISTED LAPAROSCOPIC RADICAL NEPHRECTOMY on 11/20/2021 with Dr. Nickolas Madrid, MD.  Given patient's past medical history significant for cardiovascular diagnoses, presurgical cardiac clearance was sought by the PAT team. "The patient is at the lowest risk possible for perioperative cardiovascular complications with the planned procedure.  The overall risk her procedure is low (<1%).  Currently has no evidence active and/or significant angina and/or congestive heart failure. Patient may proceed to surgery without restriction or need for further cardiovascular testing and an overall LOW risk".  In review of her medication reconciliation, it is noted the patient is not on any type  of anticoagulation  or antiplatelet therapies that would need to be held during the perioperative period.  Patient reports previous perioperative complications with anesthesia in the past. Patient has a PMH (+) for PONV.  She reports that this was a single episode back in 2014.  In review of her surgical records, it appears that patient has not undergone general anesthesia since that time.  All of her subsequent procedures have been performed using propofol.  Order placed by PAT for patient to receive a prophylactic preoperative dose of aprepitant.  In review of the available records, it is noted that patient underwent procedure sedation course using propofol here (ASA III) in 10/2021 without documented complications.   Vitals with BMI 11/06/2021 11/03/2021 10/29/2021  Height - 5\' 2"  5\' 2"   Weight 173 lbs 6 oz 171 lbs 174 lbs 10 oz  BMI 31.71 78.24 23.53  Systolic 614 431 540  Diastolic 69 56 71  Pulse 76 88 69    Providers/Specialists:   NOTE: Primary physician provider listed below. Patient may have been seen by APP or partner within same practice.   PROVIDER ROLE / SPECIALTY LAST Ranae Pila, MD Urology (Surgeon) 11/03/2021  Tracie Harrier, MD Primary Care Provider 07/21/2021  Serafina Royals, MD Cardiology 10/28/2021  Charlaine Dalton, MD Oncology 11/06/2021   Allergies:  Codeine, Nsaids, Oxycodone, and Propofol  Current Home Medications:    allopurinol (ZYLOPRIM) 300 MG tablet   amLODipine (NORVASC) 5 MG tablet   citalopram (CELEXA) 10 MG tablet   ergocalciferol (VITAMIN D2) 50000 units capsule   ferrous sulfate 325 (65 FE) MG tablet   irbesartan (AVAPRO) 150 MG tablet   metFORMIN (GLUCOPHAGE) 500 MG tablet   vitamin B-12 (CYANOCOBALAMIN) 1000 MCG tablet   No current facility-administered medications for this encounter.   History:   Past Medical History:  Diagnosis Date   Aortic atherosclerosis (HCC)    Arthritis    B12 deficiency    Bradycardia    CAD (coronary artery  disease)    Carotid atherosclerosis    CKD (chronic kidney disease), stage III (HCC)    Diastolic dysfunction 08/67/6195   a.)  TTE 09/03/2021: EF 63%; no RWMAs; LA mildly enlarged; trivial TR/PR, mild AR, moderate MR; G1DD.   DOE (dyspnea on exertion)    Fatigue    Gout    HLD (hyperlipidemia)    Hypertension    IDA (iron deficiency anemia)    Iliotibial band syndrome, left leg    MDD (major depressive disorder)    OAB (overactive bladder)    Obesity    PAC (premature atrial contraction)    noted on Holter   PONV (postoperative nausea and vomiting)    a.) single episode in 03/2013   PVC (premature ventricular contraction)    noted on Holter   Right kidney mass 10/27/2021   a.) CT 10/27/2021 -- mass measuring 8.3 x 6.8 x 8.1 cm   Sensorineural hearing loss (SNHL) of both ears    SUI (stress urinary incontinence, female)    SVT (supraventricular tachycardia) ([place])    noted on Holter   T2DM (type 2 diabetes mellitus) ([place])    Uterovaginal prolapse    Vitamin D deficiency    Voiding dysfunction    Past Surgical History:  Procedure Laterality Date   ABDOMINAL HYSTERECTOMY     partial   BLADDER SUSPENSION     CHOLECYSTECTOMY     COLONOSCOPY N/A 07/25/2017   Procedure: COLONOSCOPY;  Surgeon: [name], MD;  Location: [facility];  Service: Endoscopy;  Laterality: N/A;   COLONOSCOPY WITH PROPOFOL N/A 10/19/2021   Procedure: COLONOSCOPY WITH PROPOFOL;  Surgeon: [name], MD;  Location: [facility] ENDOSCOPY;  Service: Endoscopy;  Laterality: N/A;  DM   ESOPHAGOGASTRODUODENOSCOPY (EGD) WITH PROPOFOL N/A 10/19/2021   Procedure: ESOPHAGOGASTRODUODENOSCOPY (EGD) WITH PROPOFOL;  Surgeon: [name], MD;  Location: [facility] ENDOSCOPY;  Service: Endoscopy;  Laterality: N/A;   JOINT REPLACEMENT Bilateral    PARTIAL KNEE REPLACEMENT   KNEE SURGERY     PARATHYROIDECTOMY     Family History  Problem Relation Age of Onset   Heart failure Mother    Stroke Father     Breast cancer Neg Hx    Social History   Tobacco Use   Smoking status: Never   Smokeless tobacco: Never  Vaping Use   Vaping Use: Never used  Substance Use Topics   Alcohol use: Not Currently    Comment: occ   Drug use: No    Pertinent Clinical Results:  LABS:  Hospital Outpatient Visit on 11/13/2021  Component Date Value Ref Range Status   WBC 11/13/2021 7.7  4.0 - 10.5 K/uL Final   RBC 11/13/2021 3.21 (L)  3.87 - 5.11 MIL/uL Final   Hemoglobin 11/13/2021 8.0 (L)  12.0 - 15.0 g/dL Final   HCT 11/13/2021 28.2 (L)  36.0 - 46.0 % Final   MCV 11/13/2021 87.9  80.0 - 100.0 fL Final   MCH 11/13/2021 24.9 (L)  26.0 - 34.0 pg Final   MCHC 11/13/2021 28.4 (L)  30.0 - 36.0 g/dL Final   RDW 11/13/2021 15.9 (H)  11.5 - 15.5 % Final   Platelets 11/13/2021 304  150 - 400 K/uL Final   nRBC 11/13/2021 0.0  0.0 - 0.2 % Final   Performed at [facility], [address] [place], [place] [address]   ABO/RH(D) 11/13/2021 O NEG   Final   Antibody Screen 11/13/2021 NEG   Final   Sample Expiration 11/13/2021 11/27/2021,2359   Final   Extend sample reason 11/13/2021    Final                   Value:NO TRANSFUSIONS OR PREGNANCY IN THE PAST 3 MONTHS Performed at [facility], [address] [place],  [address]    Prothrombin Time 11/13/2021 14.4  11.4 - 15.2 seconds Final   INR 11/13/2021 1.1  0.8 - 1.2 Final   Comment: (NOTE) INR goal varies based on device and disease states. Performed at [facility], [address] [place],  [address]     ECG: Date: 09/10/2021 Rate: 73 bpm Rhythm: normal sinus Axis (leads I and aVF): Normal Intervals: PR 168 ms. QRS 72 ms. QTc 513 ms. ST segment and T wave changes: No evidence of acute ST segment elevation or depression.  Prolonged QTc.  Evidence of an age undetermined anterolateral infarct present. Comparison: Similar to previous tracing obtained on 08/19/2021   IMAGING / PROCEDURES: DIAGNOSTIC BONE  SURVEY MET performed on 10/29/2021 No evidence of lytic or sclerotic lesions noted  CT CHEST WITHOUT CONTRAST performed on 10/29/2021 No definitely suspicious mass or lymphadenopathy identified in the chest. 4 mm subpleural nodule in the right lower lobe. Recommend comparison with any prior CT if available to ensure stability, otherwise consider follow-up in 3-6 months given the history of malignancy. Small pericardial effusion. Coronary artery disease and atherosclerotic disease. Aortic atherosclerosis   CT ABDOMEN AND PELVIS WITH CONTRAST performed on 10/27/2014  Large mass exophytic extending from lower to mid pole of the RIGHT kidney consistent with renal neoplasm. 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 Potential two moieties of the RIGHT kidney. Anatomy distorted by the mass. The mass is exophytic from the upper moiety and more greatly involved in lower moiety. The RIGHT renal vein does  not appear to be involved with tumor. The large RIGHT renal mass abuts but appears contained within the pararenal fascia. Small retroperitoneal lymph nodes are indeterminate for metastatic adenopathy. Recommend non emergent urology surgical consultation.   MYOCARDIAL PERFUSION IMAGING STUDY (LEXISCAN) performed on 09/03/2021 LVEF 63% No regional wall motion abnormalities No artifact Left ventricular cavity size normal No evidence of stress-induced myocardial ischemia or arrhythmia Study determined to be normal and low risk  TRANSTHORACIC ECHOCARDIOGRAM performed on 09/03/2021 LVEF >46% Diastolic Doppler parameters consistent with impaired relaxation (G1DD). Mild LA enlargement Trivial TR/PR Mild AR Moderate MR No evidence of valvular stenosis No pericardial effusion   Impression and Plan:  Gabrielle Savage has been referred for pre-anesthesia review and clearance prior to her undergoing the planned anesthetic and procedural courses. Available labs, pertinent testing, and  imaging results were personally reviewed by me.  Patient with progressive anemia.  Hemoglobin 8.0 with a hematocrit of 28.2 today.  Patient with a history of CKD-III, additionally she has this newly discovered RIGHT renal mass concerning for probable RCC.  Anemia likely secondary to chronic disease and/or malignancy.  Value sent to primary attending surgeon for review and consideration of a possible preoperative PRBC transfusion.  Order entered to have CBC rechecked on the morning of her procedure to ensure stability.  This patient has been appropriately cleared by cardiology with an overall LOW risk of significant perioperative cardiovascular complications. Based on clinical review performed today (11/13/21), barring any significant acute changes in the patient's overall condition, it is anticipated that she will be able to proceed with the planned surgical intervention. Any acute changes in clinical condition may necessitate her procedure being postponed and/or cancelled. Patient will meet with anesthesia team (MD and/or CRNA) on the day of her procedure for preoperative evaluation/assessment. Questions regarding anesthetic course will be fielded at that time.   Pre-surgical instructions were reviewed with the patient during her PAT appointment and questions were fielded by PAT clinical staff. Patient was advised that if any questions or concerns arise prior to her procedure then she should return a call to PAT and/or her surgeon's office to discuss.  Honor Loh, MSN, APRN, FNP-C, CEN [facility]  Peri-operative Services Nurse Practitioner Phone: [phone] Fax: [phone] 11/13/21 2:59 PM  NOTE: This note has been prepared using Dragon dictation software. Despite my best ability to proofread, there is always the potential that unintentional transcriptional errors may still occur from this process.

## 2021-11-18 ENCOUNTER — Other Ambulatory Visit: Payer: Self-pay

## 2021-11-18 ENCOUNTER — Other Ambulatory Visit
Admission: RE | Admit: 2021-11-18 | Discharge: 2021-11-18 | Disposition: A | Discharge status: Home / Self Care | Payer: Medicare Other | Source: Ambulatory Visit | Attending: Urology | Admitting: Urology

## 2021-11-18 DIAGNOSIS — Z1152 Encounter for screening for COVID-19: Secondary | ICD-10-CM

## 2021-11-18 DIAGNOSIS — Z20822 Contact with and (suspected) exposure to covid-19: Secondary | ICD-10-CM | POA: Insufficient documentation

## 2021-11-18 DIAGNOSIS — Z01812 Encounter for preprocedural laboratory examination: Secondary | ICD-10-CM | POA: Insufficient documentation

## 2021-11-18 LAB — SARS CORONAVIRUS 2 (TAT 6-24 HRS): SARS Coronavirus 2: NEGATIVE

## 2021-11-20 ENCOUNTER — Inpatient Hospital Stay: Payer: Medicare Other | Admitting: Urgent Care

## 2021-11-20 ENCOUNTER — Other Ambulatory Visit: Payer: Self-pay

## 2021-11-20 ENCOUNTER — Inpatient Hospital Stay
Admission: RE | Admit: 2021-11-20 | Discharge: 2021-11-21 | DRG: 658 | Disposition: A | Discharge status: Home / Self Care | Payer: Medicare Other | Attending: Urology | Admitting: Urology

## 2021-11-20 ENCOUNTER — Encounter: Payer: Self-pay | Admitting: Urology

## 2021-11-20 ENCOUNTER — Encounter
Admission: RE | Disposition: A | Discharge status: Home / Self Care | Payer: Self-pay | Source: Home / Self Care | Attending: Urology

## 2021-11-20 DIAGNOSIS — E785 Hyperlipidemia, unspecified: Secondary | ICD-10-CM | POA: Diagnosis present

## 2021-11-20 DIAGNOSIS — Z823 Family history of stroke: Secondary | ICD-10-CM

## 2021-11-20 DIAGNOSIS — N2889 Other specified disorders of kidney and ureter: Secondary | ICD-10-CM

## 2021-11-20 DIAGNOSIS — N1832 Chronic kidney disease, stage 3b: Secondary | ICD-10-CM | POA: Diagnosis present

## 2021-11-20 DIAGNOSIS — I2510 Atherosclerotic heart disease of native coronary artery without angina pectoris: Secondary | ICD-10-CM | POA: Diagnosis present

## 2021-11-20 DIAGNOSIS — Z8249 Family history of ischemic heart disease and other diseases of the circulatory system: Secondary | ICD-10-CM

## 2021-11-20 DIAGNOSIS — C641 Malignant neoplasm of right kidney, except renal pelvis: Secondary | ICD-10-CM | POA: Diagnosis not present

## 2021-11-20 DIAGNOSIS — H9193 Unspecified hearing loss, bilateral: Secondary | ICD-10-CM | POA: Diagnosis present

## 2021-11-20 DIAGNOSIS — N3281 Overactive bladder: Secondary | ICD-10-CM | POA: Diagnosis present

## 2021-11-20 DIAGNOSIS — E1122 Type 2 diabetes mellitus with diabetic chronic kidney disease: Secondary | ICD-10-CM | POA: Diagnosis present

## 2021-11-20 DIAGNOSIS — Z20822 Contact with and (suspected) exposure to covid-19: Secondary | ICD-10-CM | POA: Diagnosis present

## 2021-11-20 DIAGNOSIS — I129 Hypertensive chronic kidney disease with stage 1 through stage 4 chronic kidney disease, or unspecified chronic kidney disease: Secondary | ICD-10-CM | POA: Diagnosis present

## 2021-11-20 DIAGNOSIS — E559 Vitamin D deficiency, unspecified: Secondary | ICD-10-CM | POA: Diagnosis present

## 2021-11-20 DIAGNOSIS — C649 Malignant neoplasm of unspecified kidney, except renal pelvis: Secondary | ICD-10-CM | POA: Diagnosis present

## 2021-11-20 DIAGNOSIS — D6489 Other specified anemias: Secondary | ICD-10-CM | POA: Diagnosis present

## 2021-11-20 DIAGNOSIS — Z90710 Acquired absence of both cervix and uterus: Secondary | ICD-10-CM

## 2021-11-20 DIAGNOSIS — Z9049 Acquired absence of other specified parts of digestive tract: Secondary | ICD-10-CM

## 2021-11-20 HISTORY — DX: Hyperlipidemia, unspecified: E78.5

## 2021-11-20 HISTORY — DX: Chronic kidney disease, stage 3 unspecified: N18.30

## 2021-11-20 HISTORY — DX: Type 2 diabetes mellitus without complications: E11.9

## 2021-11-20 HISTORY — DX: Sensorineural hearing loss, bilateral: H90.3

## 2021-11-20 HISTORY — PX: LAPAROSCOPIC NEPHRECTOMY, HAND ASSISTED: SHX1929

## 2021-11-20 HISTORY — DX: Major depressive disorder, single episode, unspecified: F32.9

## 2021-11-20 HISTORY — DX: Other forms of dyspnea: R06.09

## 2021-11-20 HISTORY — DX: Other specified postprocedural states: Z98.890

## 2021-11-20 HISTORY — DX: Iron deficiency anemia, unspecified: D50.9

## 2021-11-20 HISTORY — DX: Deficiency of other specified B group vitamins: E53.8

## 2021-11-20 HISTORY — DX: Other specified postprocedural states: R11.2

## 2021-11-20 LAB — CBC
HCT: 28.9 % — ABNORMAL LOW (ref 36.0–46.0)
Hemoglobin: 8.3 g/dL — ABNORMAL LOW (ref 12.0–15.0)
MCH: 24.8 pg — ABNORMAL LOW (ref 26.0–34.0)
MCHC: 28.7 g/dL — ABNORMAL LOW (ref 30.0–36.0)
MCV: 86.3 fL (ref 80.0–100.0)
Platelets: 328 10*3/uL (ref 150–400)
RBC: 3.35 MIL/uL — ABNORMAL LOW (ref 3.87–5.11)
RDW: 15.9 % — ABNORMAL HIGH (ref 11.5–15.5)
WBC: 6.8 10*3/uL (ref 4.0–10.5)
nRBC: 0 % (ref 0.0–0.2)

## 2021-11-20 LAB — GLUCOSE, CAPILLARY
Glucose-Capillary: 149 mg/dL — ABNORMAL HIGH (ref 70–99)
Glucose-Capillary: 211 mg/dL — ABNORMAL HIGH (ref 70–99)
Glucose-Capillary: 209 mg/dL — ABNORMAL HIGH (ref 70–99)
Glucose-Capillary: 202 mg/dL — ABNORMAL HIGH (ref 70–99)
Glucose-Capillary: 141 mg/dL — ABNORMAL HIGH (ref 70–99)

## 2021-11-20 LAB — ABO/RH: ABO/RH(D): O NEG

## 2021-11-20 SURGERY — NEPHRECTOMY, HAND-ASSISTED, LAPAROSCOPIC
Anesthesia: General | Laterality: Right

## 2021-11-20 MED ORDER — ORAL CARE MOUTH RINSE: 15.0000 mL | Freq: Once | OROMUCOSAL | Status: AC

## 2021-11-20 MED ORDER — ROCURONIUM BROMIDE 100 MG/10ML IV SOLN
INTRAVENOUS | Status: DC | PRN
  Administered 2021-11-20: 50 mg via INTRAVENOUS
  Administered 2021-11-20: 30 mg via INTRAVENOUS
  Administered 2021-11-20: 20 mg via INTRAVENOUS

## 2021-11-20 MED ORDER — KETAMINE HCL 50 MG/5ML IJ SOSY
PREFILLED_SYRINGE | INTRAMUSCULAR | Status: AC
  Filled 2021-11-20: qty 5

## 2021-11-20 MED ORDER — ACETAMINOPHEN 10 MG/ML IV SOLN
INTRAVENOUS | Status: AC
  Filled 2021-11-20: qty 100

## 2021-11-20 MED ORDER — PHENYLEPHRINE HCL (PRESSORS) 10 MG/ML IV SOLN
INTRAVENOUS | Status: DC | PRN
  Administered 2021-11-20: 80 ug via INTRAVENOUS
  Administered 2021-11-20: 160 ug via INTRAVENOUS
  Administered 2021-11-20 (×2): 80 ug via INTRAVENOUS

## 2021-11-20 MED ORDER — PROPOFOL 10 MG/ML IV BOLUS
INTRAVENOUS | Status: AC
  Filled 2021-11-20: qty 20

## 2021-11-20 MED ORDER — HEPARIN SODIUM (PORCINE) 5000 UNIT/ML IJ SOLN
5000.0000 [IU] | Freq: Three times a day (TID) | INTRAMUSCULAR | Status: DC
  Administered 2021-11-20 – 2021-11-21 (×3): 5000 [IU] via SUBCUTANEOUS
  Filled 2021-11-20 (×3): qty 1

## 2021-11-20 MED ORDER — PROPOFOL 10 MG/ML IV BOLUS
INTRAVENOUS | Status: DC | PRN
  Administered 2021-11-20: 120 mg via INTRAVENOUS
  Administered 2021-11-20: 140 ug/kg/min via INTRAVENOUS

## 2021-11-20 MED ORDER — FAMOTIDINE 20 MG PO TABS
ORAL_TABLET | ORAL | Status: AC
Start: 2021-11-20 — End: 2021-11-20
  Administered 2021-11-20: 20 mg via ORAL
  Filled 2021-11-20: qty 1

## 2021-11-20 MED ORDER — HEMOSTATIC AGENTS (NO CHARGE) OPTIME
TOPICAL | Status: DC | PRN
  Administered 2021-11-20: 1 via TOPICAL

## 2021-11-20 MED ORDER — HYDROCODONE-ACETAMINOPHEN 5-325 MG PO TABS
1.0000 | ORAL_TABLET | ORAL | Status: DC | PRN
  Administered 2021-11-20: 2 via ORAL
  Administered 2021-11-20: 1 via ORAL
  Administered 2021-11-21: 2 via ORAL
  Filled 2021-11-20 (×2): qty 2
  Filled 2021-11-20: qty 1

## 2021-11-20 MED ORDER — ONDANSETRON HCL 4 MG/2ML IJ SOLN: 4.0000 mg | INTRAMUSCULAR | Status: DC | PRN

## 2021-11-20 MED ORDER — CITALOPRAM HYDROBROMIDE 20 MG PO TABS
10.0000 mg | ORAL_TABLET | ORAL | Status: DC
  Administered 2021-11-21: 10 mg via ORAL
  Filled 2021-11-20: qty 1

## 2021-11-20 MED ORDER — CEFAZOLIN SODIUM-DEXTROSE 2-4 GM/100ML-% IV SOLN
INTRAVENOUS | Status: AC
  Filled 2021-11-20: qty 100

## 2021-11-20 MED ORDER — STERILE WATER FOR IRRIGATION IR SOLN
Status: DC | PRN
  Administered 2021-11-20: 100 mL

## 2021-11-20 MED ORDER — SODIUM CHLORIDE FLUSH 0.9 % IV SOLN
INTRAVENOUS | Status: AC
  Filled 2021-11-20: qty 10

## 2021-11-20 MED ORDER — HYDROMORPHONE HCL 1 MG/ML IJ SOLN
0.5000 mg | INTRAMUSCULAR | Status: DC | PRN
  Administered 2021-11-20: 1 mg via INTRAVENOUS
  Filled 2021-11-20: qty 1

## 2021-11-20 MED ORDER — BUPIVACAINE LIPOSOME 1.3 % IJ SUSP
INTRAMUSCULAR | Status: DC | PRN
  Administered 2021-11-20: 50 mL

## 2021-11-20 MED ORDER — HEPARIN SODIUM (PORCINE) 5000 UNIT/ML IJ SOLN: 5000.0000 [IU] | Freq: Once | INTRAMUSCULAR | Status: AC

## 2021-11-20 MED ORDER — FENTANYL CITRATE (PF) 100 MCG/2ML IJ SOLN
INTRAMUSCULAR | Status: AC
  Filled 2021-11-20: qty 2

## 2021-11-20 MED ORDER — SUGAMMADEX SODIUM 500 MG/5ML IV SOLN
INTRAVENOUS | Status: AC
  Filled 2021-11-20: qty 5

## 2021-11-20 MED ORDER — EPHEDRINE SULFATE (PRESSORS) 50 MG/ML IJ SOLN
INTRAMUSCULAR | Status: DC | PRN
  Administered 2021-11-20: 10 mg via INTRAVENOUS

## 2021-11-20 MED ORDER — PROPOFOL 500 MG/50ML IV EMUL
INTRAVENOUS | Status: AC
  Filled 2021-11-20: qty 50

## 2021-11-20 MED ORDER — ROCURONIUM BROMIDE 10 MG/ML (PF) SYRINGE
PREFILLED_SYRINGE | INTRAVENOUS | Status: AC
  Filled 2021-11-20: qty 10

## 2021-11-20 MED ORDER — SUGAMMADEX SODIUM 200 MG/2ML IV SOLN
INTRAVENOUS | Status: DC | PRN
  Administered 2021-11-20: 340 mg via INTRAVENOUS

## 2021-11-20 MED ORDER — PHENYLEPHRINE HCL-NACL 20-0.9 MG/250ML-% IV SOLN
INTRAVENOUS | Status: AC
  Filled 2021-11-20: qty 250

## 2021-11-20 MED ORDER — EPHEDRINE 5 MG/ML INJ
INTRAVENOUS | Status: AC
  Filled 2021-11-20: qty 5

## 2021-11-20 MED ORDER — SURGIFLO WITH THROMBIN (HEMOSTATIC MATRIX KIT) OPTIME
TOPICAL | Status: DC | PRN
Start: 2021-11-20 — End: 2021-11-20
  Administered 2021-11-20: 1 via TOPICAL

## 2021-11-20 MED ORDER — DEXAMETHASONE SODIUM PHOSPHATE 10 MG/ML IJ SOLN
INTRAMUSCULAR | Status: AC
  Filled 2021-11-20: qty 1

## 2021-11-20 MED ORDER — CHLORHEXIDINE GLUCONATE 0.12 % MT SOLN
OROMUCOSAL | Status: AC
  Administered 2021-11-20: 15 mL via OROMUCOSAL
  Filled 2021-11-20: qty 15

## 2021-11-20 MED ORDER — APREPITANT 40 MG PO CAPS
ORAL_CAPSULE | ORAL | Status: AC
  Administered 2021-11-20: 40 mg via ORAL
  Filled 2021-11-20: qty 1

## 2021-11-20 MED ORDER — ONDANSETRON HCL 4 MG/2ML IJ SOLN: 4.0000 mg | Freq: Once | INTRAMUSCULAR | Status: DC | PRN

## 2021-11-20 MED ORDER — ONDANSETRON HCL 4 MG/2ML IJ SOLN
INTRAMUSCULAR | Status: AC
  Filled 2021-11-20: qty 2

## 2021-11-20 MED ORDER — INSULIN ASPART 100 UNIT/ML IJ SOLN: 0.0000 [IU] | Freq: Every day | INTRAMUSCULAR | Status: DC

## 2021-11-20 MED ORDER — ACETAMINOPHEN 325 MG PO TABS: 650.0000 mg | ORAL_TABLET | ORAL | Status: DC | PRN

## 2021-11-20 MED ORDER — ACETAMINOPHEN 10 MG/ML IV SOLN
INTRAVENOUS | Status: DC | PRN
  Administered 2021-11-20: 1000 mg via INTRAVENOUS

## 2021-11-20 MED ORDER — LIDOCAINE HCL (PF) 2 % IJ SOLN
INTRAMUSCULAR | Status: AC
  Filled 2021-11-20: qty 5

## 2021-11-20 MED ORDER — PROPOFOL 1000 MG/100ML IV EMUL
INTRAVENOUS | Status: AC
  Filled 2021-11-20: qty 200

## 2021-11-20 MED ORDER — ONDANSETRON HCL 4 MG/2ML IJ SOLN
INTRAMUSCULAR | Status: DC | PRN
  Administered 2021-11-20: 4 mg via INTRAVENOUS

## 2021-11-20 MED ORDER — BUPIVACAINE HCL (PF) 0.5 % IJ SOLN
INTRAMUSCULAR | Status: AC
  Filled 2021-11-20: qty 30

## 2021-11-20 MED ORDER — CHLORHEXIDINE GLUCONATE 0.12 % MT SOLN: 15.0000 mL | Freq: Once | OROMUCOSAL | Status: AC

## 2021-11-20 MED ORDER — FENTANYL CITRATE (PF) 100 MCG/2ML IJ SOLN
INTRAMUSCULAR | Status: DC | PRN
  Administered 2021-11-20 (×2): 50 ug via INTRAVENOUS

## 2021-11-20 MED ORDER — BUPIVACAINE LIPOSOME 1.3 % IJ SUSP
INTRAMUSCULAR | Status: AC
  Filled 2021-11-20: qty 20

## 2021-11-20 MED ORDER — SODIUM CHLORIDE 0.45 % IV SOLN: INTRAVENOUS | Status: DC

## 2021-11-20 MED ORDER — MIDAZOLAM HCL 2 MG/2ML IJ SOLN
INTRAMUSCULAR | Status: DC | PRN
  Administered 2021-11-20: 2 mg via INTRAVENOUS

## 2021-11-20 MED ORDER — GLYCOPYRROLATE 0.2 MG/ML IJ SOLN
INTRAMUSCULAR | Status: AC
  Filled 2021-11-20: qty 1

## 2021-11-20 MED ORDER — DOCUSATE SODIUM 100 MG PO CAPS
100.0000 mg | ORAL_CAPSULE | Freq: Two times a day (BID) | ORAL | Status: DC
  Administered 2021-11-20 – 2021-11-21 (×2): 100 mg via ORAL
  Filled 2021-11-20 (×2): qty 1

## 2021-11-20 MED ORDER — GLYCOPYRROLATE 0.2 MG/ML IJ SOLN
INTRAMUSCULAR | Status: DC | PRN
  Administered 2021-11-20: .1 mg via INTRAVENOUS

## 2021-11-20 MED ORDER — FAMOTIDINE 20 MG PO TABS: 20.0000 mg | ORAL_TABLET | Freq: Once | ORAL | Status: AC

## 2021-11-20 MED ORDER — CEFAZOLIN SODIUM-DEXTROSE 2-4 GM/100ML-% IV SOLN
2.0000 g | INTRAVENOUS | Status: AC
  Administered 2021-11-20: 2 g via INTRAVENOUS

## 2021-11-20 MED ORDER — KETAMINE HCL 10 MG/ML IJ SOLN
INTRAMUSCULAR | Status: DC | PRN
  Administered 2021-11-20 (×2): 10 mg via INTRAVENOUS
  Administered 2021-11-20: 20 mg via INTRAVENOUS
  Administered 2021-11-20: 10 mg via INTRAVENOUS

## 2021-11-20 MED ORDER — SODIUM CHLORIDE 0.9 % IV SOLN: INTRAVENOUS | Status: DC

## 2021-11-20 MED ORDER — APREPITANT 40 MG PO CAPS
40.0000 mg | ORAL_CAPSULE | Freq: Once | ORAL | Status: AC
Start: 2021-11-20 — End: 2021-11-20

## 2021-11-20 MED ORDER — INSULIN ASPART 100 UNIT/ML IJ SOLN
0.0000 [IU] | Freq: Three times a day (TID) | INTRAMUSCULAR | Status: DC
  Administered 2021-11-20: 3 [IU] via SUBCUTANEOUS
  Administered 2021-11-21: 1 [IU] via SUBCUTANEOUS
  Filled 2021-11-20 (×2): qty 1

## 2021-11-20 MED ORDER — FENTANYL CITRATE (PF) 100 MCG/2ML IJ SOLN
25.0000 ug | INTRAMUSCULAR | Status: DC | PRN
  Administered 2021-11-20: 50 ug via INTRAVENOUS
  Administered 2021-11-20: 25 ug via INTRAVENOUS

## 2021-11-20 MED ORDER — MIDAZOLAM HCL 2 MG/2ML IJ SOLN
INTRAMUSCULAR | Status: AC
  Filled 2021-11-20: qty 2

## 2021-11-20 MED ORDER — HEPARIN SODIUM (PORCINE) 5000 UNIT/ML IJ SOLN
INTRAMUSCULAR | Status: AC
  Administered 2021-11-20: 5000 [IU] via SUBCUTANEOUS
  Filled 2021-11-20: qty 1

## 2021-11-20 MED ORDER — LIDOCAINE HCL (CARDIAC) PF 100 MG/5ML IV SOSY
PREFILLED_SYRINGE | INTRAVENOUS | Status: DC | PRN
  Administered 2021-11-20: 100 mg via INTRAVENOUS

## 2021-11-20 MED ORDER — DEXAMETHASONE SODIUM PHOSPHATE 10 MG/ML IJ SOLN
INTRAMUSCULAR | Status: DC | PRN
  Administered 2021-11-20: 5 mg via INTRAVENOUS

## 2021-11-20 SURGICAL SUPPLY — 85 items
ANCHOR TIS RET SYS 1550ML (BAG) ×1 IMPLANT
APPLICATOR SURGIFLO ENDO (HEMOSTASIS) IMPLANT
APPLIER CLIP ROT 10 11.4 M/L (STAPLE)
APPLIER CLIP ROT 13.4 12 LRG (CLIP)
BAG LAPAROSCOPIC 12 15 PORT 16 (BASKET) IMPLANT
BAG RETRIEVAL 12/15 (BASKET)
CHLORAPREP W/TINT 26 (MISCELLANEOUS) ×2 IMPLANT
CLEANER CAUTERY TIP 5X5 PAD (MISCELLANEOUS) ×1 IMPLANT
CLIP APPLIE ROT 10 11.4 M/L (STAPLE) IMPLANT
CLIP APPLIE ROT 13.4 12 LRG (CLIP) IMPLANT
CLIP LIGATING HEM O LOK PURPLE (MISCELLANEOUS) ×4 IMPLANT
CUTTER ECHEON FLEX ENDO 45 340 (ENDOMECHANICALS) IMPLANT
DEFOGGER SCOPE WARMER CLEARIFY (MISCELLANEOUS) ×2 IMPLANT
DERMABOND ADVANCED (GAUZE/BANDAGES/DRESSINGS) ×1
DERMABOND ADVANCED .7 DNX12 (GAUZE/BANDAGES/DRESSINGS) ×1 IMPLANT
DRAPE INCISE IOBAN 66X45 STRL (DRAPES) ×2 IMPLANT
DRAPE SURG 17X11 SM STRL (DRAPES) ×8 IMPLANT
DRSG TELFA 3X8 NADH (GAUZE/BANDAGES/DRESSINGS) IMPLANT
ELECT REM PT RETURN 9FT ADLT (ELECTROSURGICAL) ×2
ELECTRODE REM PT RTRN 9FT ADLT (ELECTROSURGICAL) ×1 IMPLANT
GLOVE SURG ENC MOIS LTX SZ6.5 (GLOVE) ×2 IMPLANT
GLOVE SURG UNDER POLY LF SZ7.5 (GLOVE) ×4 IMPLANT
GOWN STRL REUS W/ TWL LRG LVL3 (GOWN DISPOSABLE) ×2 IMPLANT
GOWN STRL REUS W/ TWL XL LVL3 (GOWN DISPOSABLE) ×1 IMPLANT
GOWN STRL REUS W/TWL LRG LVL3 (GOWN DISPOSABLE) ×2
GOWN STRL REUS W/TWL XL LVL3 (GOWN DISPOSABLE) ×1
GRASPER SUT TROCAR 14GX15 (MISCELLANEOUS) ×2 IMPLANT
HANDLE YANKAUER SUCT BULB TIP (MISCELLANEOUS) IMPLANT
HEMOSTAT SURGICEL 2X14 (HEMOSTASIS) IMPLANT
HOLDER FOLEY CATH W/STRAP (MISCELLANEOUS) ×2 IMPLANT
IRRIGATION STRYKERFLOW (MISCELLANEOUS) ×1 IMPLANT
IRRIGATOR STRYKERFLOW (MISCELLANEOUS) ×2
KIT PINK PAD W/HEAD ARE REST (MISCELLANEOUS) ×2
KIT PINK PAD W/HEAD ARM REST (MISCELLANEOUS) ×1 IMPLANT
KIT TURNOVER KIT A (KITS) ×2 IMPLANT
KITTNER LAPARASCOPIC 5X40 (MISCELLANEOUS) IMPLANT
L-HOOK LAP DISP 36CM (ELECTROSURGICAL) ×2
LABEL OR SOLS (LABEL) ×2 IMPLANT
LHOOK LAP DISP 36CM (ELECTROSURGICAL) ×1 IMPLANT
LIGASURE LAP ATLAS 10MM 37CM (INSTRUMENTS) IMPLANT
LOOP RED MAXI  1X406MM (MISCELLANEOUS)
LOOP VESSEL MAXI 1X406 RED (MISCELLANEOUS) IMPLANT
MANIFOLD NEPTUNE II (INSTRUMENTS) ×2 IMPLANT
NDL HYPO 21X1.5 SAFETY (NEEDLE) ×1 IMPLANT
NEEDLE HYPO 21X1.5 SAFETY (NEEDLE) ×2 IMPLANT
NEEDLE HYPO 22GX1.5 SAFETY (NEEDLE) ×2 IMPLANT
PACK LAP CHOLECYSTECTOMY (MISCELLANEOUS) ×2 IMPLANT
PAD CLEANER CAUTERY TIP 5X5 (MISCELLANEOUS) ×1
PAD DRESSING TELFA 3X8 NADH (GAUZE/BANDAGES/DRESSINGS) IMPLANT
PENCIL ELECTRO HAND CTR (MISCELLANEOUS) ×2 IMPLANT
RELOAD STAPLE 35X2.5 WHT THIN (STAPLE) IMPLANT
RELOAD STAPLE 45 2.6 WHT THIN (STAPLE) IMPLANT
RELOAD STAPLE 60 2.6 WHT THN (STAPLE) IMPLANT
RELOAD STAPLER WHITE 60MM (STAPLE) ×4 IMPLANT
SCISSORS METZENBAUM CVD 33 (INSTRUMENTS) ×2 IMPLANT
SET TUBE SMOKE EVAC HIGH FLOW (TUBING) ×2 IMPLANT
SPONGE KITTNER 5P (MISCELLANEOUS) IMPLANT
SPONGE T-LAP 18X18 ~~LOC~~+RFID (SPONGE) ×2 IMPLANT
STAPLE RELOAD 2.5MM WHITE (STAPLE) IMPLANT
STAPLE RELOAD 45 WHT (STAPLE) IMPLANT
STAPLE RELOAD 45MM WHITE (STAPLE)
STAPLER ECHELON LONG 60 440 (INSTRUMENTS) ×1 IMPLANT
STAPLER RELOAD WHITE 60MM (STAPLE) ×8
STAPLER SKIN PROX 35W (STAPLE) ×2 IMPLANT
STAPLER VASCULAR ECHELON 35 (CUTTER) IMPLANT
SURGIFLO W/THROMBIN 8M KIT (HEMOSTASIS) ×1 IMPLANT
SURGILUBE 2OZ TUBE FLIPTOP (MISCELLANEOUS) ×2 IMPLANT
SUT MNCRL AB 4-0 PS2 18 (SUTURE) ×4 IMPLANT
SUT PDS AB 1 TP1 96 (SUTURE) ×2 IMPLANT
SUT VIC AB 0 CT1 36 (SUTURE) IMPLANT
SUT VIC AB 1 CT1 36 (SUTURE) ×4 IMPLANT
SUT VIC AB 2-0 SH 27 (SUTURE)
SUT VIC AB 2-0 SH 27XBRD (SUTURE) IMPLANT
SUT VIC AB 2-0 UR6 27 (SUTURE) IMPLANT
SUT VIC AB 4-0 FS2 27 (SUTURE) ×2 IMPLANT
SUT VICRYL 0 AB UR-6 (SUTURE) ×4 IMPLANT
SYR 20ML LL LF (SYRINGE) ×2 IMPLANT
SYS LAPSCP GELPORT 120MM (MISCELLANEOUS) ×2
SYSTEM LAPSCP GELPORT 120MM (MISCELLANEOUS) ×1 IMPLANT
TRAY FOLEY MTR SLVR 16FR STAT (SET/KITS/TRAYS/PACK) ×2 IMPLANT
TROCAR ENDOPATH XCEL 12X100 BL (ENDOMECHANICALS) ×4 IMPLANT
TROCAR XCEL 12X100 BLDLESS (ENDOMECHANICALS) ×2 IMPLANT
WATER STERILE IRR 1000ML POUR (IV SOLUTION) ×2 IMPLANT
WATER STERILE IRR 3000ML UROMA (IV SOLUTION) ×2 IMPLANT
WATER STERILE IRR 500ML POUR (IV SOLUTION) ×2 IMPLANT

## 2021-11-20 NOTE — Anesthesia Procedure Notes (Signed)
Procedure Name: Intubation ?Date/Time: 11/20/2021 7:44 AM ?Performed by: Cammie Sickle, CRNA ?Pre-anesthesia Checklist: Patient identified, Patient being monitored, Timeout performed, Emergency Drugs available and Suction available ?Patient Re-evaluated:Patient Re-evaluated prior to induction ?Oxygen Delivery Method: Circle system utilized ?Preoxygenation: Pre-oxygenation with 100% oxygen ?Induction Type: IV induction ?Ventilation: Mask ventilation without difficulty ?Laryngoscope Size: 3 and McGraph ?Grade View: Grade I ?Tube type: Oral ?Tube size: 7.0 mm ?Number of attempts: 1 ?Airway Equipment and Method: Stylet ?Placement Confirmation: ETT inserted through vocal cords under direct vision, positive ETCO2 and breath sounds checked- equal and bilateral ?Secured at: 21 cm ?Tube secured with: Tape ?Dental Injury: Teeth and Oropharynx as per pre-operative assessment  ? ? ? ? ?

## 2021-11-20 NOTE — Anesthesia Preprocedure Evaluation (Signed)
Anesthesia Evaluation  ?Patient identified by MRN, date of birth, ID band ?Patient awake ? ? ? ?Reviewed: ?Allergy & Precautions, NPO status , Patient's Chart, lab work & pertinent test results ? ?History of Anesthesia Complications ?(+) PONV and history of anesthetic complications ? ?Airway ?Mallampati: II ? ?TM Distance: >3 FB ?Neck ROM: Full ? ? ? Dental ? ?(+) Dental Advidsory Given, Chipped ?  ?Pulmonary ?neg pulmonary ROS, neg shortness of breath, neg sleep apnea, neg COPD, neg recent URI,  ?  ?breath sounds clear to auscultation- rhonchi ?(-) wheezing ? ? ? ? ? Cardiovascular ?Exercise Tolerance: Good ?hypertension, Pt. on medications ?(-) angina(-) CAD, (-) Past MI and (-) Cardiac Stents  ?Rhythm:Regular Rate:Normal ?- Systolic murmurs and - Diastolic murmurs ? ?  ?Neuro/Psych ?PSYCHIATRIC DISORDERS Depression negative neurological ROS ?   ? GI/Hepatic ?negative GI ROS, Neg liver ROS,   ?Endo/Other  ?negative endocrine ROSdiabetes ? Renal/GU ?Renal InsufficiencyRenal disease  ? ?  ?Musculoskeletal ? ?(+) Arthritis ,  ? Abdominal ?(+) + obese,   ?Peds ? Hematology ? ?(+) Blood dyscrasia, anemia ,   ?Anesthesia Other Findings ?Past Medical History: ?No date: Arthritis ?No date: Fatigue ?No date: Hearing loss of both ears ?No date: Hypertension ?No date: Iliotibial band syndrome, left leg ?No date: OAB (overactive bladder) ?No date: Obesity ?No date: Renal disorder ?    Comment:  STAGE 3 ?No date: SUI (stress urinary incontinence, female) ?No date: Uterovaginal prolapse ?No date: Vitamin D deficiency ?No date: Voiding dysfunction ? ? Reproductive/Obstetrics ? ?  ? ? ? ? ? ? ? ? ? ? ? ? ? ?  ?  ? ? ? ? ? ? ? ? ?Anesthesia Physical ? ?Anesthesia Plan ? ?ASA: 3 ? ?Anesthesia Plan: General  ? ?Post-op Pain Management:   ? ?Induction: Intravenous ? ?PONV Risk Score and Plan: 2 and TIVA, Propofol infusion, Treatment may vary due to age or medical condition and Ondansetron ? ?Airway  Management Planned: Oral ETT ? ?Additional Equipment:  ? ?Intra-op Plan:  ? ?Post-operative Plan:  ? ?Informed Consent: I have reviewed the patients History and Physical, chart, labs and discussed the procedure including the risks, benefits and alternatives for the proposed anesthesia with the patient or authorized representative who has indicated his/her understanding and acceptance.  ? ? ? ?Dental advisory given ? ?Plan Discussed with: CRNA and Anesthesiologist ? ?Anesthesia Plan Comments: (Patient has propofol listed as an allergy because a kidney doctor in the 1990s told her she couldn't have propofol because of her kidney disease. I discussed with her that propofol is safe to use in renal failure, she denies ever having an allergic reaction to propofol, we will proceed cautiously with using propofol for the procedure. )  ? ? ? ? ? ? ?Anesthesia Quick Evaluation ? ?

## 2021-11-20 NOTE — Plan of Care (Signed)
?  Problem: Urinary Elimination: ?Goal: Ability to avoid or minimize complications of infection will improve ?Outcome: Progressing ?  ?

## 2021-11-20 NOTE — Op Note (Signed)
Date of procedure: 11/20/21 ? ?Preoperative diagnosis:  ?Right renal mass ? ?Postoperative diagnosis:  ?Same ? ?Procedure: ?Right laparoscopic hand-assisted radical nephrectomy ? ?Surgeon: Nickolas Madrid, MD ? ?Assistant: Hollice Espy, MD ? ?(An assistant was required for this surgical procedure.  The duties of the assistant included but were not limited to suctioning, passing suture, camera manipulation, retraction. This procedure would not be able to be performed without an assistant) ?  ?Anesthesia: General ? ?Complications: None ? ?Intraoperative findings:  ?Uncomplicated right radical nephrectomy ? ?EBL: 50 mL ? ?Specimens: Right kidney and proximal ureter ? ?Drains: 16 French Foley ? ?Indication: Gabrielle Savage is a 76 y.o. patient with 8 cm enhancing renal mass consistent with RCC who opted for laparoscopic radical nephrectomy for treatment.  After reviewing the management options for treatment, they elected to proceed with the above surgical procedure(s). We have discussed the potential benefits and risks of the procedure, side effects of the proposed treatment, the likelihood of the patient achieving the goals of the procedure, and any potential problems that might occur during the procedure or recuperation. Informed consent has been obtained. ? ?Description of procedure: ? ?The patient was taken to the operating room and general anesthesia was induced. SCDs were placed and 5000 units of subcutaneous heparin given for DVT prophylaxis. The patient was placed in the modified flank position with the right side up and all extremities carefully padded and secured, prepped and draped in the usual sterile fashion, and preoperative antibiotics(Ancef) were administered. A preoperative time-out was performed.  A 16 French Foley catheter was placed. ? ?A 7 cm incision was made in the right lower quadrant and dissected down to fascia.  The fascia was opened and the rectus was retracted medially.  The peritoneum was  carefully opened, and there was no evidence of bowel injury.  The gel hand port was placed and the abdomen inflated.  Under direct vision there appeared to be some thin adhesions in the right upper quadrant from her prior open cholecystectomy.  Under direct vision an additional 11 mm port was placed just above the umbilicus, and an additional 11 mm port placed 7 cm caudally.  These ports were preclosed with the Carter-Thomason device. ? ?The LigaSure was used to carefully take down the thin adhesions over the right upper quadrant until the colon and kidney mass could be visualized.  The colon was taken down and reflected medially, and there was an excellent plane.  At this point I was able to identify the ureter medially and this was held up for retraction.  The tail of Gerota's fascia was divided using a vascular staple load with the numerous parasitic vessels.  Using a combination of the LigaSure and hook cautery I carefully worked my way superiorly toward the hilum.  There was a crossing vessel and packet just below the hilum, and this was also taken with a vascular staple load.  At this point I worked laterally to free up the kidney working up towards the liver.  The adrenal was spared superiorly, and I was able to identify the main hilar complex.  This was carefully isolated, and held up and away from the IVC and the hilum was taken with a vascular load of the stapler.  There was excellent hemostasis.  There was a small amount of residual tissue laterally, and this was secured with a Weck clip, and divided.  At this point I was able to work laterally with the LigaSure to free up the rest of  the kidney, and it was pulled down away from the liver until completely free.  Weck clips were placed on the ureter, and the ureter divided.  The specimen was placed into a large bag, and removed. ? ?The right upper quadrant was carefully irrigated with water and there was no significant bleeding noted.  Surgicel and  Surgi-Flo were placed over the hilum and in the right upper quadrant. ? ?Liposomal Marcaine was injected into the fascia of the right lower quadrant incision, as well as the skin and port sites.  The preplaced sutures of the port sites were tied down. ? ?The anterior fascia of the right lower quadrant incision was closed with a running 0 PDS looped suture.  The wound was irrigated with water.  Scarpa's fascia was reapproximated with interrupted Vicryl sutures.  The skin of the right lower quadrant incision and port sites was closed with running Monocryl, and Dermabond applied. ? ? ?Disposition: Stable to PACU ? ?Plan: ?Advance diet as tolerated tomorrow ?We will follow-up with oncology to consider adjuvant treatment  ? ?Nickolas Madrid, MD ? ? ? ? ?

## 2021-11-20 NOTE — Transfer of Care (Signed)
Immediate Anesthesia Transfer of Care Note ? ?Patient: Gabrielle Savage ? ?Procedure(s) Performed: HAND ASSISTED LAPAROSCOPIC RADICAL NEPHRECTOMY (Right) ? ?Patient Location: PACU ? ?Anesthesia Type:General ? ?Level of Consciousness: drowsy ? ?Airway & Oxygen Therapy: Patient Spontanous Breathing and Patient connected to face mask oxygen ? ?Post-op Assessment: Report given to RN and Post -op Vital signs reviewed and stable ? ?Post vital signs: Reviewed and stable ? ?Last Vitals:  ?Vitals Value Taken Time  ?BP 132/51 11/20/21 1015  ?Temp 36.1   ?Pulse 66 11/20/21 1019  ?Resp 20 11/20/21 1019  ?SpO2 99 % 11/20/21 1019  ?Vitals shown include unvalidated device data. ? ?Last Pain:  ?Vitals:  ? 11/20/21 0616  ?TempSrc: Oral  ?PainSc: 0-No pain  ?   ? ?Patients Stated Pain Goal: 0 (11/20/21 5110) ? ?Complications: No notable events documented. ?

## 2021-11-20 NOTE — Interval H&P Note (Signed)
UROLOGY H&P UPDATE ? ?Agree with prior H&P dated 11/03/21.  Large enhancing 8 cm right renal mass consistent with RCC, with subtly enhancing <1cm lymph nodes that may represent early metastatic disease.  ? ?Cardiac: RRR ?Lungs: CTA bilaterally ? ?Laterality: Right ?Procedure: Right laparoscopic hand-assisted laparoscopic radical nephrectomy ? ?Informed consent obtained, we specifically discussed the risks of bleeding, infection, damage to surrounding structures, post-operative pain, recurrence, need for close monitoring moving forward, possible need for adjuvant immunotherapy with oncology, and potential need for additional procedures. ? ?Billey Co, MD ?11/20/2021 ? ? ?

## 2021-11-20 NOTE — Progress Notes (Signed)
Notified Patient husband Ronalee Belts that patient is doing well, and is going to her room assigned 209 on the second floor. ?

## 2021-11-20 NOTE — Anesthesia Postprocedure Evaluation (Signed)
Anesthesia Post Note ? ?Patient: Gabrielle Savage ? ?Procedure(s) Performed: HAND ASSISTED LAPAROSCOPIC RADICAL NEPHRECTOMY (Right) ? ?Patient location during evaluation: PACU ?Anesthesia Type: General ?Level of consciousness: awake and alert ?Pain management: pain level controlled ?Vital Signs Assessment: post-procedure vital signs reviewed and stable ?Respiratory status: spontaneous breathing, nonlabored ventilation, respiratory function stable and patient connected to nasal cannula oxygen ?Cardiovascular status: blood pressure returned to baseline and stable ?Postop Assessment: no apparent nausea or vomiting ?Anesthetic complications: no ? ? ?No notable events documented. ? ? ?Last Vitals:  ?Vitals:  ? 11/20/21 1514 11/20/21 1946  ?BP: (!) 161/76 (!) 167/58  ?Pulse: 71 66  ?Resp: 18 20  ?Temp: 37 ?C 36.6 ?C  ?SpO2: 91% 96%  ?  ?Last Pain:  ?Vitals:  ? 11/20/21 1954  ?TempSrc:   ?PainSc: 5   ? ? ?  ?  ?  ?  ?  ?  ? ?Martha Clan ? ? ? ? ?

## 2021-11-21 ENCOUNTER — Encounter: Payer: Self-pay | Admitting: Urology

## 2021-11-21 DIAGNOSIS — Z823 Family history of stroke: Secondary | ICD-10-CM | POA: Diagnosis not present

## 2021-11-21 DIAGNOSIS — E559 Vitamin D deficiency, unspecified: Secondary | ICD-10-CM | POA: Diagnosis present

## 2021-11-21 DIAGNOSIS — I2510 Atherosclerotic heart disease of native coronary artery without angina pectoris: Secondary | ICD-10-CM | POA: Diagnosis present

## 2021-11-21 DIAGNOSIS — Z90710 Acquired absence of both cervix and uterus: Secondary | ICD-10-CM | POA: Diagnosis not present

## 2021-11-21 DIAGNOSIS — N3281 Overactive bladder: Secondary | ICD-10-CM | POA: Diagnosis present

## 2021-11-21 DIAGNOSIS — E1122 Type 2 diabetes mellitus with diabetic chronic kidney disease: Secondary | ICD-10-CM | POA: Diagnosis present

## 2021-11-21 DIAGNOSIS — Z20822 Contact with and (suspected) exposure to covid-19: Secondary | ICD-10-CM | POA: Diagnosis present

## 2021-11-21 DIAGNOSIS — E785 Hyperlipidemia, unspecified: Secondary | ICD-10-CM | POA: Diagnosis present

## 2021-11-21 DIAGNOSIS — I129 Hypertensive chronic kidney disease with stage 1 through stage 4 chronic kidney disease, or unspecified chronic kidney disease: Secondary | ICD-10-CM | POA: Diagnosis present

## 2021-11-21 DIAGNOSIS — Z8249 Family history of ischemic heart disease and other diseases of the circulatory system: Secondary | ICD-10-CM | POA: Diagnosis not present

## 2021-11-21 DIAGNOSIS — Z9049 Acquired absence of other specified parts of digestive tract: Secondary | ICD-10-CM | POA: Diagnosis not present

## 2021-11-21 DIAGNOSIS — C641 Malignant neoplasm of right kidney, except renal pelvis: Secondary | ICD-10-CM | POA: Diagnosis present

## 2021-11-21 DIAGNOSIS — N1832 Chronic kidney disease, stage 3b: Secondary | ICD-10-CM | POA: Diagnosis present

## 2021-11-21 DIAGNOSIS — N2889 Other specified disorders of kidney and ureter: Secondary | ICD-10-CM | POA: Diagnosis present

## 2021-11-21 DIAGNOSIS — D6489 Other specified anemias: Secondary | ICD-10-CM | POA: Diagnosis present

## 2021-11-21 DIAGNOSIS — H9193 Unspecified hearing loss, bilateral: Secondary | ICD-10-CM | POA: Diagnosis present

## 2021-11-21 LAB — BASIC METABOLIC PANEL
Anion gap: 8 (ref 5–15)
BUN: 28 mg/dL — ABNORMAL HIGH (ref 8–23)
CO2: 22 mmol/L (ref 22–32)
Calcium: 9.5 mg/dL (ref 8.9–10.3)
Chloride: 105 mmol/L (ref 98–111)
Creatinine, Ser: 1.46 mg/dL — ABNORMAL HIGH (ref 0.44–1.00)
GFR, Estimated: 38 mL/min — ABNORMAL LOW (ref >60)
Glucose, Bld: 144 mg/dL — ABNORMAL HIGH (ref 70–99)
Potassium: 5 mmol/L (ref 3.5–5.1)
Sodium: 135 mmol/L (ref 135–145)

## 2021-11-21 LAB — CBC
HCT: 26.4 % — ABNORMAL LOW (ref 36.0–46.0)
Hemoglobin: 7.8 g/dL — ABNORMAL LOW (ref 12.0–15.0)
MCH: 25.4 pg — ABNORMAL LOW (ref 26.0–34.0)
MCHC: 29.5 g/dL — ABNORMAL LOW (ref 30.0–36.0)
MCV: 86 fL (ref 80.0–100.0)
Platelets: 246 10*3/uL (ref 150–400)
RBC: 3.07 MIL/uL — ABNORMAL LOW (ref 3.87–5.11)
RDW: 15.6 % — ABNORMAL HIGH (ref 11.5–15.5)
WBC: 7.5 10*3/uL (ref 4.0–10.5)
nRBC: 0 % (ref 0.0–0.2)

## 2021-11-21 LAB — GLUCOSE, CAPILLARY
Glucose-Capillary: 141 mg/dL — ABNORMAL HIGH (ref 70–99)
Glucose-Capillary: 127 mg/dL — ABNORMAL HIGH (ref 70–99)

## 2021-11-21 MED ORDER — HYDROCODONE-ACETAMINOPHEN 5-325 MG PO TABS: 1.0000 | ORAL_TABLET | Freq: Four times a day (QID) | ORAL | 0 refills | Status: DC | PRN

## 2021-11-21 MED ORDER — DOCUSATE SODIUM 100 MG PO CAPS: 100.0000 mg | ORAL_CAPSULE | Freq: Two times a day (BID) | ORAL | 0 refills | Status: DC

## 2021-11-21 NOTE — Progress Notes (Signed)
? ?  Subjective ?Pain well controlled overnight, no complaints this morning ?Tolerating clears, denies flatus or BM ?Denies nausea ?Ambulated yesterday ? ?Physical Exam: ?BP (!) 139/59 (BP Location: Right Arm)   Pulse 63   Temp (!) 97.5 ?F (36.4 ?C) (Oral)   Resp 18   Ht '5\' 2"'$  (1.575 m)   Wt 78.7 kg   SpO2 97%   BMI 31.73 kg/m?   ? ?Constitutional:  Alert and oriented, No acute distress. ?Respiratory: Normal respiratory effort, no increased work of breathing. ?GI: Abdomen is soft, minimally-tender, non-distended, incision clean dry and intact in right lower quadrant and 2 port sites at midline ?Foley with clear yellow urine ? ?Laboratory Data: ?Reviewed in epic, renal function stable ? ? ?Assessment & Plan:   ?76 year old female POD#1 right laparoscopic hand-assisted radical nephrectomy for 8 cm renal mass, recovering appropriately ? ?-Advance to general diet this morning, DC mIVF, remove foley ?-Ambulate, IS ?-Anticipate discharge later this afternoon if tolerating general diet ?-We will call with pathology results, has 1 week follow-up with oncology to discuss adjuvant immunotherapy ?-Has clinic follow-up in 4 weeks for wound check ? ? ?Billey Co, MD ? ? ? ?

## 2021-11-21 NOTE — TOC Transition Note (Signed)
Transition of Care (TOC) - CM/SW Discharge Note ? ? ?Patient Details  ?Name: Gabrielle Savage ?MRN: [id number] ?Date of Birth: 02/04/1946 ? ?Transition of Care (TOC) CM/SW Contact:  ?Izola Price, RN ?Phone Number: ?11/21/2021, 10:09 AM ? ? ?Clinical Narrative:  11/21/21: S/P Right laparoscopic hand-assisted radical nephrectomy. DC orders in Epic at 0904. OBS status at 22h50M. No TOC needs ordered or identified for current discharge plan.  Husband will transport home and is at bedside. [name] RN CM  ? ? ? ?Final next level of care: Home/Self Care ?Barriers to Discharge: Barriers Resolved ? ? ?Patient Goals and CMS Choice ?  ?  ?Choice offered to / list presented to : NA ? ?Discharge Placement ?  ?           ?  ?  ?  ?  ? ?Discharge Plan and Services ?  ?  ?           ?DME Arranged: N/A ?DME Agency: NA ?  ?  ?  ?HH Arranged: NA ?[place] Agency: NA ?  ?  ?  ? ?Social Determinants of Health (SDOH) Interventions ?  ? ? ?Readmission Risk Interventions ?No flowsheet data found. ? ? ? ? ?

## 2021-11-21 NOTE — Discharge Summary (Signed)
Physician Discharge Summary  ?Patient ID: ?Gabrielle Savage ?MRN: [id number] ?DOB/AGE: 1945-09-16 76 y.o. ? ?Admit date: 11/20/2021 ?Discharge date: 11/21/2021 ? ?Admission Diagnoses: right renal mass ? ?Discharge Diagnoses: same ? ?Discharged Condition: good ? ?Hospital Course:  ?76 yo F with 8cm enhancing right renal mass who underwent uncomplicated right hand assisted laparoscopic radical nephrectomy. POD#1 tolerating general diet, pain controlled, ambulating, and was discharged to home. ? ?Discharge Exam: ?Blood pressure (!) 139/59, pulse 63, temperature (!) 97.5 ?F (36.4 ?C), temperature source Oral, resp. rate 18, height '5\' 2"'$  (1.575 m), weight 78.7 kg, SpO2 97 %. ? ?Alert, NAD ?Abdomen soft, minimally tender, incisions C/D/I ? ?Disposition: Discharge disposition: 01-Home or Self Care ? ? ? ? ? ? ?Discharge Instructions   ? ? Discharge instructions   Complete by: As directed ?  ? You underwent a laparoscopic right radical nephrectomy for a kidney mass. ? ?Recommend avoiding heavy meals and fatty foods for the first 1 to 2 weeks as you recover from surgery.  Take daily walks and use your incentive spirometer.  This can help prevent blood clots in the legs and pneumonia.  Drink plenty of fluids to keep your remaining kidney hydrated.  No strenuous activity or heavy lifting more than 15 pounds for 6 weeks.  Okay to shower starting Sunday 3/12, wash your incisions gently with soap and water and pat dry.  The glue will fall off on its own over the next month.  No tub baths or swimming for 4 weeks.  If you are having problems with constipation at home, okay to do an over-the-counter suppository in the next few days. ? ?Recommend using Tylenol primarily for pain control, and take the narcotic as needed.  Be cautious as this can cause constipation and upset stomach.  Avoid NSAIDs as these can cause damage to your remaining kidney. ? ?If you have nausea and vomiting, severe abdominal pain or bloating, chest pain, or  shortness of breath please call the clinic or present to the ER. ? ?We will call with your pathology results, and keep your follow-up appointment with oncology.  You should have an appointment with urology in about 1 month for wound check. ? ?Urology clinic number: [phone]  ? Discharge patient   Complete by: As directed ?  ? Patient can discharge afternoon of 11/21/2021 if tolerating general diet and pain controlled  ? Discharge disposition: 01-Home or Self Care  ? Discharge patient date: 11/21/2021  ? ?  ? ?Allergies as of 11/21/2021   ? ?   Reactions  ? Codeine Nausea Only  ? Nsaids Other (See Comments)  ? Bad kidneys  ? Oxycodone Nausea Only  ? Propofol Other (See Comments)  ? Pt had propofol for colonoscopy with no reaction ?Kidney disease  ? ?  ? ?  ?Medication List  ?  ? ?TAKE these medications   ? ?allopurinol 300 MG tablet ?Commonly known as: ZYLOPRIM ?Take 300 mg by mouth every morning. ?  ?amLODipine 5 MG tablet ?Commonly known as: NORVASC ?Take 5 mg by mouth every morning. ?  ?citalopram 10 MG tablet ?Commonly known as: CELEXA ?Take 10 mg by mouth every morning. ?  ?docusate sodium 100 MG capsule ?Commonly known as: COLACE ?Take 1 capsule (100 mg total) by mouth 2 (two) times daily. ?  ?ergocalciferol 1.25 MG (50000 UT) capsule ?Commonly known as: VITAMIN D2 ?Take 50,000 Units by mouth every Saturday. ?  ?ferrous sulfate 325 (65 FE) MG tablet ?Take 325 mg by mouth daily. ?  ?  HYDROcodone-acetaminophen 5-325 MG tablet ?Commonly known as: NORCO/VICODIN ?Take 1 tablet by mouth every 6 (six) hours as needed for moderate pain. ?  ?irbesartan 150 MG tablet ?Commonly known as: AVAPRO ?Take 150 mg by mouth every morning. ?  ?metFORMIN 500 MG tablet ?Commonly known as: GLUCOPHAGE ?Take 500 mg by mouth every morning. ?  ?vitamin B-12 1000 MCG tablet ?Commonly known as: CYANOCOBALAMIN ?Take 1,000 mcg by mouth daily. ?  ? ?  ? ? ?Signed: ?Billey Co ?11/21/2021, 9:04 AM ? ? ?

## 2021-11-21 NOTE — Progress Notes (Signed)
Gabrielle Savage to be D/C'd Home per MD order.  Discussed prescriptions and follow up appointments with the patient. Prescriptions given to patient, medication list explained in detail. Pt verbalized understanding. ? ?Allergies as of 11/21/2021   ? ?   Reactions  ? Codeine Nausea Only  ? Nsaids Other (See Comments)  ? Bad kidneys  ? Oxycodone Nausea Only  ? Propofol Other (See Comments)  ? Pt had propofol for colonoscopy with no reaction ?Kidney disease  ? ?  ? ?  ?Medication List  ?  ? ?TAKE these medications   ? ?allopurinol 300 MG tablet ?Commonly known as: ZYLOPRIM ?Take 300 mg by mouth every morning. ?  ?amLODipine 5 MG tablet ?Commonly known as: NORVASC ?Take 5 mg by mouth every morning. ?  ?citalopram 10 MG tablet ?Commonly known as: CELEXA ?Take 10 mg by mouth every morning. ?  ?docusate sodium 100 MG capsule ?Commonly known as: COLACE ?Take 1 capsule (100 mg total) by mouth 2 (two) times daily. ?  ?ergocalciferol 1.25 MG (50000 UT) capsule ?Commonly known as: VITAMIN D2 ?Take 50,000 Units by mouth every Saturday. ?  ?ferrous sulfate 325 (65 FE) MG tablet ?Take 325 mg by mouth daily. ?  ?HYDROcodone-acetaminophen 5-325 MG tablet ?Commonly known as: NORCO/VICODIN ?Take 1 tablet by mouth every 6 (six) hours as needed for moderate pain. ?  ?irbesartan 150 MG tablet ?Commonly known as: AVAPRO ?Take 150 mg by mouth every morning. ?  ?metFORMIN 500 MG tablet ?Commonly known as: GLUCOPHAGE ?Take 500 mg by mouth every morning. ?  ?vitamin B-12 1000 MCG tablet ?Commonly known as: CYANOCOBALAMIN ?Take 1,000 mcg by mouth daily. ?  ? ?  ? ? ?Vitals:  ? 11/21/21 0417 11/21/21 0804  ?BP: (!) 156/70 (!) 139/59  ?Pulse: 65 63  ?Resp: 18 18  ?Temp: (!) 97.5 ?F (36.4 ?C) (!) 97.5 ?F (36.4 ?C)  ?SpO2: 97% 97%  ? ? ?Skin clean, dry and intact without evidence of skin break down, no evidence of skin tears noted. IV catheter discontinued intact. Site without signs and symptoms of complications. Dressing and pressure applied. Pt  denies pain at this time. No complaints noted. ? ?An After Visit Summary was printed and given to the patient. ?Patient escorted via [place], and D/C home via private auto. ? ?Warba C. Vanua  ?

## 2021-11-25 ENCOUNTER — Other Ambulatory Visit: Payer: Self-pay

## 2021-11-25 ENCOUNTER — Inpatient Hospital Stay: Payer: Medicare Other | Attending: Internal Medicine | Admitting: Hospice and Palliative Medicine

## 2021-11-25 DIAGNOSIS — Z79899 Other long term (current) drug therapy: Secondary | ICD-10-CM | POA: Insufficient documentation

## 2021-11-25 DIAGNOSIS — C641 Malignant neoplasm of right kidney, except renal pelvis: Secondary | ICD-10-CM | POA: Insufficient documentation

## 2021-11-25 DIAGNOSIS — N183 Chronic kidney disease, stage 3 unspecified: Secondary | ICD-10-CM | POA: Insufficient documentation

## 2021-11-25 DIAGNOSIS — Z905 Acquired absence of kidney: Secondary | ICD-10-CM | POA: Insufficient documentation

## 2021-11-25 DIAGNOSIS — R911 Solitary pulmonary nodule: Secondary | ICD-10-CM | POA: Insufficient documentation

## 2021-11-25 DIAGNOSIS — C649 Malignant neoplasm of unspecified kidney, except renal pelvis: Secondary | ICD-10-CM

## 2021-11-25 DIAGNOSIS — I129 Hypertensive chronic kidney disease with stage 1 through stage 4 chronic kidney disease, or unspecified chronic kidney disease: Secondary | ICD-10-CM | POA: Insufficient documentation

## 2021-11-25 NOTE — Progress Notes (Signed)
Multidisciplinary Oncology Council Documentation ? ?Gabrielle Savage was presented by our [facility] on 11/25/2021, which included representatives from:  ?Palliative Care ?Dietitian  ?Physical/Occupational Therapist ?Nurse Navigator ?Genetics ?Speech Therapist ?Social work ?Survivorship RN ?Financial Navigator ?Research RN ? ? ?Gabrielle Savage currently presents with history of kidney cancer. ? ?We reviewed previous medical and familial history, history of present illness, and recent lab results along with all available histopathologic and imaging studies. The [place] considered available treatment options and made the following recommendations/referrals: ? ?Social work ? ?The MOC is a meeting of clinicians from various specialty areas who evaluate and discuss patients for whom a multidisciplinary approach is being considered. Final determinations in the plan of care are those of the provider(s).  ? ?Today's extended care, comprehensive team conference, Gabrielle Savage was not present for the discussion and was not examined.   ?

## 2021-12-04 LAB — SURGICAL PATHOLOGY

## 2021-12-07 ENCOUNTER — Other Ambulatory Visit: Payer: Self-pay | Admitting: Anatomic Pathology & Clinical Pathology

## 2021-12-07 NOTE — Progress Notes (Signed)
[facility] Clinical Social Work  ?Initial Assessment ? ? ?Gabrielle Savage is a 76 y.o. year old female contacted by phone. Clinical Social Work was referred by medical provider for assessment of psychosocial needs.  ? ?SDOH (Social Determinants of Health) assessments performed: Yes ?SDOH Interventions   ? ?Flowsheet Row Most Recent Value  ?SDOH Interventions   ?Food Insecurity Interventions Intervention Not Indicated  ?Financial Strain Interventions Intervention Not Indicated  ?Housing Interventions Intervention Not Indicated  ?Physical Activity Interventions Intervention Not Indicated  ?Stress Interventions Intervention Not Indicated  ?Social Connections Interventions Intervention Not Indicated  ?Transportation Interventions Intervention Not Indicated  ? ?  ?  ?Distress Screen completed: Yes ? ?  10/29/2021  ?  2:20 PM  ?ONCBCN DISTRESS SCREENING  ?Screening Type Initial Screening  ?Distress experienced in past week (1-10) 8  ?Information Concerns Type Lack of info about diagnosis;Lack of info about treatment  ?Physical Problem type Loss of appetitie  ? ? ? ? ?Family/Social Information:  ?Housing Arrangement: patient lives with spouse Gabrielle, Savage [phone] ?Family members/support persons in your life? Family, Friends/Colleagues, and Community  ?Transportation concerns: no  ?Employment: Retired. Income source: [place] ?Financial concerns: No ?Type of concern: None ?Food access concerns: no ?Religious or spiritual practice: yes ?Services Currently in place:  Medicare ? ?Coping/ Adjustment to diagnosis: ?Patient understands treatment plan and what happens next? yes ?Concerns about diagnosis and/or treatment: Pain or discomfort during procedures and Overwhelmed by information ?Patient reported stressors: Anxiety and Adjusting to my illness ?Hopes and priorities: N/A ?Patient enjoys gardening, being outside, and time with family/ friends ?Current coping skills/ strengths: Average or above average  intelligence , Capable of independent living , Financial means , and Supportive family/friends  ? ? ? SUMMARY: ?Current SDOH Barriers:  ?Financial constraints related to fixed income. Patient stated she does not have an immediate concern but she is concerned about the financial stress when the medical bills start arriving.  CSW encouraged patient to contact me, and I would refer to the financial navigator and find manager, if she was concerned once the bills arrived.  Patient verbalized understanding.   ? ?Clinical Social Work Clinical Goal(s):  ?N/A ? ?Interventions: ?Discussed common feeling and emotions when being diagnosed with cancer, and the importance of support during treatment ?Informed patient of the support team roles and support services at [facility] ?Provided CSW contact information and encouraged patient to call with any questions or concerns ?Provided patient with information about CSW role in patient care and available resources. ? ? ?Follow Up Plan: Patient will contact CSW with any support or resource needs ?Patient verbalizes understanding of plan: Yes ? ? ? ?Gabrielle Perko, LCSW ?

## 2021-12-10 ENCOUNTER — Other Ambulatory Visit: Payer: Medicare Other

## 2021-12-10 NOTE — Progress Notes (Signed)
Tumor Board Documentation ? ?Gabrielle Savage was presented by Alease Medina, NP at our Tumor Board on 12/10/2021, which included representatives from medical oncology, radiation oncology, surgical, radiology, pathology, navigation, internal medicine, palliative care, research, genetics, pulmonology. ? ?Gabrielle Savage currently presents as a current patient, for [place], for new positive pathology with history of the following treatments: surgical intervention(s) (Nephrectomy). ? ?Additionally, we reviewed previous medical and familial history, history of present illness, and recent lab results along with all available histopathologic and imaging studies. The tumor board considered available treatment options and made the following recommendations: ?Chemotherapy ?  ? ?The following procedures/referrals were also placed: No orders of the defined types were placed in this encounter. ? ? ?Clinical Trial Status: not discussed  ? ?Staging used: AJCC Stage Group ?AJCC Staging: ?T: 3A ?  ?  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 ? ?National site-specific guidelines NCCN were discussed with respect to the case. ? ?Tumor board is a meeting of clinicians from various specialty areas who evaluate and discuss patients for whom a multidisciplinary approach is being considered. Final determinations in the plan of care are those of the provider(s). The responsibility for follow up of recommendations given during tumor board is that of the provider.  ? ?Today?s extended care, comprehensive team conference, Gabrielle Savage was not present for the discussion and was not examined.  ? ?Multidisciplinary Tumor Board is a multidisciplinary case peer review process.  Decisions discussed in the Multidisciplinary Tumor Board reflect the opinions of the specialists present at the conference without having examined the patient.  Ultimately, treatment and diagnostic decisions rest with the primary provider(s) and the patient. ? ?

## 2021-12-11 ENCOUNTER — Inpatient Hospital Stay: Payer: Medicare Other

## 2021-12-11 ENCOUNTER — Encounter: Payer: Self-pay | Admitting: Nurse Practitioner

## 2021-12-11 ENCOUNTER — Other Ambulatory Visit: Payer: Self-pay

## 2021-12-11 ENCOUNTER — Inpatient Hospital Stay (HOSPITAL_BASED_OUTPATIENT_CLINIC_OR_DEPARTMENT_OTHER): Payer: Medicare Other | Admitting: Nurse Practitioner

## 2021-12-11 VITALS — BP 133/74 | HR 63 | Temp 98.7°F | Resp 16 | Wt 172.4 lb

## 2021-12-11 DIAGNOSIS — N2889 Other specified disorders of kidney and ureter: Secondary | ICD-10-CM

## 2021-12-11 DIAGNOSIS — N183 Chronic kidney disease, stage 3 unspecified: Secondary | ICD-10-CM | POA: Diagnosis not present

## 2021-12-11 DIAGNOSIS — Z905 Acquired absence of kidney: Secondary | ICD-10-CM | POA: Diagnosis not present

## 2021-12-11 DIAGNOSIS — Z79899 Other long term (current) drug therapy: Secondary | ICD-10-CM

## 2021-12-11 DIAGNOSIS — Z7189 Other specified counseling: Secondary | ICD-10-CM

## 2021-12-11 DIAGNOSIS — C641 Malignant neoplasm of right kidney, except renal pelvis: Secondary | ICD-10-CM

## 2021-12-11 DIAGNOSIS — I129 Hypertensive chronic kidney disease with stage 1 through stage 4 chronic kidney disease, or unspecified chronic kidney disease: Secondary | ICD-10-CM | POA: Diagnosis not present

## 2021-12-11 DIAGNOSIS — R911 Solitary pulmonary nodule: Secondary | ICD-10-CM | POA: Diagnosis not present

## 2021-12-11 LAB — COMPREHENSIVE METABOLIC PANEL
ALT: 10 U/L (ref 0–44)
AST: 14 U/L — ABNORMAL LOW (ref 15–41)
Albumin: 3.5 g/dL (ref 3.5–5.0)
Alkaline Phosphatase: 116 U/L (ref 38–126)
Anion gap: 7 (ref 5–15)
BUN: 38 mg/dL — ABNORMAL HIGH (ref 8–23)
CO2: 22 mmol/L (ref 22–32)
Calcium: 9.6 mg/dL (ref 8.9–10.3)
Chloride: 107 mmol/L (ref 98–111)
Creatinine, Ser: 1.83 mg/dL — ABNORMAL HIGH (ref 0.44–1.00)
GFR, Estimated: 29 mL/min — ABNORMAL LOW (ref >60)
Glucose, Bld: 141 mg/dL — ABNORMAL HIGH (ref 70–99)
Potassium: 4.3 mmol/L (ref 3.5–5.1)
Sodium: 136 mmol/L (ref 135–145)
Total Bilirubin: 0.2 mg/dL — ABNORMAL LOW (ref 0.3–1.2)
Total Protein: 6.8 g/dL (ref 6.5–8.1)

## 2021-12-11 LAB — CBC WITH DIFFERENTIAL/PLATELET
Abs Immature Granulocytes: 0.03 10*3/uL (ref 0.00–0.07)
Basophils Absolute: 0 10*3/uL (ref 0.0–0.1)
Basophils Relative: 1 %
Eosinophils Absolute: 1.8 10*3/uL — ABNORMAL HIGH (ref 0.0–0.5)
Eosinophils Relative: 23 %
HCT: 30.2 % — ABNORMAL LOW (ref 36.0–46.0)
Hemoglobin: 8.9 g/dL — ABNORMAL LOW (ref 12.0–15.0)
Immature Granulocytes: 0 %
Lymphocytes Relative: 22 %
Lymphs Abs: 1.7 10*3/uL (ref 0.7–4.0)
MCH: 26 pg (ref 26.0–34.0)
MCHC: 29.5 g/dL — ABNORMAL LOW (ref 30.0–36.0)
MCV: 88.3 fL (ref 80.0–100.0)
Monocytes Absolute: 0.6 10*3/uL (ref 0.1–1.0)
Monocytes Relative: 8 %
Neutro Abs: 3.7 10*3/uL (ref 1.7–7.7)
Neutrophils Relative %: 46 %
Platelets: 252 10*3/uL (ref 150–400)
RBC: 3.42 MIL/uL — ABNORMAL LOW (ref 3.87–5.11)
RDW: 17.9 % — ABNORMAL HIGH (ref 11.5–15.5)
WBC: 7.8 10*3/uL (ref 4.0–10.5)
nRBC: 0 % (ref 0.0–0.2)

## 2021-12-11 NOTE — Progress Notes (Signed)
Patient reports decrease appetite and wt is stable. ?

## 2021-12-11 NOTE — Progress Notes (Signed)
[facility] ?CONSULT NOTE ? ?Patient Care Team: ?Tracie Harrier, MD as PCP - General (Internal Medicine) ? ?CHIEF COMPLAINTS/PURPOSE OF CONSULTATION: Kidney mass  ? ?# FEB 14th, 2023- CT AP [GI; [facility]]- . Large mass exophytic extending from lower to mid pole of the RIGHT kidney consistent with renal neoplasm. [Dr.Sninski; [facility]] ? ?# FEB 2023- Right lower lobe subpleural 4 mm lung nodule; bone survey negative ? ? # CKD III [ sec to HTN > 20 years; none now]; Anemia- EGD/colo- 2023- [[facility]-GI]; Dr.Kowalski [No CAD] ? ?HISTORY OF PRESENTING ILLNESS: Ambulating independently.  Accompanied by husband.  ? ?Gabrielle Savage 76 y.o.  female with history of chronic kidney disease stage III; right kidney mass, now s/p nephrectomy with Dr. Diamantina Providence who presents to clinic for discussion of pathology results and treatment planning. She feels well since surgery and post-op course was uneventful.  ? ?DIAGNOSIS:  ?A. KIDNEY AND PROXIMAL URETER, RIGHT; RADICAL NEPHRECTOMY:  ?- CLEAR CELL RENAL CELL CARCINOMA.  ? ?Comment:  ?The tumor shows variability in morphology and a very prominent plasma  ?cell infiltrate.  ?Immunohistochemistry (IHC) for classification was performed, with  ?following result:  ?CAIX (Carbonic Anhydrase 9): Positive, strong membranous staining in  ?over 90% of tumor cells  ?CK7 (Cytokeratin 7): Negative  ?The IHC profile confirms the above diagnosis.  ? ?CANCER CASE SUMMARY: KIDNEY  ?Standard(s): AJCC-UICC 8  ? ?SPECIMEN  ?Procedure: Radical nephrectomy  ?Specimen Laterality: Right  ? ?TUMOR  ?Tumor Focality: Unifocal  ?Tumor Size: Greatest dimension 9.3 cm  ?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  ?Histologic Grade (WHO / ISUP Grade): G3, nucleoli conspicuous and  ?eosinophilic at 003 X magnification  ?Tumor Extent: Extends into major vein (segmental branch adjacent to  ?renal sinus)  ?Sarcomatoid Features: Not identified  ?Rhabdoid Features: Not identified  ?Tumor Necrosis: Present,  microscopic and macroscopic, 20%  ? ?MARGINS  ?Margin Status: All margins negative for invasive carcinoma  ? ?REGIONAL LYMPH NODES  ?Regional Lymph Node Status: Not applicable (no regional lymph nodes  ?submitted or found)  ? ?DISTANT METASTASIS  ?Distant Site(s) Involved, if applicable: Not applicable  ? ?PATHOLOGIC STAGE CLASSIFICATION (pTNM, AJCC 8th Edition):  ?TNM Descriptors: Applicable  ?pT3a  ?pN - Not assigned (no lymph nodes submitted or found)  ?pM - Not applicable  ? ?ADDITIONAL FINDINGS  ?Additional Pathologic Findings in Non-neoplastic Kidney: 20% globally  ?sclerotic glomeruli,  ?Moderate arterial intimal fibrosis  ? ?Her case was presented at tumor board yesterday.  ? ? ?Review of Systems  ?Constitutional:  Positive for malaise/fatigue. Negative for chills, fever and weight loss.  ?HENT:  Negative for hearing loss, nosebleeds, sore throat and tinnitus.   ?Eyes:  Negative for blurred vision and double vision.  ?Respiratory:  Negative for cough, hemoptysis, shortness of breath and wheezing.   ?Cardiovascular:  Negative for chest pain, palpitations and leg swelling.  ?Gastrointestinal:  Negative for abdominal pain, blood in stool, constipation, diarrhea, melena, nausea and vomiting.  ?Genitourinary:  Negative for dysuria and urgency.  ?Musculoskeletal:  Negative for back pain, falls, joint pain and myalgias.  ?Skin:  Negative for itching and rash.  ?Neurological:  Negative for dizziness, tingling, sensory change, loss of consciousness, weakness and headaches.  ?Endo/Heme/Allergies:  Negative for environmental allergies. Does not bruise/bleed easily.  ?Psychiatric/Behavioral:  Negative for depression. The patient is not nervous/anxious and does not have insomnia.    ? ?MEDICAL HISTORY:  ?Past Medical History:  ?Diagnosis Date  ? Aortic atherosclerosis ([place])   ?  Arthritis   ? B12 deficiency   ? Bradycardia   ? CAD (coronary artery disease)   ? Carotid atherosclerosis   ? CKD (chronic kidney disease),  stage III ([place])   ? Diastolic dysfunction 42/68/3419  ? a.)  TTE 09/03/2021: EF 63%; no RWMAs; LA mildly enlarged; trivial TR/PR, mild AR, moderate MR; G1DD.  ? DOE (dyspnea on exertion)   ? Fatigue   ? Gout   ? HLD (hyperlipidemia)   ? Hypertension   ? IDA (iron deficiency anemia)   ? Iliotibial band syndrome, left leg   ? MDD (major depressive disorder)   ? OAB (overactive bladder)   ? Obesity   ? PAC (premature atrial contraction)   ? noted on Holter  ? PONV (postoperative nausea and vomiting)   ? a.) single episode in 03/2013  ? PVC (premature ventricular contraction)   ? noted on Holter  ? Right kidney mass 10/27/2021  ? a.) CT 10/27/2021 -- mass measuring 8.3 x 6.8 x 8.1 cm  ? Sensorineural hearing loss (SNHL) of both ears   ? SUI (stress urinary incontinence, female)   ? SVT (supraventricular tachycardia) ([place])   ? noted on Holter  ? T2DM (type 2 diabetes mellitus) ([place])   ? Uterovaginal prolapse   ? Vitamin D deficiency   ? Voiding dysfunction   ? ? ?SURGICAL HISTORY: ?Past Surgical History:  ?Procedure Laterality Date  ? ABDOMINAL HYSTERECTOMY    ? partial  ? BLADDER SUSPENSION    ? CHOLECYSTECTOMY    ? COLONOSCOPY N/A 07/25/2017  ? Procedure: COLONOSCOPY;  Surgeon: [name], MD;  [facility] END[facility]COPY;  Service: Endoscopy;  Laterality: N/A;  ? COLONOSCOPY WITH PROPOFOL N/A 10/19/2021  ? Procedure: COLONOSCOPY WITH PROPOFOL;  Surgeon: [name], MD;  Location[facility] ENDOSCOPY;  Service: Endoscopy;  Laterality: N/A;  DM  ? ESOPHAGOGASTRODUODENOSCOPY (EGD) WITH PROPOFOL N/A 10/19/2021  ? Procedure: ESOPHAGOGASTRODUODENOSCOPY (EGD) WITH PROPOFOL;  Surgeon: [name], MD;  Location: [facility] ENDOSCOPY;  Service: Endoscopy;  Laterality: N/A;  ? JOINT REPLACEMENT Bilateral   ? PARTIAL KNEE REPLACEMENT  ? KNEE SURGERY    ? LAPAROSCOPIC NEPHRECTOMY, HAND ASSISTED Right 11/20/2021  ? Procedure: HAND ASSISTED LAPAROSCOPIC RADICAL NEPHRECTOMY;  Surgeon: Billey Co, MD;  Location: [facility]  ORS;  Service: Urology;  Laterality: Right;  ? PARATHYROIDECTOMY    ? ? ?SOCIAL HISTORY: ?Social History  ? ?Socioeconomic History  ? Marital status: Married  ?  Spouse name: Not on file  ? Number of children: Not on file  ? Years of education: Not on file  ? Highest education level: Not on file  ?Occupational History  ? Not on file  ?Tobacco Use  ? Smoking status: Never  ? Smokeless tobacco: Never  ?Vaping Use  ? Vaping Use: Never used  ?Substance and Sexual Activity  ? Alcohol use: Not Currently  ?  Comment: occ  ? Drug use: No  ? Sexual activity: Not Currently  ?Other Topics Concern  ? Not on file  ?Social History Narrative  ? Lives in [place] - close hospital; with husband; 3 biological children [[place]; [place]; [place]]. Never smoked; glass of wine rare. 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   ? ?Social Determinants of Health  ? ?Financial Resource Strain: Low Risk   ? Difficulty of Paying Living Expenses: Not hard at all  ?Food Insecurity: No Food Insecurity  ? Worried About Charity fundraiser in the Last Year: Never true  ? Ran Out of Food  in the Last Year: Never true  ?Transportation Needs: No Transportation Needs  ? Lack of Transportation (Medical): No  ? Lack of Transportation (Non-Medical): No  ?Physical Activity: Insufficiently Active  ? Days of Exercise per Week: 2 days  ? Minutes of Exercise per Session: 30 min  ?Stress: No Stress Concern Present  ? Feeling of Stress : Only a little  ?Social Connections: Moderately Integrated  ? Frequency of Communication with Friends and Family: Three times a week  ? Frequency of Social Gatherings with Friends and Family: Three times a week  ? Attends Religious Services: 1 to 4 times per year  ? Active Member of Clubs or Organizations: No  ? Attends Archivist Meetings: Never  ? Marital Status: Married  ?Intimate Partner Violence: Not At Risk  ? Fear of Current or Ex-Partner: No  ? Emotionally Abused: No  ? Physically Abused: No  ? Sexually Abused: No   ? ? ?FAMILY HISTORY: ?Family History  ?Problem Relation Age of Onset  ? Heart failure Mother   ? Stroke Father   ? Breast cancer Neg Hx   ? ? ?ALLERGIES:  is allergic to codeine, nsaids, oxycodone, and propo

## 2021-12-12 ENCOUNTER — Encounter: Payer: Self-pay | Admitting: Internal Medicine

## 2021-12-12 DIAGNOSIS — C641 Malignant neoplasm of right kidney, except renal pelvis: Secondary | ICD-10-CM | POA: Insufficient documentation

## 2021-12-12 DIAGNOSIS — E119 Type 2 diabetes mellitus without complications: Secondary | ICD-10-CM | POA: Insufficient documentation

## 2021-12-12 DIAGNOSIS — M109 Gout, unspecified: Secondary | ICD-10-CM | POA: Insufficient documentation

## 2021-12-12 MED ORDER — ONDANSETRON HCL 8 MG PO TABS: 8.0000 mg | ORAL_TABLET | Freq: Two times a day (BID) | ORAL | 1 refills | Status: DC | PRN

## 2021-12-12 MED ORDER — PROCHLORPERAZINE MALEATE 10 MG PO TABS: 10.0000 mg | ORAL_TABLET | Freq: Four times a day (QID) | ORAL | 1 refills | Status: DC | PRN

## 2021-12-12 NOTE — Progress Notes (Signed)
START ON PATHWAY REGIMEN - Renal Cell     A cycle is every 21 days:     Pembrolizumab   **Always confirm dose/schedule in your pharmacy ordering system**  Patient Characteristics: Postoperative without Neoadjuvant Therapy, M0 (Pathologic Staging), Stage I, II, III, or Resected T4M0 Stage IV, Intermediate-High Risk Therapeutic Status: Postoperative without Neoadjuvant Therapy, M0 (Pathologic Staging) AJCC M Category: cM0 AJCC 8 Stage Grouping: III AJCC T Category: pT3a AJCC N Category: pN0 Risk Status: Intermediate-High Risk Intent of Therapy: Curative Intent, Discussed with Patient 

## 2021-12-15 ENCOUNTER — Inpatient Hospital Stay: Payer: Medicare Other | Attending: Nurse Practitioner

## 2021-12-15 ENCOUNTER — Telehealth: Payer: Self-pay

## 2021-12-15 DIAGNOSIS — Z79899 Other long term (current) drug therapy: Secondary | ICD-10-CM | POA: Insufficient documentation

## 2021-12-15 DIAGNOSIS — Z905 Acquired absence of kidney: Secondary | ICD-10-CM | POA: Insufficient documentation

## 2021-12-15 DIAGNOSIS — Z5112 Encounter for antineoplastic immunotherapy: Secondary | ICD-10-CM | POA: Insufficient documentation

## 2021-12-15 DIAGNOSIS — C641 Malignant neoplasm of right kidney, except renal pelvis: Secondary | ICD-10-CM | POA: Insufficient documentation

## 2021-12-15 NOTE — Telephone Encounter (Signed)
Research RN called patient at the request of Beckey Rutter, NP to introduce the [facility] 2013 protocol to her and ascertain her interest in participation. Message left for patient with explanation and for her to return call at her earliest convenience to learn more.  ?Jeral Fruit, RN ?12/15/21 ?4:11 PM ? ?

## 2021-12-17 ENCOUNTER — Ambulatory Visit (INDEPENDENT_AMBULATORY_CARE_PROVIDER_SITE_OTHER): Payer: Medicare Other | Admitting: Urology

## 2021-12-17 ENCOUNTER — Encounter: Payer: Self-pay | Admitting: Urology

## 2021-12-17 VITALS — BP 157/85 | HR 69 | Ht 62.0 in | Wt 171.0 lb

## 2021-12-17 DIAGNOSIS — C641 Malignant neoplasm of right kidney, except renal pelvis: Secondary | ICD-10-CM

## 2021-12-17 NOTE — Progress Notes (Signed)
? ?  12/17/2021 ?11:02 AM  ? ?Gabrielle Savage ?[birth year] ?[id number] ? ?Reason for visit: Follow up kidney cancer, CKD ? ?HPI: ?76 year old female who presented with a 8cm right renal mass worrisome for RCC, and some possible small retroperitoneal lymph nodes that were indeterminate, as well as an indeterminate 4 mm right lower lobe subpleural nodule.  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 ? ?She has been recovering well and has resumed almost all activities and tolerating a general diet.  Incisions are healing well with no evidence of erythema or infection. ? ?Renal function postop was stable with a creatinine of 1.46(eGFR 38) from creatinine of 1.4 preop, but most recent creatinine 12/11/2021 bumped to 1.83 with EGFR 29.  We discussed importance of adequate hydration, blood pressure control, and avoiding NSAIDs. ? ?She is set up with oncology and is planning to undergo adjuvant pembrolizumab, and has repeat staging imaging with oncology. ? ?RTC 6 months ? ?Billey Co, MD ? ?[facility] ?[address], [address] ?[place], [place] [address] ?[phone] ? ? ?

## 2021-12-18 ENCOUNTER — Inpatient Hospital Stay: Payer: Medicare Other

## 2021-12-18 ENCOUNTER — Inpatient Hospital Stay (HOSPITAL_BASED_OUTPATIENT_CLINIC_OR_DEPARTMENT_OTHER): Payer: Medicare Other | Admitting: Nurse Practitioner

## 2021-12-18 ENCOUNTER — Other Ambulatory Visit: Payer: Self-pay | Admitting: Oncology

## 2021-12-18 VITALS — BP 148/67 | HR 65 | Temp 97.2°F | Resp 16 | Ht 62.0 in | Wt 173.9 lb

## 2021-12-18 DIAGNOSIS — C641 Malignant neoplasm of right kidney, except renal pelvis: Secondary | ICD-10-CM | POA: Diagnosis present

## 2021-12-18 DIAGNOSIS — Z79899 Other long term (current) drug therapy: Secondary | ICD-10-CM | POA: Diagnosis not present

## 2021-12-18 DIAGNOSIS — Z5112 Encounter for antineoplastic immunotherapy: Secondary | ICD-10-CM

## 2021-12-18 DIAGNOSIS — Z905 Acquired absence of kidney: Secondary | ICD-10-CM | POA: Diagnosis not present

## 2021-12-18 LAB — CBC WITH DIFFERENTIAL/PLATELET
Abs Immature Granulocytes: 0.03 10*3/uL (ref 0.00–0.07)
Basophils Absolute: 0 10*3/uL (ref 0.0–0.1)
Basophils Relative: 1 %
Eosinophils Absolute: 0.6 10*3/uL — ABNORMAL HIGH (ref 0.0–0.5)
Eosinophils Relative: 10 %
HCT: 30.6 % — ABNORMAL LOW (ref 36.0–46.0)
Hemoglobin: 9.2 g/dL — ABNORMAL LOW (ref 12.0–15.0)
Immature Granulocytes: 1 %
Lymphocytes Relative: 30 %
Lymphs Abs: 1.9 10*3/uL (ref 0.7–4.0)
MCH: 26.7 pg (ref 26.0–34.0)
MCHC: 30.1 g/dL (ref 30.0–36.0)
MCV: 88.7 fL (ref 80.0–100.0)
Monocytes Absolute: 0.5 10*3/uL (ref 0.1–1.0)
Monocytes Relative: 7 %
Neutro Abs: 3.2 10*3/uL (ref 1.7–7.7)
Neutrophils Relative %: 51 %
Platelets: 224 10*3/uL (ref 150–400)
RBC: 3.45 MIL/uL — ABNORMAL LOW (ref 3.87–5.11)
RDW: 18.9 % — ABNORMAL HIGH (ref 11.5–15.5)
WBC: 6.3 10*3/uL (ref 4.0–10.5)
nRBC: 0 % (ref 0.0–0.2)

## 2021-12-18 LAB — COMPREHENSIVE METABOLIC PANEL
ALT: 8 U/L (ref 0–44)
AST: 11 U/L — ABNORMAL LOW (ref 15–41)
Albumin: 3.7 g/dL (ref 3.5–5.0)
Alkaline Phosphatase: 108 U/L (ref 38–126)
Anion gap: 8 (ref 5–15)
BUN: 45 mg/dL — ABNORMAL HIGH (ref 8–23)
CO2: 19 mmol/L — ABNORMAL LOW (ref 22–32)
Calcium: 9.5 mg/dL (ref 8.9–10.3)
Chloride: 109 mmol/L (ref 98–111)
Creatinine, Ser: 1.81 mg/dL — ABNORMAL HIGH (ref 0.44–1.00)
GFR, Estimated: 29 mL/min — ABNORMAL LOW (ref >60)
Glucose, Bld: 123 mg/dL — ABNORMAL HIGH (ref 70–99)
Potassium: 4.3 mmol/L (ref 3.5–5.1)
Sodium: 136 mmol/L (ref 135–145)
Total Bilirubin: 0.1 mg/dL — ABNORMAL LOW (ref 0.3–1.2)
Total Protein: 7.1 g/dL (ref 6.5–8.1)

## 2021-12-18 LAB — HEPATITIS B SURFACE ANTIGEN: Hepatitis B Surface Ag: NONREACTIVE

## 2021-12-18 LAB — HEPATITIS B CORE ANTIBODY, TOTAL: Hep B Core Total Ab: NONREACTIVE

## 2021-12-18 LAB — TSH: TSH: 2.953 u[IU]/mL (ref 0.350–4.500)

## 2021-12-18 MED ORDER — SODIUM CHLORIDE 0.9 % IV SOLN
200.0000 mg | Freq: Once | INTRAVENOUS | Status: AC
  Administered 2021-12-18: 200 mg via INTRAVENOUS
  Filled 2021-12-18: qty 200

## 2021-12-18 MED ORDER — SODIUM CHLORIDE 0.9 % IV SOLN
Freq: Once | INTRAVENOUS | Status: AC
  Filled 2021-12-18: qty 250

## 2021-12-18 NOTE — Progress Notes (Signed)
Pt is nervous about treatment today. Pt would like to see nutritionist d/t hx of HTN and diabetes. ?

## 2021-12-18 NOTE — Progress Notes (Signed)
[facility] ?CONSULT NOTE ? ?Patient Care Team: ?Tracie Harrier, MD as PCP - General (Internal Medicine) ? ?CHIEF COMPLAINTS/PURPOSE OF CONSULTATION: Kidney mass  ? ?# FEB 14th, 2023- CT AP [GI; [facility]]- . Large mass exophytic extending from lower to mid pole of the RIGHT kidney consistent with renal neoplasm. [Dr.Sninski; [facility]log[facility]] ? ?DIAGNOSIS:  ?A. KIDNEY AND PROXIMAL URETER, RIGHT; RADICAL NEPHRECTOMY:  ?- CLEAR CELL RENAL CELL CARCINOMA.  ?Comment:  ?The tumor shows variability in morphology and a very prominent plasma cell infiltrate.  ?Immunohistochemistry (IHC) for classification was performed, with following result:  ?CAIX (Carbonic Anhydrase 9): Positive, strong membranous staining in over 90% of tumor cells  ?CK7 (Cytokeratin 7): Negative  ?The IHC profile confirms the above diagnosis.  ? ?CANCER CASE SUMMARY: KIDNEY  ?Standard(s): AJCC-UICC 8  ? ?SPECIMEN  ?Procedure: Radical nephrectomy  ?Specimen Laterality: Right  ? ?TUMOR  ?Tumor Focality: Unifocal  ?Tumor Size: Greatest dimension 9.3 cm  ?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  ?Histologic Grade (WHO / ISUP Grade): G3, nucleoli conspicuous and  ?eosinophilic at 347 X magnification  ?Tumor Extent: Extends into major vein (segmental branch adjacent to  ?renal sinus)  ?Sarcomatoid Features: Not identified  ?Rhabdoid Features: Not identified  ?Tumor Necrosis: Present, microscopic and macroscopic, 20%  ? ?MARGINS  ?Margin Status: All margins negative for invasive carcinoma  ? ?REGIONAL LYMPH NODES  ?Regional Lymph Node Status: Not applicable (no regional lymph nodes submitted or found)  ? ?DISTANT METASTASIS  ?Distant Site(s) Involved, if applicable: Not applicable  ? ?PATHOLOGIC STAGE CLASSIFICATION (pTNM, AJCC 8th Edition):  ?TNM Descriptors: Applicable  ?pT3a  ?pN - Not assigned (no lymph nodes submitted or found)  ?pM - Not applicable  ? ?ADDITIONAL FINDINGS  ?Additional Pathologic Findings in Non-neoplastic Kidney: 20% globally  sclerotic glomeruli,  ?Moderate arterial intimal fibrosis  ? ?Her case was presented at tumor board with consensus for adjuvant [place].  ? ?# FEB 2023- Right lower lobe subpleural 4 mm lung nodule; bone survey negative ? ? # CKD III [ sec to HTN > 20 years; none now]; Anemia- EGD/colo- 2023- [[facility]]; Dr.Kowalski [No CAD] ? ?HISTORY OF PRESENTING ILLNESS: Ambulating independently.  Accompanied by husband.  ? ?Gabrielle Savage 76 y.o.  female with history of chronic kidney disease stage III; right kidney mass, now s/p nephrectomy with Dr. Diamantina Providence, pathology consistent with clear cell carcinoma, grade 3, extending into major vein, with plans for adjuvant Beryle Flock, who returns to clinic for follow up and for consideration of initiation of adjuvant treatment. She continues to feel well. Is nervous about starting treatment. Attended chemo class which she says was helpful.  Her energy has improved since surgery and she feels well.  ? ?Review of Systems  ?Constitutional:  Negative for chills, fever, malaise/fatigue and weight loss.  ?HENT:  Negative for hearing loss, nosebleeds, sore throat and tinnitus.   ?Eyes:  Negative for blurred vision and double vision.  ?Respiratory:  Negative for cough, hemoptysis, shortness of breath and wheezing.   ?Cardiovascular:  Negative for chest pain, palpitations and leg swelling.  ?Gastrointestinal:  Negative for abdominal pain, blood in stool, constipation, diarrhea, melena, nausea and vomiting.  ?Genitourinary:  Negative for dysuria and urgency.  ?Musculoskeletal:  Negative for back pain, falls, joint pain and myalgias.  ?Skin:  Negative for itching and rash.  ?Neurological:  Negative for dizziness, tingling, sensory change, loss of consciousness, weakness and headaches.  ?Endo/Heme/Allergies:  Negative for environmental allergies. Does not bruise/bleed easily.  ?Psychiatric/Behavioral:  Negative for depression. The patient is nervous/anxious. The patient does not have insomnia.     ? ?MEDICAL HISTORY:  ?Past Medical History:  ?Diagnosis Date  ? Aortic atherosclerosis ([place])   ? Arthritis   ? B12 deficiency   ? Bradycardia   ? CAD (coronary artery disease)   ? Carotid atherosclerosis   ? CKD (chronic kidney disease), stage III ([place])   ? Diastolic dysfunction 28/76/8115  ? a.)  TTE 09/03/2021: EF 63%; no RWMAs; LA mildly enlarged; trivial TR/PR, mild AR, moderate MR; G1DD.  ? DOE (dyspnea on exertion)   ? Fatigue   ? Gout   ? HLD (hyperlipidemia)   ? Hypertension   ? IDA (iron deficiency anemia)   ? Iliotibial band syndrome, left leg   ? MDD (major depressive disorder)   ? OAB (overactive bladder)   ? Obesity   ? PAC (premature atrial contraction)   ? noted on Holter  ? PONV (postoperative nausea and vomiting)   ? a.) single episode in 03/2013  ? PVC (premature ventricular contraction)   ? noted on Holter  ? Right kidney mass 10/27/2021  ? a.) CT 10/27/2021 -- mass measuring 8.3 x 6.8 x 8.1 cm  ? Sensorineural hearing loss (SNHL) of both ears   ? SUI (stress urinary incontinence, female)   ? SVT (supraventricular tachycardia) ([place])   ? noted on Holter  ? T2DM (type 2 diabetes mellitus) ([place])   ? Uterovaginal prolapse   ? Vitamin D deficiency   ? Voiding dysfunction   ? ? ?SURGICAL HISTORY: ?Past Surgical History:  ?Procedure Laterality Date  ? ABDOMINAL HYSTERECTOMY    ? partial  ? BLADDER SUSPENSION    ? CHOLECYSTECTOMY    ? COLONOSCOPY N/A 07/25/2017  ? Procedure: COLONOSCOPY;  Surgeon: [name], MD;  [facility] END[facility]COPY;  Service: Endoscopy;  Laterality: N/A;  ? COLONOSCOPY WITH PROPOFOL N/A 10/19/2021  ? Procedure: COLONOSCOPY WITH PROPOFOL;  Surgeon: [name], MD;  Location: [facility] ENDOSCOPY;  Service: Endoscopy;  Laterality: N/A;  DM  ? ESOPHAGOGASTRODUODENOSCOPY (EGD) WITH PROPOFOL N/A 10/19/2021  ? Procedure: ESOPHAGOGASTRODUODENOSCOPY (EGD) WITH PROPOFOL;  Surgeon: [name], MD;  Location: [facility] ENDOSCOPY;  Service: Endoscopy;  Laterality: N/A;  ? JOINT  REPLACEMENT Bilateral   ? PARTIAL KNEE REPLACEMENT  ? KNEE SURGERY    ? LAPAROSCOPIC NEPHRECTOMY, HAND ASSISTED Right 11/20/2021  ? Procedure: HAND ASSISTED LAPAROSCOPIC RADICAL NEPHRECTOMY;  Surgeon: Billey Co, MD;  Location: [facility] ORS;  Service: Urology;  Laterality: Right;  ? PARATHYROIDECTOMY    ? ? ?SOCIAL HISTORY: ?Social History  ? ?Socioeconomic History  ? Marital status: Married  ?  Spouse name: Not on file  ? Number of children: Not on file  ? Years of education: Not on file  ? Highest education level: Not on file  ?Occupational History  ? Not on file  ?Tobacco Use  ? Smoking status: Never  ?  Passive exposure: Never  ? Smokeless tobacco: Never  ?Vaping Use  ? Vaping Use: Never used  ?Substance and Sexual Activity  ? Alcohol use: Not Currently  ?  Comment: occ  ? Drug use: No  ? Sexual activity: Not Currently  ?Other Topics Concern  ? Not on file  ?Social History Narrative  ? Lives in [place] - close hospital; with husband; 3 biological children [[place]; [place]; [place]]. Never smoked; glass of wine rare. 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   ? ?Social Determinants of Health  ? ?Financial Resource Strain:  Low Risk   ? Difficulty of Paying Living Expenses: Not hard at all  ?Food Insecurity: No Food Insecurity  ? Worried About Charity fundraiser in the Last Year: Never true  ? Ran Out of Food in the Last Year: Never true  ?Transportation Needs: No Transportation Needs  ? Lack of Transportation (Medical): No  ? Lack of Transportation (Non-Medical): No  ?Physical Activity: Insufficiently Active  ? Days of Exercise per Week: 2 days  ? Minutes of Exercise per Session: 30 min  ?Stress: No Stress Concern Present  ? Feeling of Stress : Only a little  ?Social Connections: Moderately Integrated  ? Frequency of Communication with Friends and Family: Three times a week  ? Frequency of Social Gatherings with Friends and Family: Three times a week  ? Attends Religious Services: 1 to 4 times per year  ?  Active Member of Clubs or Organizations: No  ? Attends Archivist Meetings: Never  ? Marital Status: Married  ?Intimate Partner Violence: Not At Risk  ? Fear of Current or Ex-Partner: No  ? [place]

## 2021-12-18 NOTE — Progress Notes (Signed)
Creatine 1.81 Per Beckey Rutter NP okay to proceed with [name].  ? ?

## 2021-12-18 NOTE — Patient Instructions (Addendum)
[facility]  Discharge Instructions: °Thank you for choosing [facility] to provide your oncology and hematology care.  °If you have a lab appointment with the Cancer Center, please go directly to the Cancer Center and check in at the registration area. ° °Wear comfortable clothing and clothing appropriate for easy access to any Portacath or PICC line.  ° °We strive to give you quality time with your provider. You may need to reschedule your appointment if you arrive late (15 or more minutes).  Arriving late affects you and other patients whose appointments are after yours.  Also, if you miss three or more appointments without notifying the office, you may be dismissed from the clinic at the provider’s discretion.    °  °For prescription refill requests, have your pharmacy contact our office and allow 72 hours for refills to be completed.   ° °Today you received the following chemotherapy and/or immunotherapy agents: [name]    °  °To help prevent nausea and vomiting after your treatment, we encourage you to take your nausea medication as directed. ° °BELOW ARE SYMPTOMS THAT SHOULD BE REPORTED IMMEDIATELY: °*FEVER GREATER THAN 100.4 F (38 °C) OR HIGHER °*CHILLS OR SWEATING °*NAUSEA AND VOMITING THAT IS NOT CONTROLLED WITH YOUR NAUSEA MEDICATION °*UNUSUAL SHORTNESS OF BREATH °*UNUSUAL BRUISING OR BLEEDING °*URINARY PROBLEMS (pain or burning when urinating, or frequent urination) °*BOWEL PROBLEMS (unusual diarrhea, constipation, pain near the anus) °TENDERNESS IN MOUTH AND THROAT WITH OR WITHOUT PRESENCE OF ULCERS (sore throat, sores in mouth, or a toothache) °UNUSUAL RASH, SWELLING OR PAIN  °UNUSUAL VAGINAL DISCHARGE OR ITCHING  ° °Items with * indicate a potential emergency and should be followed up as soon as possible or go to the Emergency Department if any problems should occur. ° °Please show the CHEMOTHERAPY ALERT CARD or IMMUNOTHERAPY ALERT CARD at check-in to  the Emergency Department and triage nurse. ° °Should you have questions after your visit or need to cancel or reschedule your appointment, please contact [facility]  [phone] and follow the prompts.  Office hours are 8:00 a.m. to 4:30 p.m. Monday - Friday. Please note that voicemails left after 4:00 p.m. may not be returned until the following business day.  We are closed weekends and major holidays. You have access to a nurse at all times for urgent questions. Please call the main number to the clinic [phone] and follow the prompts. ° °For any non-urgent questions, you may also contact your provider using MyChart. We now offer e-Visits for anyone 18 and older to request care online for non-urgent symptoms. For details visit [email or web address]. °  °Also download the MyChart app! Go to the app store, search "MyChart", open the app, select [place], and log in with your MyChart username and password. ° °Due to Covid, a mask is required upon entering the hospital/clinic. If you do not have a mask, one will be given to you upon arrival. For doctor visits, patients may have 1 support person aged 18 or older with them. For treatment visits, patients cannot have anyone with them due to current Covid guidelines and our immunocompromised population. Pembrolizumab injection °What is this medication? °PEMBROLIZUMAB (pem broe liz ue mab) is a monoclonal antibody. It is used to treat certain types of cancer. °This medicine may be used for other purposes; ask your health care provider or pharmacist if you have questions. °COMMON BRAND NAME(S): [name] °What should I tell my care team before I   take this medication? °They need to know if you have any of these conditions: °autoimmune diseases like Crohn's disease, ulcerative colitis, or lupus °have had or planning to have an allogeneic stem cell transplant (uses someone else's stem cells) °history of organ transplant °history of  chest radiation °nervous system problems like myasthenia gravis or Guillain-Barre syndrome °an unusual or allergic reaction to pembrolizumab, other medicines, foods, dyes, or preservatives °pregnant or trying to get pregnant °breast-feeding °How should I use this medication? °This medicine is for infusion into a vein. It is given by a health care professional in a hospital or clinic setting. °A special MedGuide will be given to you before each treatment. Be sure to read this information carefully each time. °Talk to your pediatrician regarding the use of this medicine in children. While this drug may be prescribed for children as young as 6 months for selected conditions, precautions do apply. °Overdosage: If you think you have taken too much of this medicine contact a poison control center or emergency room at once. °NOTE: This medicine is only for you. Do not share this medicine with others. °What if I miss a dose? °It is important not to miss your dose. Call your doctor or health care professional if you are unable to keep an appointment. °What may interact with this medication? °Interactions have not been studied. °This list may not describe all possible interactions. Give your health care provider a list of all the medicines, herbs, non-prescription drugs, or dietary supplements you use. Also tell them if you smoke, drink alcohol, or use illegal drugs. Some items may interact with your medicine. °What should I watch for while using this medication? °Your condition will be monitored carefully while you are receiving this medicine. °You may need blood work done while you are taking this medicine. °Do not become pregnant while taking this medicine or for 4 months after stopping it. Women should inform their doctor if they wish to become pregnant or think they might be pregnant. There is a potential for serious side effects to an unborn child. Talk to your health care professional or pharmacist for more  information. Do not breast-feed an infant while taking this medicine or for 4 months after the last dose. °What side effects may I notice from receiving this medication? °Side effects that you should report to your doctor or health care professional as soon as possible: °allergic reactions like skin rash, itching or hives, swelling of the face, lips, or tongue °bloody or black, tarry °breathing problems °changes in vision °chest pain °chills °confusion °constipation °cough °diarrhea °dizziness or feeling faint or lightheaded °fast or irregular heartbeat °fever °flushing °joint pain °low blood counts - this medicine may decrease the number of white blood cells, red blood cells and platelets. You may be at increased risk for infections and bleeding. °muscle pain °muscle weakness °pain, tingling, numbness in the hands or feet °persistent headache °redness, blistering, peeling or loosening of the skin, including inside the mouth °signs and symptoms of high blood sugar such as dizziness; dry mouth; dry skin; fruity breath; nausea; stomach pain; increased hunger or thirst; increased urination °signs and symptoms of kidney injury like trouble passing urine or change in the amount of urine °signs and symptoms of liver injury like dark urine, light-colored stools, loss of appetite, nausea, right upper belly pain, yellowing of the eyes or skin °sweating °swollen lymph nodes °weight loss °Side effects that usually do not require medical attention (report to your doctor or health care   professional if they continue or are bothersome): °decreased appetite °hair loss °tiredness °This list may not describe all possible side effects. Call your doctor for medical advice about side effects. You may report side effects to FDA at 1-800-FDA-1088. °Where should I keep my medication? °This drug is given in a hospital or clinic and will not be stored at home. °NOTE: This sheet is a summary. It may not cover all possible information. If you  have questions about this medicine, talk to your doctor, pharmacist, or health care provider. °© 2022 Elsevier/Gold Standard (2021-05-19 00:00:00) ° °

## 2021-12-21 ENCOUNTER — Telehealth: Payer: Self-pay

## 2021-12-21 NOTE — Telephone Encounter (Signed)
Telephone call to patient for follow up after receiving first infusion.   Patient states infusion went great.  States eating good and drinking plenty of fluids.   Denies any nausea or vomiting.  Encouraged patient to call for any concerns or questions. 

## 2021-12-23 ENCOUNTER — Other Ambulatory Visit: Payer: Self-pay | Admitting: Emergency Medicine

## 2021-12-23 DIAGNOSIS — C641 Malignant neoplasm of right kidney, except renal pelvis: Secondary | ICD-10-CM

## 2021-12-24 ENCOUNTER — Encounter: Payer: Self-pay | Admitting: Medical Oncology

## 2021-12-24 ENCOUNTER — Inpatient Hospital Stay: Payer: Medicare Other

## 2021-12-24 ENCOUNTER — Inpatient Hospital Stay (HOSPITAL_BASED_OUTPATIENT_CLINIC_OR_DEPARTMENT_OTHER): Payer: Medicare Other | Admitting: Medical Oncology

## 2021-12-24 VITALS — BP 162/64 | HR 61 | Temp 96.8°F | Resp 18 | Wt 175.1 lb

## 2021-12-24 DIAGNOSIS — C641 Malignant neoplasm of right kidney, except renal pelvis: Secondary | ICD-10-CM

## 2021-12-24 DIAGNOSIS — Z5112 Encounter for antineoplastic immunotherapy: Secondary | ICD-10-CM | POA: Diagnosis not present

## 2021-12-24 LAB — COMPREHENSIVE METABOLIC PANEL
ALT: 9 U/L (ref 0–44)
AST: 12 U/L — ABNORMAL LOW (ref 15–41)
Albumin: 3.6 g/dL (ref 3.5–5.0)
Alkaline Phosphatase: 111 U/L (ref 38–126)
Anion gap: 6 (ref 5–15)
BUN: 39 mg/dL — ABNORMAL HIGH (ref 8–23)
CO2: 20 mmol/L — ABNORMAL LOW (ref 22–32)
Calcium: 9.6 mg/dL (ref 8.9–10.3)
Chloride: 112 mmol/L — ABNORMAL HIGH (ref 98–111)
Creatinine, Ser: 1.63 mg/dL — ABNORMAL HIGH (ref 0.44–1.00)
GFR, Estimated: 33 mL/min — ABNORMAL LOW (ref >60)
Glucose, Bld: 136 mg/dL — ABNORMAL HIGH (ref 70–99)
Potassium: 4.3 mmol/L (ref 3.5–5.1)
Sodium: 138 mmol/L (ref 135–145)
Total Bilirubin: 0.3 mg/dL (ref 0.3–1.2)
Total Protein: 7.1 g/dL (ref 6.5–8.1)

## 2021-12-24 LAB — CBC WITH DIFFERENTIAL/PLATELET
Abs Immature Granulocytes: 0.02 10*3/uL (ref 0.00–0.07)
Basophils Absolute: 0 10*3/uL (ref 0.0–0.1)
Basophils Relative: 0 %
Eosinophils Absolute: 0.6 10*3/uL — ABNORMAL HIGH (ref 0.0–0.5)
Eosinophils Relative: 11 %
HCT: 30.1 % — ABNORMAL LOW (ref 36.0–46.0)
Hemoglobin: 9.2 g/dL — ABNORMAL LOW (ref 12.0–15.0)
Immature Granulocytes: 0 %
Lymphocytes Relative: 24 %
Lymphs Abs: 1.4 10*3/uL (ref 0.7–4.0)
MCH: 27.4 pg (ref 26.0–34.0)
MCHC: 30.6 g/dL (ref 30.0–36.0)
MCV: 89.6 fL (ref 80.0–100.0)
Monocytes Absolute: 0.5 10*3/uL (ref 0.1–1.0)
Monocytes Relative: 9 %
Neutro Abs: 3.1 10*3/uL (ref 1.7–7.7)
Neutrophils Relative %: 56 %
Platelets: 179 10*3/uL (ref 150–400)
RBC: 3.36 MIL/uL — ABNORMAL LOW (ref 3.87–5.11)
RDW: 19.1 % — ABNORMAL HIGH (ref 11.5–15.5)
WBC: 5.6 10*3/uL (ref 4.0–10.5)
nRBC: 0 % (ref 0.0–0.2)

## 2021-12-24 NOTE — Progress Notes (Signed)
No IV fluids today, per Nelwyn Salisbury, PA.  ?

## 2021-12-24 NOTE — Progress Notes (Signed)
Here for Follow up; felt great until yesterday. Had a lot of fatigue. This morning she woke up with bilateral knee pain. Otherwise thinks she handled treatment fine. ?

## 2021-12-24 NOTE — Progress Notes (Signed)
[facility] ?Office Visit Note ? ?Patient Care Team: ?Tracie Harrier, MD as PCP - General (Internal Medicine) ? ?CHIEF COMPLAINTS/PURPOSE OF CONSULTATION: Kidney mass  ? ?# FEB 14th, 2023- CT AP [GI; [facility]]- . Large mass exophytic extending from lower to mid pole of the RIGHT kidney consistent with renal neoplasm. [Dr.Sninski; Uorlogy] ? ?DIAGNOSIS:  ?A. KIDNEY AND PROXIMAL URETER, RIGHT; RADICAL NEPHRECTOMY:  ?- CLEAR CELL RENAL CELL CARCINOMA.  ?Comment:  ?The tumor shows variability in morphology and a very prominent plasma cell infiltrate.  ?Immunohistochemistry (IHC) for classification was performed, with following result:  ?CAIX (Carbonic Anhydrase 9): Positive, strong membranous staining in over 90% of tumor cells  ?CK7 (Cytokeratin 7): Negative  ?The IHC profile confirms the above diagnosis.  ? ?CANCER CASE SUMMARY: KIDNEY  ?Standard(s): AJCC-UICC 8  ? ?SPECIMEN  ?Procedure: Radical nephrectomy  ?Specimen Laterality: Right  ? ?TUMOR  ?Tumor Focality: Unifocal  ?Tumor Size: Greatest dimension 9.3 cm  ?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  ?Histologic Grade (WHO / ISUP Grade): G3, nucleoli conspicuous and  ?eosinophilic at 818 X magnification  ?Tumor Extent: Extends into major vein (segmental branch adjacent to  ?renal sinus)  ?Sarcomatoid Features: Not identified  ?Rhabdoid Features: Not identified  ?Tumor Necrosis: Present, microscopic and macroscopic, 20%  ? ?MARGINS  ?Margin Status: All margins negative for invasive carcinoma  ? ?REGIONAL LYMPH NODES  ?Regional Lymph Node Status: Not applicable (no regional lymph nodes submitted or found)  ? ?DISTANT METASTASIS  ?Distant Site(s) Involved, if applicable: Not applicable  ? ?PATHOLOGIC STAGE CLASSIFICATION (pTNM, AJCC 8th Edition):  ?TNM Descriptors: Applicable  ?pT3a  ?pN - Not assigned (no lymph nodes submitted or found)  ?pM - Not applicable  ? ?ADDITIONAL FINDINGS  ?Additional Pathologic Findings in Non-neoplastic Kidney: 20%  globally sclerotic glomeruli,  ?Moderate arterial intimal fibrosis  ? ?Her case was presented at tumor board with consensus for adjuvant [place].  ? ?# FEB 2023- Right lower lobe subpleural 4 mm lung nodule; bone survey negative ? ? # CKD III [ sec to HTN > 20 years; none now]; Anemia- EGD/colo- 2023- [[facility]]; Dr.Kowalski [No CAD] ? ?HISTORY OF PRESENTING ILLNESS: Ambulating independently.  Accompanied by husband.  ? ?Gabrielle Savage 76 y.o.  female with history of chronic kidney disease stage III; right kidney mass, now s/p nephrectomy with Dr. Diamantina Providence, pathology consistent with clear cell carcinoma, grade 3, extending into major vein, with plans for adjuvant Beryle Flock, who returns to clinic for follow up from her first treatment to assess tolerance.  ? ?She states that she is feeling well. The only reports side effect is mild fatigue which has resolved and bilateral acute on chronic knee pain. Has taken tylenol. No fevers, rash, N/V/D/C. Eating well and did have her visit with nutrition. Excited to proceed forward with treatment.  ? ?Review of Systems  ?Constitutional:  Negative for chills, fever, malaise/fatigue and weight loss.  ?HENT:  Negative for hearing loss, nosebleeds, sore throat and tinnitus.   ?Eyes:  Negative for blurred vision and double vision.  ?Respiratory:  Negative for cough, hemoptysis, shortness of breath and wheezing.   ?Cardiovascular:  Negative for chest pain, palpitations and leg swelling.  ?Gastrointestinal:  Negative for abdominal pain, blood in stool, constipation, diarrhea, melena, nausea and vomiting.  ?Genitourinary:  Negative for dysuria and urgency.  ?Musculoskeletal:  Positive for joint pain. Negative for back pain, falls and myalgias.  ?Skin:  Negative for itching and rash.  ?Neurological:  Negative for dizziness, tingling,  sensory change, loss of consciousness, weakness and headaches.  ?Endo/Heme/Allergies:  Negative for environmental allergies. Does not bruise/bleed easily.   ?Psychiatric/Behavioral:  Negative for depression. The patient is not nervous/anxious and does not have insomnia.    ? ?MEDICAL HISTORY:  ?Past Medical History:  ?Diagnosis Date  ? Aortic atherosclerosis ([place])   ? Arthritis   ? B12 deficiency   ? Bradycardia   ? CAD (coronary artery disease)   ? Carotid atherosclerosis   ? CKD (chronic kidney disease), stage III ([place])   ? Diastolic dysfunction 50/05/3817  ? a.)  TTE 09/03/2021: EF 63%; no RWMAs; LA mildly enlarged; trivial TR/PR, mild AR, moderate MR; G1DD.  ? DOE (dyspnea on exertion)   ? Fatigue   ? Gout   ? HLD (hyperlipidemia)   ? Hypertension   ? IDA (iron deficiency anemia)   ? Iliotibial band syndrome, left leg   ? MDD (major depressive disorder)   ? OAB (overactive bladder)   ? Obesity   ? PAC (premature atrial contraction)   ? noted on Holter  ? PONV (postoperative nausea and vomiting)   ? a.) single episode in 03/2013  ? PVC (premature ventricular contraction)   ? noted on Holter  ? Right kidney mass 10/27/2021  ? a.) CT 10/27/2021 -- mass measuring 8.3 x 6.8 x 8.1 cm  ? Sensorineural hearing loss (SNHL) of both ears   ? SUI (stress urinary incontinence, female)   ? SVT (supraventricular tachycardia) ([place])   ? noted on Holter  ? T2DM (type 2 diabetes mellitus) ([place])   ? Uterovaginal prolapse   ? Vitamin D deficiency   ? Voiding dysfunction   ? ? ?SURGICAL HISTORY: ?Past Surgical History:  ?Procedure Laterality Date  ? ABDOMINAL HYSTERECTOMY    ? partial  ? BLADDER SUSPENSION    ? CHOLECYSTECTOMY    ? COLONOSCOPY N/A 07/25/2017  ? Procedure: COLONOSCOPY;  Surgeon: [name], MD;  Location[facility] ENDOSCOPY;  Service: Endoscopy;  Laterality: N/A;  ? COLONOSCOPY WITH PROPOFOL N/A 10/19/2021  ? Procedure: COLONOSCOPY WITH PROPOFOL;  Surgeon: [name], MD;  Location: [facility] ENDOSCOPY;  Service: Endoscopy;  Laterality: N/A;  DM  ? ESOPHAGOGASTRODUODENOSCOPY (EGD) WITH PROPOFOL N/A 10/19/2021  ? Procedure: ESOPHAGOGASTRODUODENOSCOPY (EGD) WITH  PROPOFOL;  Surgeon: [name], MD;  Location: [facility] ENDOSCOPY;  Service: Endoscopy;  Laterality: N/A;  ? JOINT REPLACEMENT Bilateral   ? PARTIAL KNEE REPLACEMENT  ? KNEE SURGERY    ? LAPAROSCOPIC NEPHRECTOMY, HAND ASSISTED Right 11/20/2021  ? Procedure: HAND ASSISTED LAPAROSCOPIC RADICAL NEPHRECTOMY;  Surgeon: Billey Co, MD;  Location: [facility] ORS;  Service: Urology;  Laterality: Right;  ? PARATHYROIDECTOMY    ? ? ?SOCIAL HISTORY: ?Social History  ? ?Socioeconomic History  ? Marital status: Married  ?  Spouse name: Not on file  ? Number of children: Not on file  ? Years of education: Not on file  ? Highest education level: Not on file  ?Occupational History  ? Not on file  ?Tobacco Use  ? Smoking status: Never  ?  Passive exposure: Never  ? Smokeless tobacco: Never  ?Vaping Use  ? Vaping Use: Never used  ?Substance and Sexual Activity  ? Alcohol use: Not Currently  ?  Comment: occ  ? Drug use: No  ? Sexual activity: Not Currently  ?Other Topics Concern  ? Not on file  ?Social History Narrative  ? Lives in [place] - close hospital; with husband; 3 biological children [[place]; [place]; [place]]. Never  smoked; glass of wine rare. 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   ? ?Social Determinants of Health  ? ?Financial Resource Strain: Low Risk   ? Difficulty of Paying Living Expenses: Not hard at all  ?Food Insecurity: No Food Insecurity  ? Worried About Charity fundraiser in the Last Year: Never true  ? Ran Out of Food in the Last Year: Never true  ?Transportation Needs: No Transportation Needs  ? Lack of Transportation (Medical): No  ? Lack of Transportation (Non-Medical): No  ?Physical Activity: Insufficiently Active  ? Days of Exercise per Week: 2 days  ? Minutes of Exercise per Session: 30 min  ?Stress: No Stress Concern Present  ? Feeling of Stress : Only a little  ?Social Connections: Moderately Integrated  ? Frequency of Communication with Friends and Family: Three times a week  ? Frequency of  Social Gatherings with Friends and Family: Three times a week  ? Attends Religious Services: 1 to 4 times per year  ? Active Member of Clubs or Organizations: No  ? Attends Archivist Meetings: Never

## 2021-12-25 ENCOUNTER — Inpatient Hospital Stay (HOSPITAL_BASED_OUTPATIENT_CLINIC_OR_DEPARTMENT_OTHER): Payer: Medicare Other | Admitting: Medical Oncology

## 2021-12-25 ENCOUNTER — Telehealth: Payer: Self-pay | Admitting: *Deleted

## 2021-12-25 DIAGNOSIS — M25562 Pain in left knee: Secondary | ICD-10-CM | POA: Diagnosis not present

## 2021-12-25 DIAGNOSIS — M25561 Pain in right knee: Secondary | ICD-10-CM | POA: Diagnosis not present

## 2021-12-25 DIAGNOSIS — Z5112 Encounter for antineoplastic immunotherapy: Secondary | ICD-10-CM | POA: Diagnosis not present

## 2021-12-25 MED ORDER — PREDNISONE 10 MG PO TABS: ORAL_TABLET | ORAL | 0 refills | Status: DC

## 2021-12-25 MED ORDER — HYDROCODONE-ACETAMINOPHEN 5-325 MG PO TABS: 1.0000 | ORAL_TABLET | Freq: Four times a day (QID) | ORAL | 0 refills | Status: AC | PRN

## 2021-12-25 NOTE — Telephone Encounter (Signed)
Pt states that she can hardly walk and would prefer to do a virtual visit. Appointment scheduled.  ?

## 2021-12-25 NOTE — Telephone Encounter (Signed)
Pharmacy called to clarify directions of Prednisone. I told them that it should say 2 tabs daily not 20 tabs  on the second week ?

## 2021-12-25 NOTE — Progress Notes (Signed)
? ?Virtual Visit Progress Note ? ?Gabrielle Savage,you are scheduled for a virtual visit with your provider today.   ? ?Just as we do with appointments in the office, we must obtain your consent to participate.  Your consent will be active for this visit and any virtual visit you may have with one of our providers in the next 365 days.   ? ?If you have a MyChart account, I can also send a copy of this consent to you electronically.  All virtual visits are billed to your insurance company just like a traditional visit in the office.  As this is a virtual visit, video technology does not allow for your provider to perform a traditional examination.  This may limit your provider's ability to fully assess your condition.  If your provider identifies any concerns that need to be evaluated in person or the need to arrange testing such as labs, EKG, etc, we will make arrangements to do so.   ? ?Although advances in technology are sophisticated, we cannot ensure that it will always work on either your end or our end.  If the connection with a video visit is poor, we may have to switch to a telephone visit.  With either a video or telephone visit, we are not always able to ensure that we have a secure connection.   I need to obtain your verbal consent now.   Are you willing to proceed with your visit today?  ? ?Gabrielle Savage has provided verbal consent on 12/25/2021 for a virtual visit (video or telephone). ? ? ?Hughie Closs, PA-C ?12/25/2021  2:21 PM ?  ? ?I connected with Gabrielle Savage on 12/25/21 at  2:00 PM EDT by video enabled telemedicine visit and verified that I am speaking with the correct person using two identifiers.  ? ?I discussed the limitations, risks, security and privacy concerns of performing an evaluation and management service by telemedicine and the availability of in-person appointments. I also discussed with the patient that there may be a patient responsible charge related to this service. The  patient expressed understanding and agreed to proceed.  ? ?Other persons participating in the visit and their role in the encounter: None  ? ?Patient?s location: Home  ?Provider?s location: Clinic  ? ?Chief Complaint: Bilateral knee pain following first [name] treatment  ?   ?Patient Care Team: ?Tracie Harrier, MD as PCP - General (Internal Medicine)  ? ?Name of the patient: Gabrielle Savage  ?[id number]  ?May 29, 1946  ? ?Date of visit: 12/25/21 ? ?History of Presenting Illness ? ?Bilateral Knee Pain: For the past 1-2 days has had bilateral knee pain. No known injury and thought to be secondary from her new [name] course. She reports that at rest she has no pain. Only occurs with ROM of knees or when walking. Pain is then severe and debilitating. No calf pain, fevers, SOB, increased warmth of knees or peripheral edema. She has had two partial replacements on these knees. Has history of gout but symptoms do not feel similar. She has tried tylenol without improvement. No other side effects.  ? ?Review of systems- ROS  ? ?Allergies  ?Allergen Reactions  ? Codeine Nausea Only  ? Nsaids Other (See Comments)  ?  Bad kidneys  ? Oxycodone Nausea Only  ? ? ?Past Medical History:  ?Diagnosis Date  ? Aortic atherosclerosis ([place])   ? Arthritis   ? B12 deficiency   ? Bradycardia   ? CAD (coronary artery disease)   ?  Carotid atherosclerosis   ? CKD (chronic kidney disease), stage III ([place])   ? Diastolic dysfunction 58/30/9407  ? a.)  TTE 09/03/2021: EF 63%; no RWMAs; LA mildly enlarged; trivial TR/PR, mild AR, moderate MR; G1DD.  ? DOE (dyspnea on exertion)   ? Fatigue   ? Gout   ? HLD (hyperlipidemia)   ? Hypertension   ? IDA (iron deficiency anemia)   ? Iliotibial band syndrome, left leg   ? MDD (major depressive disorder)   ? OAB (overactive bladder)   ? Obesity   ? PAC (premature atrial contraction)   ? noted on Holter  ? PONV (postoperative nausea and vomiting)   ? a.) single episode in 03/2013  ? PVC (premature ventricular  contraction)   ? noted on Holter  ? Right kidney mass 10/27/2021  ? a.) CT 10/27/2021 -- mass measuring 8.3 x 6.8 x 8.1 cm  ? Sensorineural hearing loss (SNHL) of both ears   ? SUI (stress urinary incontinence, female)   ? SVT (supraventricular tachycardia) ([place])   ? noted on Holter  ? T2DM (type 2 diabetes mellitus) ([place])   ? Uterovaginal prolapse   ? Vitamin D deficiency   ? Voiding dysfunction   ? ? ?Past Surgical History:  ?Procedure Laterality Date  ? ABDOMINAL HYSTERECTOMY    ? partial  ? BLADDER SUSPENSION    ? CHOLECYSTECTOMY    ? COLONOSCOPY N/A 07/25/2017  ? Procedure: COLONOSCOPY;  Surgeon: [name], MD;  Location[facility] ENDOSCOPY;  Service: Endoscopy;  Laterality: N/A;  ? COLONOSCOPY WITH PROPOFOL N/A 10/19/2021  ? Procedure: COLONOSCOPY WITH PROPOFOL;  Surgeon: [name], MD;  Location: [facility] ENDOSCOPY;  Service: Endoscopy;  Laterality: N/A;  DM  ? ESOPHAGOGASTRODUODENOSCOPY (EGD) WITH PROPOFOL N/A 10/19/2021  ? Procedure: ESOPHAGOGASTRODUODENOSCOPY (EGD) WITH PROPOFOL;  Surgeon: [name], MD;  Location: [facility] ENDOSCOPY;  Service: Endoscopy;  Laterality: N/A;  ? JOINT REPLACEMENT Bilateral   ? PARTIAL KNEE REPLACEMENT  ? KNEE SURGERY    ? LAPAROSCOPIC NEPHRECTOMY, HAND ASSISTED Right 11/20/2021  ? Procedure: HAND ASSISTED LAPAROSCOPIC RADICAL NEPHRECTOMY;  Surgeon: Billey Co, MD;  Location: [facility] ORS;  Service: Urology;  Laterality: Right;  ? PARATHYROIDECTOMY    ? ? ?Social History  ? ?Socioeconomic History  ? Marital status: Married  ?  Spouse name: Not on file  ? Number of children: Not on file  ? Years of education: Not on file  ? Highest education level: Not on file  ?Occupational History  ? Not on file  ?Tobacco Use  ? Smoking status: Never  ?  Passive exposure: Never  ? Smokeless tobacco: Never  ?Vaping Use  ? Vaping Use: Never used  ?Substance and Sexual Activity  ? Alcohol use: Not Currently  ?  Comment: occ  ? Drug use: No  ? Sexual activity: Not Currently   ?Other Topics Concern  ? Not on file  ?Social History Narrative  ? Lives in [place] - close hospital; with husband; 3 biological children [[place]; [place]; [place]]. Never smoked; glass of wine rare. 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   ? ?Social Determinants of Health  ? ?Financial Resource Strain: Low Risk   ? Difficulty of Paying Living Expenses: Not hard at all  ?Food Insecurity: No Food Insecurity  ? Worried About Charity fundraiser in the Last Year: Never true  ? Ran Out of Food in the Last Year: Never true  ?Transportation Needs: No Transportation Needs  ? Lack of Transportation (Medical):  No  ? Lack of Transportation (Non-Medical): No  ?Physical Activity: Insufficiently Active  ? Days of Exercise per Week: 2 days  ? Minutes of Exercise per Session: 30 min  ?Stress: No Stress Concern Present  ? Feeling of Stress : Only a little  ?Social Connections: Moderately Integrated  ? Frequency of Communication with Friends and Family: Three times a week  ? Frequency of Social Gatherings with Friends and Family: Three times a week  ? Attends Religious Services: 1 to 4 times per year  ? Active Member of Clubs or Organizations: No  ? Attends Archivist Meetings: Never  ? Marital Status: Married  ?Intimate Partner Violence: Not At Risk  ? Fear of Current or Ex-Partner: No  ? Emotionally Abused: No  ? Physically Abused: No  ? Sexually Abused: No  ? ? ?Immunization History  ?Administered Date(s) Administered  ? Influenza Inj Mdck Quad Pf 07/03/2019, 07/21/2021  ? Influenza Split 07/05/2014, 08/22/2015  ? Influenza, High Dose Seasonal PF 06/23/2020  ? Influenza,inj,Quad PF,6+ Mos 05/10/2018  ? Influenza-Unspecified 06/01/2013, 06/07/2016, 06/20/2017  ? PFIZER Comirnaty(Gray Top)Covid-19 Tri-Sucrose Vaccine 11/20/2019, 06/16/2020  ? PFIZER(Purple Top)SARS-COV-2 Vaccination 10/26/2019, 11/20/2019  ? Pneumococcal Conjugate-13 12/17/2016  ? Pneumococcal Polysaccharide-23 08/22/2015, 05/10/2018  ? Tdap  12/08/2015  ? Zoster Recombinat (Shingrix) 05/10/2018, 08/16/2018  ? ? ?Family History  ?Problem Relation Age of Onset  ? Heart failure Mother   ? Stroke Father   ? Breast cancer Neg Hx   ? ? ? ?Current Outpatient Me

## 2021-12-25 NOTE — Progress Notes (Signed)
Pt reports b/l knee pain and swelling since yesterday. ?

## 2021-12-25 NOTE — Telephone Encounter (Signed)
Patient called stating that since yesterday, she has developed swelling and increased pain in her knees and it is very difficult to walk. She is requesting something be done for her. Please advise ?

## 2022-01-08 ENCOUNTER — Inpatient Hospital Stay: Payer: Medicare Other

## 2022-01-08 ENCOUNTER — Inpatient Hospital Stay: Payer: Medicare Other | Attending: Internal Medicine

## 2022-01-08 ENCOUNTER — Inpatient Hospital Stay (HOSPITAL_BASED_OUTPATIENT_CLINIC_OR_DEPARTMENT_OTHER): Payer: Medicare Other | Admitting: Internal Medicine

## 2022-01-08 DIAGNOSIS — Z79899 Other long term (current) drug therapy: Secondary | ICD-10-CM | POA: Diagnosis not present

## 2022-01-08 DIAGNOSIS — Z5112 Encounter for antineoplastic immunotherapy: Secondary | ICD-10-CM | POA: Insufficient documentation

## 2022-01-08 DIAGNOSIS — R5383 Other fatigue: Secondary | ICD-10-CM | POA: Diagnosis not present

## 2022-01-08 DIAGNOSIS — C641 Malignant neoplasm of right kidney, except renal pelvis: Secondary | ICD-10-CM

## 2022-01-08 LAB — COMPREHENSIVE METABOLIC PANEL
ALT: 17 U/L (ref 0–44)
AST: 11 U/L — ABNORMAL LOW (ref 15–41)
Albumin: 4.2 g/dL (ref 3.5–5.0)
Alkaline Phosphatase: 120 U/L (ref 38–126)
Anion gap: 7 (ref 5–15)
BUN: 52 mg/dL — ABNORMAL HIGH (ref 8–23)
CO2: 19 mmol/L — ABNORMAL LOW (ref 22–32)
Calcium: 9.6 mg/dL (ref 8.9–10.3)
Chloride: 111 mmol/L (ref 98–111)
Creatinine, Ser: 2.03 mg/dL — ABNORMAL HIGH (ref 0.44–1.00)
GFR, Estimated: 25 mL/min — ABNORMAL LOW (ref >60)
Glucose, Bld: 129 mg/dL — ABNORMAL HIGH (ref 70–99)
Potassium: 4.2 mmol/L (ref 3.5–5.1)
Sodium: 137 mmol/L (ref 135–145)
Total Bilirubin: 0.5 mg/dL (ref 0.3–1.2)
Total Protein: 7.5 g/dL (ref 6.5–8.1)

## 2022-01-08 LAB — CBC WITH DIFFERENTIAL/PLATELET
Abs Immature Granulocytes: 0.05 10*3/uL (ref 0.00–0.07)
Basophils Absolute: 0 10*3/uL (ref 0.0–0.1)
Basophils Relative: 0 %
Eosinophils Absolute: 0.2 10*3/uL (ref 0.0–0.5)
Eosinophils Relative: 2 %
HCT: 34.6 % — ABNORMAL LOW (ref 36.0–46.0)
Hemoglobin: 10.6 g/dL — ABNORMAL LOW (ref 12.0–15.0)
Immature Granulocytes: 1 %
Lymphocytes Relative: 20 %
Lymphs Abs: 1.7 10*3/uL (ref 0.7–4.0)
MCH: 28 pg (ref 26.0–34.0)
MCHC: 30.6 g/dL (ref 30.0–36.0)
MCV: 91.5 fL (ref 80.0–100.0)
Monocytes Absolute: 0.9 10*3/uL (ref 0.1–1.0)
Monocytes Relative: 11 %
Neutro Abs: 5.6 10*3/uL (ref 1.7–7.7)
Neutrophils Relative %: 66 %
Platelets: 186 10*3/uL (ref 150–400)
RBC: 3.78 MIL/uL — ABNORMAL LOW (ref 3.87–5.11)
RDW: 20 % — ABNORMAL HIGH (ref 11.5–15.5)
WBC: 8.4 10*3/uL (ref 4.0–10.5)
nRBC: 0 % (ref 0.0–0.2)

## 2022-01-08 MED ORDER — SODIUM CHLORIDE 0.9 % IV SOLN
200.0000 mg | Freq: Once | INTRAVENOUS | Status: AC
  Administered 2022-01-08: 200 mg via INTRAVENOUS
  Filled 2022-01-08: qty 8

## 2022-01-08 MED ORDER — SODIUM CHLORIDE 0.9 % IV SOLN
Freq: Once | INTRAVENOUS | Status: AC
  Filled 2022-01-08: qty 250

## 2022-01-08 NOTE — Progress Notes (Signed)
[facility] ?CONSULT NOTE ? ?Patient Care Team: ?Tracie Harrier, MD as PCP - General (Internal Medicine) ? ?CHIEF COMPLAINTS/PURPOSE OF CONSULTATION: Kidney cancer ? ? 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 ?MARGINS  ?Margin Status: All margins negative for invasive carcinoma  ? ?REGIONAL LYMPH NODES  ?Regional Lymph Node Status: Not applicable (no regional lymph nodes  ?submitted or found)  ? ?DISTANT METASTASIS  ?Distant Site(s) Involved, if applicable: Not applicable  ? ?PATHOLOGIC STAGE CLASSIFICATION (pTNM, AJCC 8th Edition):  ?TNM Descriptors: Applicable  ?pT3a  ?pN - Not assigned (no lymph nodes submitted or found)  ?pM - Not applicable  ? ?# PT3A- stage III; G-3; no sarcomatoid features [right nephrectomy- Dr.Sninksi]. ? ?# FEB 14th, 2023- CT AP [GI; [facility]]- . Large mass exophytic extending from lower to mid pole of the RIGHT kidney consistent with renal neoplasm. [Dr.Sninski; [facility]] ? ?# FEB 2023- Right lower lobe subpleural 4 mm lung nodule; bone survey negative ? ?#April 7th, 2023- On adjuvant [name]  ? ? # CKD III [ sec to HTN > 20 years; none now]; Anemia- EGD/colo- 2023- [[facility]-GI]; Dr.Kowalski [No CAD] ?  ?Clear cell renal cell carcinoma, right ([place])  ?12/12/2021 Initial Diagnosis  ? Clear cell renal cell carcinoma, right ([facility]) ? ?  ?12/18/2021 -  Chemotherapy  ? Patient is on Treatment Plan : RENAL CELL Pembrolizumab (200) q21d  ? ?  ?  ?01/08/2022 Cancer Staging  ? Staging form: Kidney, AJCC 8th Edition ?- Pathologic: Stage III (pT3a, pN0, cM0) -  Signed by Cammie Sickle, MD on 01/08/2022 ? ?  ? ? ? ?HISTORY OF PRESENTING ILLNESS: Ambulating independently.  Accompanied by husband.  ? ?Gabrielle Savage 76 y.o.  female with history of stage III kidney cancer; CKD stage III currently on adjuvant Beryle Flock is here for follow-up.   ? ?After the first cycle of [name] patient noted to have bilateral j knee joint pains approximately 10 to 14 days later.  Improved with steroids over 3 to 4 days.  And also improved with hydrocodone.  Patient denies any worsening swelling or pain at this time.  She is back to baseline.  Denies any history of rheumatoid arthritis.  History of osteoarthritis. ? ?Complains of ongoing fatigue.  Denies any blood in stools or black-colored stools.  Any diarrhea. ? ?Review of Systems  ?Constitutional:  Positive for malaise/fatigue and weight loss. Negative for chills and fever.  ?HENT:  Negative for nosebleeds and sore throat.   ?Eyes:  Negative for double vision.  ?Respiratory:  Positive for shortness of breath. Negative for cough, hemoptysis, sputum production and wheezing.   ?Cardiovascular:  Negative for chest pain, palpitations, orthopnea and leg swelling.  ?Gastrointestinal:  Negative for abdominal pain, blood in stool, constipation, diarrhea, heartburn, melena, nausea and vomiting.  ?Genitourinary:  Negative for dysuria, frequency and urgency.  ?Musculoskeletal:  Negative for back pain and joint pain.  ?Skin: Negative.  Negative for itching and rash.  ?Neurological:  Negative for dizziness, tingling, focal weakness, weakness and headaches.  ?Endo/Heme/Allergies:  Does not bruise/bleed easily.  ?Psychiatric/Behavioral:  Negative for depression.  The patient is not nervous/anxious and does not have insomnia.    ? ?MEDICAL HISTORY:  ?Past Medical History:  ?Diagnosis Date  ? Aortic atherosclerosis ([place])   ? Arthritis   ? B12 deficiency   ? Bradycardia   ? CAD (coronary artery disease)   ? Carotid atherosclerosis   ? CKD (chronic  kidney disease), stage III ([place])   ? Diastolic dysfunction 37/85/8850  ? a.)  TTE 09/03/2021: EF 63%; no RWMAs; LA mildly enlarged; trivial TR/PR, mild AR, moderate MR; G1DD.  ? DOE (dyspnea on exertion)   ? Fatigue   ? Gout   ? HLD (hyperlipidemia)   ? Hypertension   ? IDA (iron deficiency anemia)   ? Iliotibial band syndrome, left leg   ? MDD (major depressive disorder)   ? OAB (overactive bladder)   ? Obesity   ? PAC (premature atrial contraction)   ? noted on Holter  ? PONV (postoperative nausea and vomiting)   ? a.) single episode in 03/2013  ? PVC (premature ventricular contraction)   ? noted on Holter  ? Right kidney mass 10/27/2021  ? a.) CT 10/27/2021 -- mass measuring 8.3 x 6.8 x 8.1 cm  ? Sensorineural hearing loss (SNHL) of both ears   ? SUI (stress urinary incontinence, female)   ? SVT (supraventricular tachycardia) ([place])   ? noted on Holter  ? T2DM (type 2 diabetes mellitus) ([place])   ? Uterovaginal prolapse   ? Vitamin D deficiency   ? Voiding dysfunction   ? ? ?SURGICAL HISTORY: ?Past Surgical History:  ?Procedure Laterality Date  ? ABDOMINAL HYSTERECTOMY    ? partial  ? BLADDER SUSPENSION    ? CHOLECYSTECTOMY    ? COLONOSCOPY N/A 07/25/2017  ? Procedure: COLONOSCOPY;  Surgeon: [name], MD;  Location: [facility] END[facility]COPY;  Service: Endoscopy;  Laterality: N/A;  ? COLONOSCOPY WITH PROPOFOL N/A 10/19/2021  ? Procedure: COLONOSCOPY WITH PROPOFOL;  Surgeon: [name], MD;  Location: [facility] ENDOSCOPY;  Service: Endoscopy;  Laterality: N/A;  DM  ? ESOPHAGOGASTRODUODENOSCOPY (EGD) WITH PROPOFOL N/A 10/19/2021  ? Procedure: ESOPHAGOGASTRODUODENOSCOPY (EGD) WITH PROPOFOL;  Surgeon: [name], MD;  Location: [facility] ENDOSCOPY;  Service: Endoscopy;  Laterality: N/A;  ? JOINT REPLACEMENT Bilateral   ? PARTIAL KNEE REPLACEMENT  ? KNEE SURGERY    ? LAPAROSCOPIC NEPHRECTOMY, HAND ASSISTED Right 11/20/2021  ? Procedure: HAND ASSISTED LAPAROSCOPIC RADICAL NEPHRECTOMY;  Surgeon: Billey Co, MD;   Location: [facility] ORS;  Service: Urology;  Laterality: Right;  ? PARATHYROIDECTOMY    ? ? ?SOCIAL HISTORY: ?Social History  ? ?Socioeconomic History  ? Marital status: Married  ?  Spouse name: Not on file  ? Number of children: Not on file  ? Years of education: Not on file  ? Highest education level: Not on file  ?Occupational History  ? Not on file  ?Tobacco Use  ? Smoking status: Never  ?  Passive exposure: Never  ? Smokeless tobacco: Never  ?Vaping Use  ? Vaping Use: Never used  ?Substance and Sexual Activity  ? Alcohol use: Not Currently  ?  Comment: occ  ? Drug use: No  ? Sexual activity: Not Currently  ?Other Topics Concern  ? Not on file  ?Social History Narrative  ? Lives in [place] - close hospital; with husband; 3 biological children [[place]; [place]; [place]]. Never smoked; glass of wine rare. 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   ? ?Social Determinants of Health  ? ?Financial Resource Strain: Low Risk   ?  Difficulty of Paying Living Expenses: Not hard at all  ?Food Insecurity: No Food Insecurity  ? Worried About Charity fundraiser in the Last Year: Never true  ? Ran Out of Food in the Last Year: Never true  ?Transportation Needs: No Transportation Needs  ? Lack of Transportation (Medical): No  ? Lack of Transportation (Non-Medical): No  ?Physical Activity: Insufficiently Active  ? Days of Exercise per Week: 2 days  ? Minutes of Exercise per Session: 30 min  ?Stress: No Stress Concern Present  ? Feeling of Stress : Only a little  ?Social Connections: Moderately Integrated  ? Frequency of Communication with Friends and Family: Three times a week  ? Frequency of Social Gatherings with Friends and Family: Three times a week  ? Attends Religious Services: 1 to 4 times per year  ? Active Member of Clubs or Organizations: No  ? Attends Archivist Meetings: Never  ? Marital Status: Married  ?Intimate Partner Violence: Not At Risk  ? Fear of Current or Ex-Partner: No  ? Emotionally Abused: No  ?  Physically Abused: No  ? Sexually Abused: No  ? ? ?FAMILY HISTORY: ?Family History  ?Problem Relation Age of Onset  ? Heart failure Mother   ? Stroke Father   ? Breast cancer Neg Hx   ? ? ?ALLERGIES:  is all

## 2022-01-08 NOTE — Progress Notes (Signed)
Pt states she didn't finish the steroid for her knee pain because the pain stopped after a week. And seems to be doing good. ?

## 2022-01-08 NOTE — Progress Notes (Signed)
Nutrition Assessment ? ? ?Reason for Assessment:  ? ?Referral concerns about DM and HTN ? ? ?ASSESSMENT:  ?76 year old female with renal cell carcinoma.  Past medical history of CKD stage III, DM, HTN, CAD, s/p nephrectomy.  Patient receiving [place].  ? ?Met with patient in infusion.  Patient reports that prior to diagnosis lost about 30 lb unintentionally because did not have much of an appetite.  Has gained 6 lbs back and appetite has returned.  Does not want to gain weight back and concerned about nutritionally what she can do to protect kidney and help blood glucose. ? ? ?Medications: Vit D, Fe sulfate, metformin, compazine ? ? ?Labs: glucose 129, BUN 52, creatinine 2.03 ? ? ?Anthropometrics:  ? ?Height: 62 inches ?Weight: 173 lb 12.8 oz today ?10/19/21 170 lb ?02/24/21 199 lb ?BMI: 31 ? ? ?NUTRITION DIAGNOSIS: Food and nutrition related knowledge deficit related to chronic conditions. ? ? ?INTERVENTION:  ?Discussed importance of low sodium diet for kidney protection.  Handout from AND provided to patient along with flavoring tips ?Discussed ways to control blood glucose as well.  ?Contact information provided ? ? ?MONITORING, EVALUATION, GOAL: weight trends, intake ? ? ?Next Visit: ~ 6 weeks during treatment ? ?Yanice Maqueda B. Zenia Resides, RD, LDN ?Registered Dietitian ?[phone] ? ? ? ? ? ?

## 2022-01-08 NOTE — Patient Instructions (Signed)
[facility] [place]  Discharge Instructions: ?Thank you for choosing [facility] to provide your oncology and hematology care.  ?If you have a lab appointment with the [facility], please go directly to the [facility] and check in at the registration area. ? ?Wear comfortable clothing and clothing appropriate for easy access to any Portacath or PICC line.  ? ?We strive to give you quality time with your provider. You may need to reschedule your appointment if you arrive late (15 or more minutes).  Arriving late affects you and other patients whose appointments are after yours.  Also, if you miss three or more appointments without notifying the office, you may be dismissed from the clinic at the provider?s discretion.    ?  ?For prescription refill requests, have your pharmacy contact our office and allow 72 hours for refills to be completed.   ? ?Today you received the following chemotherapy and/or immunotherapy agents     ?  ?To help prevent nausea and vomiting after your treatment, we encourage you to take your nausea medication as directed. ? ?BELOW ARE SYMPTOMS THAT SHOULD BE REPORTED IMMEDIATELY: ?*FEVER GREATER THAN 100.4 F (38 ?C) OR HIGHER ?*CHILLS OR SWEATING ?*NAUSEA AND VOMITING THAT IS NOT CONTROLLED WITH YOUR NAUSEA MEDICATION ?*UNUSUAL SHORTNESS OF BREATH ?*UNUSUAL BRUISING OR BLEEDING ?*URINARY PROBLEMS (pain or burning when urinating, or frequent urination) ?*BOWEL PROBLEMS (unusual diarrhea, constipation, pain near the anus) ?TENDERNESS IN MOUTH AND THROAT WITH OR WITHOUT PRESENCE OF ULCERS (sore throat, sores in mouth, or a toothache) ?UNUSUAL RASH, SWELLING OR PAIN  ?UNUSUAL VAGINAL DISCHARGE OR ITCHING  ? ?Items with * indicate a potential emergency and should be followed up as soon as possible or go to the Emergency Department if any problems should occur. ? ?Please show the CHEMOTHERAPY ALERT CARD or IMMUNOTHERAPY ALERT CARD at check-in to the  Emergency Department and triage nurse. ? ?Should you have questions after your visit or need to cancel or reschedule your appointment, please contact [facility] [place] [facility] [place]  [phone] and follow the prompts.  Office hours are 8:00 a.m. to 4:30 p.m. Monday - Friday. Please note that voicemails left after 4:00 p.m. may not be returned until the following business day.  We are closed weekends and major holidays. You have access to a nurse at all times for urgent questions. Please call the main number to the clinic [phone] and follow the prompts. ? ?For any non-urgent questions, you may also contact your provider using MyChart. We now offer e-Visits for anyone 26 and older to request care online for non-urgent symptoms. For details visit [email or web address]. ?  ?Also download the MyChart app! Go to the app store, search "MyChart", open the app, select [facility], and log in with your MyChart username and password. ? ?Due to Covid, a mask is required upon entering the hospital/clinic. If you do not have a mask, one will be given to you upon arrival. For doctor visits, patients may have 1 support person aged 78 or older with them. For treatment visits, patients cannot have anyone with them due to current Covid guidelines and our immunocompromised population. Pembrolizumab injection ?What is this medication? ?PEMBROLIZUMAB (pem broe liz ue mab) is a monoclonal antibody. It is used to treat certain types of cancer. ?This medicine may be used for other purposes; ask your health care provider or pharmacist if you have questions. ?COMMON BRAND NAME(S): [name] ?What should I tell my care team before I  take this medication? ?They need to know if you have any of these conditions: ?autoimmune diseases like Crohn's disease, ulcerative colitis, or lupus ?have had or planning to have an allogeneic stem cell transplant (uses someone else's stem cells) ?history of organ transplant ?history of chest  radiation ?nervous system problems like myasthenia gravis or Guillain-Barre syndrome ?an unusual or allergic reaction to pembrolizumab, other medicines, foods, dyes, or preservatives ?pregnant or trying to get pregnant ?breast-feeding ?How should I use this medication? ?This medicine is for infusion into a vein. It is given by a health care professional in a hospital or clinic setting. ?A special MedGuide will be given to you before each treatment. Be sure to read this information carefully each time. ?Talk to your pediatrician regarding the use of this medicine in children. While this drug may be prescribed for children as young as 6 months for selected conditions, precautions do apply. ?Overdosage: If you think you have taken too much of this medicine contact a poison control center or emergency room at once. ?NOTE: This medicine is only for you. Do not share this medicine with others. ?What if I miss a dose? ?It is important not to miss your dose. Call your doctor or health care professional if you are unable to keep an appointment. ?What may interact with this medication? ?Interactions have not been studied. ?This list may not describe all possible interactions. Give your health care provider a list of all the medicines, herbs, non-prescription drugs, or dietary supplements you use. Also tell them if you smoke, drink alcohol, or use illegal drugs. Some items may interact with your medicine. ?What should I watch for while using this medication? ?Your condition will be monitored carefully while you are receiving this medicine. ?You may need blood work done while you are taking this medicine. ?Do not become pregnant while taking this medicine or for 4 months after stopping it. Women should inform their doctor if they wish to become pregnant or think they might be pregnant. There is a potential for serious side effects to an unborn child. Talk to your health care professional or pharmacist for more information. Do  not breast-feed an infant while taking this medicine or for 4 months after the last dose. ?What side effects may I notice from receiving this medication? ?Side effects that you should report to your doctor or health care professional as soon as possible: ?allergic reactions like skin rash, itching or hives, swelling of the face, lips, or tongue ?bloody or black, tarry ?breathing problems ?changes in vision ?chest pain ?chills ?confusion ?constipation ?cough ?diarrhea ?dizziness or feeling faint or lightheaded ?fast or irregular heartbeat ?fever ?flushing ?joint pain ?low blood counts - this medicine may decrease the number of white blood cells, red blood cells and platelets. You may be at increased risk for infections and bleeding. ?muscle pain ?muscle weakness ?pain, tingling, numbness in the hands or feet ?persistent headache ?redness, blistering, peeling or loosening of the skin, including inside the mouth ?signs and symptoms of high blood sugar such as dizziness; dry mouth; dry skin; fruity breath; nausea; stomach pain; increased hunger or thirst; increased urination ?signs and symptoms of kidney injury like trouble passing urine or change in the amount of urine ?signs and symptoms of liver injury like dark urine, light-colored stools, loss of appetite, nausea, right upper belly pain, yellowing of the eyes or skin ?sweating ?swollen lymph nodes ?weight loss ?Side effects that usually do not require medical attention (report to your doctor or health care  professional if they continue or are bothersome): ?decreased appetite ?hair loss ?tiredness ?This list may not describe all possible side effects. Call your doctor for medical advice about side effects. You may report side effects to FDA at 1-800-FDA-1088. ?Where should I keep my medication? ?This drug is given in a hospital or clinic and will not be stored at home. ?NOTE: This sheet is a summary. It may not cover all possible information. If you have questions  about this medicine, talk to your doctor, pharmacist, or health care provider. ?? 2023 Elsevier/Gold Standard (2021-07-31 00:00:00) ? ?

## 2022-01-08 NOTE — Assessment & Plan Note (Addendum)
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  Tolerating [name] fairly well except for joint pain see below. ? ?#Proceed with [name] No. 2 today. Labs today reviewed;  acceptable for treatment today.  ? ?#Anemia: Hemoglobin around 10 overall stable/improving.  ? ?# Bil Knee pain:?  Secondary to [facility] s/p steroids  X 4-5 days; on hydrocodone none at this time.  Monitor closely. ? ?#CKD stage IV- GFR ~25; discussed re: avoiding nephrotoxic agents;  Continue enough hydration. Monitor closely.  ? ?# HTN- 157/70- recommend monitoring bP at home; discussed re: avoiding nephrotoxic agents;  ?  ? ?# DISPOSITION: ?# [name] today ?# follow up in 3 weeks- MD; labs- cbc/cmp;TSH; Beryle Flock - Dr.B ? ? ?

## 2022-01-08 NOTE — Progress Notes (Signed)
Creatinine 2.03 today. Per Dr Rogue Bussing okay to proceed with tx today. Pt instructed to increase fluid intake ?

## 2022-01-29 ENCOUNTER — Inpatient Hospital Stay (HOSPITAL_BASED_OUTPATIENT_CLINIC_OR_DEPARTMENT_OTHER): Payer: Medicare Other | Admitting: Internal Medicine

## 2022-01-29 ENCOUNTER — Inpatient Hospital Stay: Payer: Medicare Other

## 2022-01-29 ENCOUNTER — Inpatient Hospital Stay: Payer: Medicare Other | Attending: Internal Medicine

## 2022-01-29 ENCOUNTER — Encounter: Payer: Self-pay | Admitting: Internal Medicine

## 2022-01-29 DIAGNOSIS — C641 Malignant neoplasm of right kidney, except renal pelvis: Secondary | ICD-10-CM

## 2022-01-29 DIAGNOSIS — R5383 Other fatigue: Secondary | ICD-10-CM

## 2022-01-29 DIAGNOSIS — Z79899 Other long term (current) drug therapy: Secondary | ICD-10-CM | POA: Insufficient documentation

## 2022-01-29 DIAGNOSIS — Z5112 Encounter for antineoplastic immunotherapy: Secondary | ICD-10-CM | POA: Diagnosis present

## 2022-01-29 LAB — CBC WITH DIFFERENTIAL/PLATELET
Abs Immature Granulocytes: 0.02 10*3/uL (ref 0.00–0.07)
Basophils Absolute: 0 10*3/uL (ref 0.0–0.1)
Basophils Relative: 1 %
Eosinophils Absolute: 0.2 10*3/uL (ref 0.0–0.5)
Eosinophils Relative: 3 %
HCT: 35.1 % — ABNORMAL LOW (ref 36.0–46.0)
Hemoglobin: 10.9 g/dL — ABNORMAL LOW (ref 12.0–15.0)
Immature Granulocytes: 0 %
Lymphocytes Relative: 34 %
Lymphs Abs: 2.1 10*3/uL (ref 0.7–4.0)
MCH: 28.4 pg (ref 26.0–34.0)
MCHC: 31.1 g/dL (ref 30.0–36.0)
MCV: 91.4 fL (ref 80.0–100.0)
Monocytes Absolute: 0.6 10*3/uL (ref 0.1–1.0)
Monocytes Relative: 10 %
Neutro Abs: 3.3 10*3/uL (ref 1.7–7.7)
Neutrophils Relative %: 52 %
Platelets: 195 10*3/uL (ref 150–400)
RBC: 3.84 MIL/uL — ABNORMAL LOW (ref 3.87–5.11)
RDW: 18.6 % — ABNORMAL HIGH (ref 11.5–15.5)
WBC: 6.3 10*3/uL (ref 4.0–10.5)
nRBC: 0 % (ref 0.0–0.2)

## 2022-01-29 LAB — COMPREHENSIVE METABOLIC PANEL
ALT: 8 U/L (ref 0–44)
AST: 11 U/L — ABNORMAL LOW (ref 15–41)
Albumin: 4.1 g/dL (ref 3.5–5.0)
Alkaline Phosphatase: 118 U/L (ref 38–126)
Anion gap: 8 (ref 5–15)
BUN: 68 mg/dL — ABNORMAL HIGH (ref 8–23)
CO2: 20 mmol/L — ABNORMAL LOW (ref 22–32)
Calcium: 10.2 mg/dL (ref 8.9–10.3)
Chloride: 108 mmol/L (ref 98–111)
Creatinine, Ser: 2.35 mg/dL — ABNORMAL HIGH (ref 0.44–1.00)
GFR, Estimated: 21 mL/min — ABNORMAL LOW (ref >60)
Glucose, Bld: 127 mg/dL — ABNORMAL HIGH (ref 70–99)
Potassium: 4.8 mmol/L (ref 3.5–5.1)
Sodium: 136 mmol/L (ref 135–145)
Total Bilirubin: 0.4 mg/dL (ref 0.3–1.2)
Total Protein: 7.2 g/dL (ref 6.5–8.1)

## 2022-01-29 LAB — TSH: TSH: 3.795 u[IU]/mL (ref 0.350–4.500)

## 2022-01-29 MED ORDER — SODIUM CHLORIDE 0.9 % IV SOLN
200.0000 mg | Freq: Once | INTRAVENOUS | Status: AC
  Administered 2022-01-29: 200 mg via INTRAVENOUS
  Filled 2022-01-29: qty 8

## 2022-01-29 MED ORDER — SODIUM CHLORIDE 0.9 % IV SOLN
Freq: Once | INTRAVENOUS | Status: AC
  Filled 2022-01-29: qty 250

## 2022-01-29 MED ORDER — HEPARIN SOD (PORK) LOCK FLUSH 100 UNIT/ML IV SOLN
500.0000 [IU] | Freq: Once | INTRAVENOUS | Status: DC | PRN
  Filled 2022-01-29: qty 5

## 2022-01-29 NOTE — Assessment & Plan Note (Addendum)
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  Tolerating [name] fairly well except for joint pain see below.  #Proceed with [name] #3  today. Labs today reviewed;  acceptable for treatment today.   #Anemia: Hemoglobin around 10 overall stable/improving.   #Short-term memory deficit as per patient: No clinical suspicion for metastatic disease.  Question related to poor sleep quality at night/because of frequent urination.  Recommend stop drinking fluids after 7:00 in the evening.  # Bil Knee pain:?  Secondary to [name] s/p steroids  X 4-5 days- resolved; monitor closely.  #CKD stage IV- GFR ~21; discussed re: avoiding nephrotoxic agents;  Continue enough hydration. Monitor closely. [facility] Nephrology evaluation on June 1st]; unlikley any significant toxicity from [place].   # HTN- Improved; discussed re: avoiding nephrotoxic agents-STABLE.    # DISPOSITION: # [name] today # follow up in 3 weeks- MD; labs- cbc/cmp; [name] - Dr.B

## 2022-01-29 NOTE — Patient Instructions (Signed)
[facility]  Discharge Instructions: ?Thank you for choosing [facility] to provide your oncology and hematology care.  ?If you have a lab appointment with the Cancer Center, please go directly to the Cancer Center and check in at the registration area. ? ?Wear comfortable clothing and clothing appropriate for easy access to any Portacath or PICC line.  ? ?We strive to give you quality time with your provider. You may need to reschedule your appointment if you arrive late (15 or more minutes).  Arriving late affects you and other patients whose appointments are after yours.  Also, if you miss three or more appointments without notifying the office, you may be dismissed from the clinic at the provider?s discretion.    ?  ?For prescription refill requests, have your pharmacy contact our office and allow 72 hours for refills to be completed.   ? ?Today you received the following chemotherapy and/or immunotherapy agents [name]    ?  ?To help prevent nausea and vomiting after your treatment, we encourage you to take your nausea medication as directed. ? ?BELOW ARE SYMPTOMS THAT SHOULD BE REPORTED IMMEDIATELY: ?*FEVER GREATER THAN 100.4 F (38 ?C) OR HIGHER ?*CHILLS OR SWEATING ?*NAUSEA AND VOMITING THAT IS NOT CONTROLLED WITH YOUR NAUSEA MEDICATION ?*UNUSUAL SHORTNESS OF BREATH ?*UNUSUAL BRUISING OR BLEEDING ?*URINARY PROBLEMS (pain or burning when urinating, or frequent urination) ?*BOWEL PROBLEMS (unusual diarrhea, constipation, pain near the anus) ?TENDERNESS IN MOUTH AND THROAT WITH OR WITHOUT PRESENCE OF ULCERS (sore throat, sores in mouth, or a toothache) ?UNUSUAL RASH, SWELLING OR PAIN  ?UNUSUAL VAGINAL DISCHARGE OR ITCHING  ? ?Items with * indicate a potential emergency and should be followed up as soon as possible or go to the Emergency Department if any problems should occur. ? ?Please show the CHEMOTHERAPY ALERT CARD or IMMUNOTHERAPY ALERT CARD at check-in to  the Emergency Department and triage nurse. ? ?Should you have questions after your visit or need to cancel or reschedule your appointment, please contact [facility]  [phone] and follow the prompts.  Office hours are 8:00 a.m. to 4:30 p.m. Monday - Friday. Please note that voicemails left after 4:00 p.m. may not be returned until the following business day.  We are closed weekends and major holidays. You have access to a nurse at all times for urgent questions. Please call the main number to the clinic [phone] and follow the prompts. ? ?For any non-urgent questions, you may also contact your provider using MyChart. We now offer e-Visits for anyone 18 and older to request care online for non-urgent symptoms. For details visit [email or web address]. ?  ?Also download the MyChart app! Go to the app store, search "MyChart", open the app, select , and log in with your MyChart username and password. ? ?Due to Covid, a mask is required upon entering the hospital/clinic. If you do not have a mask, one will be given to you upon arrival. For doctor visits, patients may have 1 support person aged 18 or older with them. For treatment visits, patients cannot have anyone with them due to current Covid guidelines and our immunocompromised population.  ? ?Pembrolizumab injection ?What is this medication? ?PEMBROLIZUMAB (pem broe liz ue mab) is a monoclonal antibody. It is used to treat certain types of cancer. ?This medicine may be used for other purposes; ask your health care provider or pharmacist if you have questions. ?COMMON BRAND NAME(S): [name] ?What should I tell my care team   before I take this medication? ?They need to know if you have any of these conditions: ?autoimmune diseases like Crohn's disease, ulcerative colitis, or lupus ?have had or planning to have an allogeneic stem cell transplant (uses someone else's stem cells) ?history of organ transplant ?history  of chest radiation ?nervous system problems like myasthenia gravis or Guillain-Barre syndrome ?an unusual or allergic reaction to pembrolizumab, other medicines, foods, dyes, or preservatives ?pregnant or trying to get pregnant ?breast-feeding ?How should I use this medication? ?This medicine is for infusion into a vein. It is given by a health care professional in a hospital or clinic setting. ?A special MedGuide will be given to you before each treatment. Be sure to read this information carefully each time. ?Talk to your pediatrician regarding the use of this medicine in children. While this drug may be prescribed for children as young as 6 months for selected conditions, precautions do apply. ?Overdosage: If you think you have taken too much of this medicine contact a poison control center or emergency room at once. ?NOTE: This medicine is only for you. Do not share this medicine with others. ?What if I miss a dose? ?It is important not to miss your dose. Call your doctor or health care professional if you are unable to keep an appointment. ?What may interact with this medication? ?Interactions have not been studied. ?This list may not describe all possible interactions. Give your health care provider a list of all the medicines, herbs, non-prescription drugs, or dietary supplements you use. Also tell them if you smoke, drink alcohol, or use illegal drugs. Some items may interact with your medicine. ?What should I watch for while using this medication? ?Your condition will be monitored carefully while you are receiving this medicine. ?You may need blood work done while you are taking this medicine. ?Do not become pregnant while taking this medicine or for 4 months after stopping it. Women should inform their doctor if they wish to become pregnant or think they might be pregnant. There is a potential for serious side effects to an unborn child. Talk to your health care professional or pharmacist for more  information. Do not breast-feed an infant while taking this medicine or for 4 months after the last dose. ?What side effects may I notice from receiving this medication? ?Side effects that you should report to your doctor or health care professional as soon as possible: ?allergic reactions like skin rash, itching or hives, swelling of the face, lips, or tongue ?bloody or black, tarry ?breathing problems ?changes in vision ?chest pain ?chills ?confusion ?constipation ?cough ?diarrhea ?dizziness or feeling faint or lightheaded ?fast or irregular heartbeat ?fever ?flushing ?joint pain ?low blood counts - this medicine may decrease the number of white blood cells, red blood cells and platelets. You may be at increased risk for infections and bleeding. ?muscle pain ?muscle weakness ?pain, tingling, numbness in the hands or feet ?persistent headache ?redness, blistering, peeling or loosening of the skin, including inside the mouth ?signs and symptoms of high blood sugar such as dizziness; dry mouth; dry skin; fruity breath; nausea; stomach pain; increased hunger or thirst; increased urination ?signs and symptoms of kidney injury like trouble passing urine or change in the amount of urine ?signs and symptoms of liver injury like dark urine, light-colored stools, loss of appetite, nausea, right upper belly pain, yellowing of the eyes or skin ?sweating ?swollen lymph nodes ?weight loss ?Side effects that usually do not require medical attention (report to your doctor or   health care professional if they continue or are bothersome): ?decreased appetite ?hair loss ?tiredness ?This list may not describe all possible side effects. Call your doctor for medical advice about side effects. You may report side effects to FDA at 1-800-FDA-1088. ?Where should I keep my medication? ?This drug is given in a hospital or clinic and will not be stored at home. ?NOTE: This sheet is a summary. It may not cover all possible information. If you  have questions about this medicine, talk to your doctor, pharmacist, or health care provider. ?? 2023 Elsevier/Gold Standard (2021-07-31 00:00:00) ? ?

## 2022-01-29 NOTE — Progress Notes (Signed)
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  Histologic Grade (WHO / ISUP Grade): G3, nucleoli conspicuous and  eosinophilic at 400 X magnification  Tumor Extent: Extends into major vein (segmental branch adjacent to  renal sinus)  Sarcomatoid Features: Not identified  Rhabdoid Features: Not identified  Tumor Necrosis: Present, microscopic and macroscopic, 20%   MARGINS  Margin Status: All margins negative for invasive carcinoma   REGIONAL LYMPH NODES  Regional Lymph Node Status: Not applicable (no regional lymph nodes  submitted or found)   DISTANT METASTASIS  Distant Site(s) Involved, if applicable: Not applicable   PATHOLOGIC STAGE CLASSIFICATION (pTNM, AJCC 8th Edition):  TNM Descriptors: Applicable  [id number]  pN - Not assigned (no lymph nodes submitted or found)  pM - Not applicable   # PP5K- stage III; G-3; no sarcomatoid features [right nephrectomy- Dr.Sninksi].  # FEB 14th, 2023- CT AP [GI; [facility]]- . Large mass exophytic extending from lower to mid pole of the RIGHT kidney consistent with renal neoplasm. [Dr.Sninski; [facility]]  # FEB 2023- Right lower lobe subpleural 4 mm lung nodule; bone survey negative  #April 7th, 2023- On adjuvant [name]    # CKD III [ sec to HTN > 20 years; none now]; Anemia- EGD/colo- 2023- [[facility]]; Dr.Kowalski [No CAD]   Clear cell renal cell carcinoma, right ([place])  12/12/2021 Initial Diagnosis   Clear cell renal cell carcinoma, right ([place])    12/18/2021 -  Chemotherapy   Patient is on Treatment Plan : RENAL CELL Pembrolizumab (200) q21d      01/08/2022 Cancer Staging   Staging form: Kidney, AJCC  8th Edition - Pathologic: Stage III (pT3a, pN0, cM0) - Signed by Cammie Sickle, MD on 01/08/2022       HISTORY OF PRESENTING ILLNESS: Ambulating independently.  Alone.   Troy Sine 76 y.o.  female with history of stage III kidney cancer; CKD stage III currently on adjuvant Beryle Flock is here for follow-up.    Complains of memory- more short term issues- more so in last 3 week. Poor sleep quality sec to going to urinate multiple.  Denies any headaches.  Denies any difficulty talking.  Denies any blood in stools or black-colored stools.  Denies any diarrhea.  Review of Systems  Constitutional:  Positive for malaise/fatigue and weight loss. Negative for chills and fever.  HENT:  Negative for nosebleeds and sore throat.   Eyes:  Negative for double vision.  Respiratory:  Positive for shortness of breath. Negative for cough, hemoptysis, sputum production and wheezing.   Cardiovascular:  Negative for chest pain, palpitations, orthopnea and leg swelling.  Gastrointestinal:  Negative for abdominal pain, blood in stool, constipation, diarrhea, heartburn, melena, nausea and vomiting.  Genitourinary:  Negative for dysuria, frequency and urgency.  Musculoskeletal:  Negative for back pain and joint pain.  Skin: Negative.  Negative for itching and rash.  Neurological:  Negative for dizziness, tingling, focal weakness, weakness and headaches.  Endo/Heme/Allergies:  Does not bruise/bleed easily.  Psychiatric/Behavioral:  Negative for depression. The patient is not nervous/anxious and does not have insomnia.     MEDICAL HISTORY:  Past Medical History:  Diagnosis Date   Aortic atherosclerosis ([place])    Arthritis  B12 deficiency    Bradycardia    CAD (coronary artery disease)    Carotid atherosclerosis    CKD (chronic kidney disease), stage III (HCC)    Diastolic dysfunction 44/97/5300   a.)  TTE 09/03/2021: EF 63%; no RWMAs; LA mildly enlarged; trivial TR/PR, mild AR, moderate MR;  G1DD.   DOE (dyspnea on exertion)    Fatigue    Gout    HLD (hyperlipidemia)    Hypertension    IDA (iron deficiency anemia)    Iliotibial band syndrome, left leg    MDD (major depressive disorder)    OAB (overactive bladder)    Obesity    PAC (premature atrial contraction)    noted on Holter   PONV (postoperative nausea and vomiting)    a.) single episode in 03/2013   PVC (premature ventricular contraction)    noted on Holter   Right kidney mass 10/27/2021   a.) CT 10/27/2021 -- mass measuring 8.3 x 6.8 x 8.1 cm   Sensorineural hearing loss (SNHL) of both ears    SUI (stress urinary incontinence, female)    SVT (supraventricular tachycardia) ([place])    noted on Holter   T2DM (type 2 diabetes mellitus) ([place])    Uterovaginal prolapse    Vitamin D deficiency    Voiding dysfunction     SURGICAL HISTORY: Past Surgical History:  Procedure Laterality Date   ABDOMINAL HYSTERECTOMY     partial   BLADDER SUSPENSION     CHOLECYSTECTOMY     COLONOSCOPY N/A 07/25/2017   Procedure: COLONOSCOPY;  Surgeon: [name], MD;  Location[facility] ENDOSCOPY;  Service: Endoscopy;  Laterality: N/A;   COLONOSCOPY WITH PROPOFOL N/A 10/19/2021   Procedure: COLONOSCOPY WITH PROPOFOL;  Surgeon: [name], MD;  Location: [facility] ENDOSCOPY;  Service: Endoscopy;  Laterality: N/A;  DM   ESOPHAGOGASTRODUODENOSCOPY (EGD) WITH PROPOFOL N/A 10/19/2021   Procedure: ESOPHAGOGASTRODUODENOSCOPY (EGD) WITH PROPOFOL;  Surgeon: [name], MD;  Location: [facility] ENDOSCOPY;  Service: Endoscopy;  Laterality: N/A;   JOINT REPLACEMENT Bilateral    PARTIAL KNEE REPLACEMENT   KNEE SURGERY     LAPAROSCOPIC NEPHRECTOMY, HAND ASSISTED Right 11/20/2021   Procedure: HAND ASSISTED LAPAROSCOPIC RADICAL NEPHRECTOMY;  Surgeon: Billey Co, MD;  Location: [facility] ORS;  Service: Urology;  Laterality: Right;   PARATHYROIDECTOMY      SOCIAL HISTORY: Social History   Socioeconomic History   Marital status:  Married    Spouse name: Not on file   Number of children: Not on file   Years of education: Not on file   Highest education level: Not on file  Occupational History   Not on file  Tobacco Use   Smoking status: Never    Passive exposure: Never   Smokeless tobacco: Never  Vaping Use   Vaping Use: Never used  Substance and Sexual Activity   Alcohol use: Not Currently    Comment: occ   Drug use: No   Sexual activity: Not Currently  Other Topics Concern   Not on file  Social History Narrative   Lives in [place] - close hospital; with husband; 3 biological children [[place]; [place]; [place]]. Never smoked; glass of wine rare. 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    Social Determinants of Health   Financial Resource Strain: Low Risk    Difficulty of Paying Living Expenses: Not hard at all  Food Insecurity: No Food Insecurity   Worried About Charity fundraiser in the Last Year: Never true   Ran Out  of Food in the Last Year: Never true  Transportation Needs: No Transportation Needs   Lack of Transportation (Medical): No   Lack of Transportation (Non-Medical): No  Physical Activity: Insufficiently Active   Days of Exercise per Week: 2 days   Minutes of Exercise per Session: 30 min  Stress: No Stress Concern Present   Feeling of Stress : Only a little  Social Connections: Moderately Integrated   Frequency of Communication with Friends and Family: Three times a week   Frequency of Social Gatherings with Friends and Family: Three times a week   Attends Religious Services: 1 to 4 times per year   Active Member of Clubs or Organizations: No   Attends Archivist Meetings: Never   Marital Status: Married  Human resources officer Violence: Not At Risk   Fear of Current or Ex-Partner: No   Emotionally Abused: No   Physically Abused: No   Sexually Abused: No    FAMILY HISTORY: Family History  Problem Relation Age of Onset   Heart failure Mother    Stroke Father    Lung  cancer Father    Breast cancer Neg Hx     ALLERGIES:  is allergic to codeine, nsaids, and oxycodone.  MEDICATIONS:  Current Outpatient Medications  Medication Sig Dispense Refill   allopurinol (ZYLOPRIM) 300 MG tablet Take 300 mg by mouth every morning.     amLODipine (NORVASC) 5 MG tablet Take 5 mg by mouth every morning.     citalopram (CELEXA) 10 MG tablet Take 10 mg by mouth every morning.     docusate sodium (COLACE) 100 MG capsule Take 1 capsule (100 mg total) by mouth 2 (two) times daily. 20 capsule 0   ergocalciferol (VITAMIN D2) 50000 units capsule Take 50,000 Units by mouth every Saturday.     ferrous sulfate 325 (65 FE) MG tablet Take 325 mg by mouth every other day.     irbesartan (AVAPRO) 150 MG tablet Take 150 mg by mouth every morning.     metFORMIN (GLUCOPHAGE) 500 MG tablet Take 500 mg by mouth every morning.     ondansetron (ZOFRAN) 8 MG tablet Take 1 tablet (8 mg total) by mouth 2 (two) times daily as needed (Nausea or vomiting). 30 tablet 1   prochlorperazine (COMPAZINE) 10 MG tablet Take 1 tablet (10 mg total) by mouth every 6 (six) hours as needed (Nausea or vomiting). 30 tablet 1   vitamin B-12 (CYANOCOBALAMIN) 1000 MCG tablet Take 1,000 mcg by mouth daily.     No current facility-administered medications for this visit.      Marland Kitchen  PHYSICAL EXAMINATION:  Vitals:   01/29/22 0838  BP: (!) 127/53  Pulse: 71  Temp: 98.8 F (37.1 C)  SpO2: 100%   Filed Weights   01/29/22 0838  Weight: 174 lb 3.2 oz (79 kg)    Physical Exam Vitals and nursing note reviewed.  HENT:     Head: Normocephalic and atraumatic.     Mouth/Throat:     Pharynx: Oropharynx is clear.  Eyes:     Extraocular Movements: Extraocular movements intact.     Pupils: Pupils are equal, round, and reactive to light.  Cardiovascular:     Rate and Rhythm: Normal rate and regular rhythm.  Pulmonary:     Comments: Decreased breath sounds bilaterally.  Abdominal:     Palpations: Abdomen is  soft.  Musculoskeletal:        General: Normal range of motion.     Cervical back:  Normal range of motion.  Skin:    General: Skin is warm.  Neurological:     General: No focal deficit present.     Mental Status: She is alert and oriented to person, place, and time.  Psychiatric:        Behavior: Behavior normal.        Judgment: Judgment normal.     LABORATORY DATA:  I have reviewed the data as listed Lab Results  Component Value Date   WBC 6.3 01/29/2022   HGB 10.9 (L) 01/29/2022   HCT 35.1 (L) 01/29/2022   MCV 91.4 01/29/2022   PLT 195 01/29/2022   Recent Labs    12/24/21 0815 01/08/22 0832 01/29/22 0828  NA 138 137 136  K 4.3 4.2 4.8  CL 112* 111 108  CO2 20* 19* 20*  GLUCOSE 136* 129* 127*  BUN 39* 52* 68*  CREATININE 1.63* 2.03* 2.35*  CALCIUM 9.6 9.6 10.2  GFRNONAA 33* 25* 21*  PROT 7.1 7.5 7.2  ALBUMIN 3.6 4.2 4.1  AST 12* 11* 11*  ALT '9 17 8  '$ ALKPHOS 111 120 118  BILITOT 0.3 0.5 0.4    RADIOGRAPHIC STUDIES: I have personally reviewed the radiological images as listed and agreed with the findings in the report. No results found.  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  Tolerating [name] fairly well except for joint pain see below.  #Proceed with [name] #3  today. Labs today reviewed;  acceptable for treatment today.   #Anemia: Hemoglobin around 10 overall stable/improving.   #Short-term memory deficit as per patient: No clinical suspicion for metastatic disease.  Question related to poor sleep quality at night/because of frequent urination.  Recommend stop drinking fluids after 7:00 in the evening.  # Bil Knee pain:?  Secondary to [name] s/p steroids  X 4-5 days- resolved; monitor closely.  #CKD stage IV- GFR ~21; discussed re: avoiding nephrotoxic agents;  Continue enough hydration. Monitor closely. [facility] Nephrology evaluation on June 1st]; unlikley any significant  toxicity from [place].   # HTN- Improved; discussed re: avoiding nephrotoxic agents-STABLE.    # DISPOSITION: # [name] today # follow up in 3 weeks- MD; labs- cbc/cmp; [name] - Dr.B    All questions were answered. The patient knows to call the clinic with any problems, questions or concerns.     Cammie Sickle, MD 01/29/2022 9:10 AM

## 2022-02-11 DIAGNOSIS — Z905 Acquired absence of kidney: Secondary | ICD-10-CM | POA: Insufficient documentation

## 2022-02-11 DIAGNOSIS — F32A Depression, unspecified: Secondary | ICD-10-CM | POA: Insufficient documentation

## 2022-02-11 DIAGNOSIS — R809 Proteinuria, unspecified: Secondary | ICD-10-CM | POA: Insufficient documentation

## 2022-02-11 DIAGNOSIS — E872 Acidosis, unspecified: Secondary | ICD-10-CM | POA: Insufficient documentation

## 2022-02-11 DIAGNOSIS — D631 Anemia in chronic kidney disease: Secondary | ICD-10-CM | POA: Insufficient documentation

## 2022-02-19 ENCOUNTER — Other Ambulatory Visit: Payer: Self-pay | Admitting: Lab

## 2022-02-19 ENCOUNTER — Other Ambulatory Visit: Payer: Self-pay

## 2022-02-19 ENCOUNTER — Inpatient Hospital Stay: Payer: Medicare Other

## 2022-02-19 ENCOUNTER — Inpatient Hospital Stay: Payer: Medicare Other | Attending: Internal Medicine

## 2022-02-19 ENCOUNTER — Encounter: Payer: Self-pay | Admitting: Internal Medicine

## 2022-02-19 ENCOUNTER — Inpatient Hospital Stay (HOSPITAL_BASED_OUTPATIENT_CLINIC_OR_DEPARTMENT_OTHER): Payer: Medicare Other | Admitting: Internal Medicine

## 2022-02-19 VITALS — BP 116/60 | HR 67 | Temp 99.1°F | Resp 16 | Wt 171.4 lb

## 2022-02-19 DIAGNOSIS — R5383 Other fatigue: Secondary | ICD-10-CM | POA: Diagnosis not present

## 2022-02-19 DIAGNOSIS — C641 Malignant neoplasm of right kidney, except renal pelvis: Secondary | ICD-10-CM | POA: Insufficient documentation

## 2022-02-19 DIAGNOSIS — Z5112 Encounter for antineoplastic immunotherapy: Secondary | ICD-10-CM | POA: Insufficient documentation

## 2022-02-19 DIAGNOSIS — Z79899 Other long term (current) drug therapy: Secondary | ICD-10-CM | POA: Insufficient documentation

## 2022-02-19 LAB — COMPREHENSIVE METABOLIC PANEL
ALT: 10 U/L (ref 0–44)
AST: 13 U/L — ABNORMAL LOW (ref 15–41)
Albumin: 4.2 g/dL (ref 3.5–5.0)
Alkaline Phosphatase: 94 U/L (ref 38–126)
Anion gap: 9 (ref 5–15)
BUN: 63 mg/dL — ABNORMAL HIGH (ref 8–23)
CO2: 20 mmol/L — ABNORMAL LOW (ref 22–32)
Calcium: 10.5 mg/dL — ABNORMAL HIGH (ref 8.9–10.3)
Chloride: 109 mmol/L (ref 98–111)
Creatinine, Ser: 2.16 mg/dL — ABNORMAL HIGH (ref 0.44–1.00)
GFR, Estimated: 23 mL/min — ABNORMAL LOW (ref >60)
Glucose, Bld: 138 mg/dL — ABNORMAL HIGH (ref 70–99)
Potassium: 4.1 mmol/L (ref 3.5–5.1)
Sodium: 138 mmol/L (ref 135–145)
Total Bilirubin: 0.5 mg/dL (ref 0.3–1.2)
Total Protein: 7.2 g/dL (ref 6.5–8.1)

## 2022-02-19 LAB — CBC WITH DIFFERENTIAL/PLATELET
Abs Immature Granulocytes: 0.02 10*3/uL (ref 0.00–0.07)
Basophils Absolute: 0 10*3/uL (ref 0.0–0.1)
Basophils Relative: 1 %
Eosinophils Absolute: 0.2 10*3/uL (ref 0.0–0.5)
Eosinophils Relative: 3 %
HCT: 36.7 % (ref 36.0–46.0)
Hemoglobin: 11.5 g/dL — ABNORMAL LOW (ref 12.0–15.0)
Immature Granulocytes: 0 %
Lymphocytes Relative: 31 %
Lymphs Abs: 1.9 10*3/uL (ref 0.7–4.0)
MCH: 29 pg (ref 26.0–34.0)
MCHC: 31.3 g/dL (ref 30.0–36.0)
MCV: 92.7 fL (ref 80.0–100.0)
Monocytes Absolute: 0.5 10*3/uL (ref 0.1–1.0)
Monocytes Relative: 8 %
Neutro Abs: 3.3 10*3/uL (ref 1.7–7.7)
Neutrophils Relative %: 57 %
Platelets: 160 10*3/uL (ref 150–400)
RBC: 3.96 MIL/uL (ref 3.87–5.11)
RDW: 17.2 % — ABNORMAL HIGH (ref 11.5–15.5)
WBC: 5.9 10*3/uL (ref 4.0–10.5)
nRBC: 0 % (ref 0.0–0.2)

## 2022-02-19 LAB — RENAL FUNCTION PANEL
Albumin: 4.2 g/dL (ref 3.5–5.0)
Anion gap: 10 (ref 5–15)
BUN: 63 mg/dL — ABNORMAL HIGH (ref 8–23)
CO2: 20 mmol/L — ABNORMAL LOW (ref 22–32)
Calcium: 10.4 mg/dL — ABNORMAL HIGH (ref 8.9–10.3)
Chloride: 109 mmol/L (ref 98–111)
Creatinine, Ser: 2.16 mg/dL — ABNORMAL HIGH (ref 0.44–1.00)
GFR, Estimated: 23 mL/min — ABNORMAL LOW (ref >60)
Glucose, Bld: 136 mg/dL — ABNORMAL HIGH (ref 70–99)
Phosphorus: 4.4 mg/dL (ref 2.5–4.6)
Potassium: 4 mmol/L (ref 3.5–5.1)
Sodium: 139 mmol/L (ref 135–145)

## 2022-02-19 LAB — URINALYSIS, DIPSTICK ONLY
Bilirubin Urine: NEGATIVE
Glucose, UA: NEGATIVE mg/dL
Hgb urine dipstick: NEGATIVE
Ketones, ur: NEGATIVE mg/dL
Nitrite: NEGATIVE
Protein, ur: 30 mg/dL — AB
Specific Gravity, Urine: 1.014 (ref 1.005–1.030)
pH: 5 (ref 5.0–8.0)

## 2022-02-19 LAB — TSH: TSH: 4.132 u[IU]/mL (ref 0.350–4.500)

## 2022-02-19 LAB — PROTEIN / CREATININE RATIO, URINE
Creatinine, Urine: 161 mg/dL
Protein Creatinine Ratio: 0.27 mg/mg{Cre} — ABNORMAL HIGH (ref 0.00–0.15)
Total Protein, Urine: 44 mg/dL

## 2022-02-19 MED ORDER — SODIUM CHLORIDE 0.9 % IV SOLN
200.0000 mg | Freq: Once | INTRAVENOUS | Status: AC
  Administered 2022-02-19: 200 mg via INTRAVENOUS
  Filled 2022-02-19: qty 200

## 2022-02-19 MED ORDER — SODIUM CHLORIDE 0.9 % IV SOLN
Freq: Once | INTRAVENOUS | Status: AC
  Filled 2022-02-19: qty 250

## 2022-02-19 NOTE — Assessment & Plan Note (Addendum)
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  Tolerating [name] fairly well except for joint pain see below.  #Proceed with [name] #4 today. Labs today reviewed;  acceptable for treatment today.   #Anemia: Hemoglobin around 10 overall stable/improving.   #Short-term memory deficit as per patient: No clinical suspicion for metastatic disease.  Question related to poor sleep quality at night/because of frequent urination.  Recommend stop drinking fluids after 7:00 in the evening.  # Bil Knee pain:?  Secondary to [name] s/p steroids  X 4-5 days- resolved; monitor closely.  #CKD stage IV- GFR ~24; discussed re: avoiding nephrotoxic agents;  Continue enough hydration. Monitor closely. [facility] Nephrology evaluation on June 1st]; unlikley any significant toxicity from [place].   # HTN- Improved; discussed re: avoiding nephrotoxic agents-STABLE.    # DISPOSITION: # TSH add # [name] today # follow up in 3 weeks- MD; labs- cbc/cmp; [name] - Dr.B

## 2022-02-19 NOTE — Progress Notes (Unsigned)
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  Histologic Grade (WHO / ISUP Grade): G3, nucleoli conspicuous and  eosinophilic at 188 X magnification  Tumor Extent: Extends into major vein (segmental branch adjacent to  renal sinus)  Sarcomatoid Features: Not identified  Rhabdoid Features: Not identified  Tumor Necrosis: Present, microscopic and macroscopic, 20%   MARGINS  Margin Status: All margins negative for invasive carcinoma   REGIONAL LYMPH NODES  Regional Lymph Node Status: Not applicable (no regional lymph nodes  submitted or found)   DISTANT METASTASIS  Distant Site(s) Involved, if applicable: Not applicable   PATHOLOGIC STAGE CLASSIFICATION (pTNM, AJCC 8th Edition):  TNM Descriptors: Applicable  CZ6S  pN - Not assigned (no lymph nodes submitted or found)  pM - Not applicable   # AY3K- stage III; G-3; no sarcomatoid features [right nephrectomy- [facility]].  # FEB 14th, 2023- CT AP [[facility]; [facility]]- . Large mass exophytic extending from lower to mid pole of the RIGHT kidney consistent with renal neoplasm. [Dr.Sninski; [facility]]  # FEB 2023- Right lower lobe subpleural 4 mm lung nodule; bone survey negative  #April 7th, 2023- On adjuvant [name]    # CKD III [ sec to HTN > 20 years; none now]; Anemia- EGD/colo- 2023- [[facility]]; [facility] [No CAD]   Clear cell renal cell carcinoma, right ([facility])  12/12/2021 Initial Diagnosis   Clear cell renal cell carcinoma, right ([facility])   12/18/2021 -  Chemotherapy   Patient is on Treatment Plan : RENAL CELL Pembrolizumab (200) q21d     01/08/2022 Cancer Staging   Staging form: Kidney, AJCC 8th  Edition - Pathologic: Stage III (pT3a, pN0, cM0) - Signed by Gabrielle Sickle, MD on 01/08/2022    HISTORY OF PRESENTING ILLNESS: Ambulating independently.  Alone.   [facility] 75 y.o.  female with history of stage III kidney cancer; CKD stage III currently on adjuvant Gabrielle Savage is here for follow-up.    Patient denies new problems/concerns today.  ..   Complains of memory- more short term issues- more so in last 3 week. Poor sleep quality sec to going to urinate multiple.  Denies any headaches.  Denies any difficulty talking.  Denies any blood in stools or black-colored stools.  Denies any diarrhea.  Review of Systems  Constitutional:  Positive for malaise/fatigue and weight loss. Negative for chills and fever.  HENT:  Negative for nosebleeds and sore throat.   Eyes:  Negative for double vision.  Respiratory:  Positive for shortness of breath. Negative for cough, hemoptysis, sputum production and wheezing.   Cardiovascular:  Negative for chest pain, palpitations, orthopnea and leg swelling.  Gastrointestinal:  Negative for abdominal pain, blood in stool, constipation, diarrhea, heartburn, melena, nausea and vomiting.  Genitourinary:  Negative for dysuria, frequency and urgency.  Musculoskeletal:  Negative for back pain and joint pain.  Skin: Negative.  Negative for itching and rash.  Neurological:  Negative for dizziness, tingling, focal weakness, weakness and headaches.  Endo/Heme/Allergies:  Does not bruise/bleed easily.  Psychiatric/Behavioral:  Negative for depression. The patient is not nervous/anxious and does not have insomnia.      MEDICAL HISTORY:  Past Medical History:  Diagnosis Date   Aortic atherosclerosis (  [facility])    Arthritis    B12 deficiency    Bradycardia    CAD (coronary artery disease)    Carotid atherosclerosis    CKD (chronic kidney disease), stage III ([facility])    Diastolic dysfunction 95/05/3266   a.)  TTE 09/03/2021: EF 63%; no RWMAs; LA mildly  enlarged; trivial TR/PR, mild AR, moderate MR; G1DD.   DOE (dyspnea on exertion)    Fatigue    Gout    HLD (hyperlipidemia)    Hypertension    IDA (iron deficiency anemia)    Iliotibial band syndrome, left leg    MDD (major depressive disorder)    OAB (overactive bladder)    Obesity    PAC (premature atrial contraction)    noted on Holter   PONV (postoperative nausea and vomiting)    a.) single episode in 03/2013   PVC (premature ventricular contraction)    noted on Holter   Right kidney mass 10/27/2021   a.) CT 10/27/2021 -- mass measuring 8.3 x 6.8 x 8.1 cm   Sensorineural hearing loss (SNHL) of both ears    SUI (stress urinary incontinence, female)    SVT (supraventricular tachycardia) ([facility])    noted on Holter   T2DM (type 2 diabetes mellitus) (Gab[facility]elle Savage)    Uterovaginal prolapse    Vitamin D deficiency    Voiding dysfunction     SURGICAL HISTORY: Past Surgical History:  Procedure Laterality Date   ABDOMINAL HYSTERECTOMY     partial   BLADDER SUSPENSION     CHOLECYSTECTOMY     COLONOSCOPY N/A 07/25/2017   Procedure: COLONOSCOPY;  Surgeon: Gabrielle Sails, MD;  [facility];  Service: Endoscopy;  Laterality: N/A;   COLONOSCOPY WITH PROPOFOL N/A 10/19/2021   Procedure: COLONOSCOPY WITH PROPOFOL;  Surgeon: Gabrielle Rubenstein, MD;  Location: [facility];  Service: Endoscopy;  Laterality: N/A;  DM   ESOPHAGOGASTRODUODENOSCOPY (EGD) WITH PROPOFOL N/A 10/19/2021   Procedure: ESOPHAGOGASTRODUODENOSCOPY (EGD) WITH PROPOFOL;  Surgeon: Gabrielle Rubenstein, MD;  Location: [facility];  Service: Endoscopy;  Laterality: N/A;   JOINT REPLACEMENT Bilateral    PARTIAL KNEE REPLACEMENT   KNEE SURGERY     LAPAROSCOPIC NEPHRECTOMY, HAND ASSISTED Right 11/20/2021   Procedure: HAND ASSISTED LAPAROSCOPIC RADICAL NEPHRECTOMY;  Surgeon: Gabrielle Co, MD;  Location: [facility];  Service: Urology;  Laterality: Right;   PARATHYROIDECTOMY      SOCIAL HISTORY: Social History    Socioeconomic History   Marital status: Married    Spouse name: Not on file   Number of children: Not on file   Years of education: Not on file   Highest education level: Not on file  Occupational History   Not on file  Tobacco Use   Smoking status: Never    Passive exposure: Never   Smokeless tobacco: Never  Vaping Use   Vaping Use: Never used  Substance and Sexual Activity   Alcohol use: Not Currently    Comment: occ   Drug use: No   Sexual activity: Not Currently  Other Topics Concern   Not on file  Social History Narrative   Lives in [place] - close hospital; with husband; 3 biological children [[place]; [place]; [place]]. Never smoked; glass of wine rare. 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    Social Determinants of Health   Financial Resource Strain: Low Risk  (12/07/2021)   Overall Financial Resource Strain (CARDIA)    Difficulty of Paying Living Expenses: Not hard at all  Food Insecurity: No Food Insecurity (12/07/2021)  Hunger Vital Sign    Worried About Running Out of Food in the Last Year: Never true    Ran Out of Food in the Last Year: Never true  Transportation Needs: No Transportation Needs (12/07/2021)   PRAPARE - Hydrologist (Medical): No    Lack of Transportation (Non-Medical): No  Physical Activity: Insufficiently Active (12/07/2021)   Exercise Vital Sign    Days of Exercise per Week: 2 days    Minutes of Exercise per Session: 30 min  Stress: No Stress Concern Present (12/07/2021)   [place]    Feeling of Stress : Only a little  Social Connections: Moderately Integrated (12/07/2021)   Social Connection and Isolation Panel [NHANES]    Frequency of Communication with Friends and Family: Three times a week    Frequency of Social Gatherings with Friends and Family: Three times a week    Attends Religious Services: 1 to 4 times per year    Active Member of  Clubs or Organizations: No    Attends Archivist Meetings: Never    Marital Status: Married  Human resources officer Violence: Not At Risk (12/07/2021)   Humiliation, Afraid, Rape, and Kick questionnaire    Fear of Current or Ex-Partner: No    Emotionally Abused: No    Physically Abused: No    Sexually Abused: No    FAMILY HISTORY: Family History  Problem Relation Age of Onset   Heart failure Mother    Stroke Father    Lung cancer Father    Breast cancer Neg Hx     ALLERGIES:  is allergic to codeine, nsaids, and oxycodone.  MEDICATIONS:  Current Outpatient Medications  Medication Sig Dispense Refill   allopurinol (ZYLOPRIM) 100 MG tablet Take 300 mg by mouth every morning. Pt has decreased to '100mg'$      amLODipine (NORVASC) 5 MG tablet Take 5 mg by mouth every morning.     citalopram (CELEXA) 10 MG tablet Take 10 mg by mouth every morning.     docusate sodium (COLACE) 100 MG capsule Take 1 capsule (100 mg total) by mouth 2 (two) times daily. 20 capsule 0   ergocalciferol (VITAMIN D2) 50000 units capsule Take 50,000 Units by mouth every Saturday.     ferrous sulfate 325 (65 FE) MG tablet Take 325 mg by mouth every other day.     irbesartan (AVAPRO) 150 MG tablet Take 150 mg by mouth every morning.     ondansetron (ZOFRAN) 8 MG tablet Take 1 tablet (8 mg total) by mouth 2 (two) times daily as needed (Nausea or vomiting). 30 tablet 1   prochlorperazine (COMPAZINE) 10 MG tablet Take 1 tablet (10 mg total) by mouth every 6 (six) hours as needed (Nausea or vomiting). 30 tablet 1   vitamin B-12 (CYANOCOBALAMIN) 1000 MCG tablet Take 1,000 mcg by mouth daily.     metFORMIN (GLUCOPHAGE) 500 MG tablet Take 500 mg by mouth every morning.     No current facility-administered medications for this visit.      Marland Kitchen  PHYSICAL EXAMINATION:  Vitals:   02/19/22 0800  BP: 116/60  Pulse: 67  Resp: 16  Temp: 99.1 F (37.3 C)   Filed Weights   02/19/22 0800  Weight: 171 lb 6.4 oz (77.7  kg)    Physical Exam Vitals and nursing note reviewed.  HENT:     Head: Normocephalic and atraumatic.     Mouth/Throat:  Pharynx: Oropharynx is clear.  Eyes:     Extraocular Movements: Extraocular movements intact.     Pupils: Pupils are equal, round, and reactive to light.  Cardiovascular:     Rate and Rhythm: Normal rate and regular rhythm.  Pulmonary:     Comments: Decreased breath sounds bilaterally.  Abdominal:     Palpations: Abdomen is soft.  Musculoskeletal:        General: Normal range of motion.     Cervical back: Normal range of motion.  Skin:    General: Skin is warm.  Neurological:     General: No focal deficit present.     Mental Status: She is alert and oriented to person, place, and time.  Psychiatric:        Behavior: Behavior normal.        Judgment: Judgment normal.      LABORATORY DATA:  I have reviewed the data as listed Lab Results  Component Value Date   WBC 5.9 02/19/2022   HGB 11.5 (L) 02/19/2022   HCT 36.7 02/19/2022   MCV 92.7 02/19/2022   PLT 160 02/19/2022   Recent Labs    01/08/22 0832 01/29/22 0828 02/19/22 0824  NA 137 136 138  K 4.2 4.8 4.1  CL 111 108 109  CO2 19* 20* 20*  GLUCOSE 129* 127* 138*  BUN 52* 68* 63*  CREATININE 2.03* 2.35* 2.16*  CALCIUM 9.6 10.2 10.5*  GFRNONAA 25* 21* 23*  PROT 7.5 7.2 7.2  ALBUMIN 4.2 4.1 4.2  AST 11* 11* 13*  ALT '17 8 10  '$ ALKPHOS 120 118 94  BILITOT 0.5 0.4 0.5     RADIOGRAPHIC STUDIES: I have personally reviewed the radiological images as listed and agreed with the findings in the report. No results found.  No problem-specific Assessment & Plan notes found for this encounter.   All questions were answered. The patient knows to call the clinic with any problems, questions or concerns.     Gabrielle Sickle, MD 02/19/2022 9:02 AM

## 2022-02-19 NOTE — Patient Instructions (Signed)
[facility]  Discharge Instructions: ?Thank you for choosing [facility] to provide your oncology and hematology care.  ?If you have a lab appointment with the Cancer Center, please go directly to the Cancer Center and check in at the registration area. ? ?Wear comfortable clothing and clothing appropriate for easy access to any Portacath or PICC line.  ? ?We strive to give you quality time with your provider. You may need to reschedule your appointment if you arrive late (15 or more minutes).  Arriving late affects you and other patients whose appointments are after yours.  Also, if you miss three or more appointments without notifying the office, you may be dismissed from the clinic at the provider?s discretion.    ?  ?For prescription refill requests, have your pharmacy contact our office and allow 72 hours for refills to be completed.   ? ?Today you received the following chemotherapy and/or immunotherapy agents: [name]   ?  ?To help prevent nausea and vomiting after your treatment, we encourage you to take your nausea medication as directed. ? ?BELOW ARE SYMPTOMS THAT SHOULD BE REPORTED IMMEDIATELY: ?*FEVER GREATER THAN 100.4 F (38 ?C) OR HIGHER ?*CHILLS OR SWEATING ?*NAUSEA AND VOMITING THAT IS NOT CONTROLLED WITH YOUR NAUSEA MEDICATION ?*UNUSUAL SHORTNESS OF BREATH ?*UNUSUAL BRUISING OR BLEEDING ?*URINARY PROBLEMS (pain or burning when urinating, or frequent urination) ?*BOWEL PROBLEMS (unusual diarrhea, constipation, pain near the anus) ?TENDERNESS IN MOUTH AND THROAT WITH OR WITHOUT PRESENCE OF ULCERS (sore throat, sores in mouth, or a toothache) ?UNUSUAL RASH, SWELLING OR PAIN  ?UNUSUAL VAGINAL DISCHARGE OR ITCHING  ? ?Items with * indicate a potential emergency and should be followed up as soon as possible or go to the Emergency Department if any problems should occur. ? ?Please show the CHEMOTHERAPY ALERT CARD or IMMUNOTHERAPY ALERT CARD at check-in to  the Emergency Department and triage nurse. ? ?Should you have questions after your visit or need to cancel or reschedule your appointment, please contact [facility]  [phone] and follow the prompts.  Office hours are 8:00 a.m. to 4:30 p.m. Monday - Friday. Please note that voicemails left after 4:00 p.m. may not be returned until the following business day.  We are closed weekends and major holidays. You have access to a nurse at all times for urgent questions. Please call the main number to the clinic [phone] and follow the prompts. ? ?For any non-urgent questions, you may also contact your provider using MyChart. We now offer e-Visits for anyone 18 and older to request care online for non-urgent symptoms. For details visit [email or web address]. ?  ?Also download the MyChart app! Go to the app store, search "MyChart", open the app, select [facility], and log in with your MyChart username and password. ? ?Due to Covid, a mask is required upon entering the hospital/clinic. If you do not have a mask, one will be given to you upon arrival. For doctor visits, patients may have 1 support person aged 18 or older with them. For treatment visits, patients cannot have anyone with them due to current Covid guidelines and our immunocompromised population.  ?

## 2022-02-19 NOTE — Progress Notes (Signed)
Nutrition Follow-up:  Patient with renal cell carcinoma.  Patient receiving [place].    Met with patient during infusion.  Patient reports that her appetite is decreased.  Reports that she is eating greek yogurt with blueberries for breakfast some mornings or oatmeal and fruit or wasa crackers or eggs and wheat toast.  Has spaghetti and meatballs last night for dinner (homemade sauce that dtr prepared).  Wants to try Mrs Deliah Boston seasoning    Medications: reviewed  Labs: glucose 136, BUN 63, creatinine 2.16, calcium 10/4  Anthropometrics:   Weight 165 lb today  173 lb on 4/28 170 lb on 2/6 199 lb on 6/14   NUTRITION DIAGNOSIS: Food and nutrition related knowledge deficit improving    INTERVENTION:  Reviewed eating strategies for healthy eating to protect kidneys If weight continues to decrease and appetite remains low would encourage adding oral nutrition supplement    MONITORING, EVALUATION, GOAL: weight trends, intake   NEXT VISIT: ~ 6 weeks during treatment  [facility] B. Zenia Resides, [place], [place] Registered Dietitian [phone]

## 2022-02-19 NOTE — Progress Notes (Signed)
All labs reviewed. OK to proceed with [name] today per Dr. Jacinto Reap.

## 2022-02-19 NOTE — Progress Notes (Unsigned)
Patient denies new problems/concerns today.   °

## 2022-02-19 NOTE — Progress Notes (Signed)
written orders from Dr. Lanora Manis at [facility] brought in by patient

## 2022-02-20 LAB — PARATHYROID HORMONE, INTACT (NO CA): PTH: 17 pg/mL (ref 15–65)

## 2022-03-12 ENCOUNTER — Ambulatory Visit: Payer: Medicare Other | Admitting: Internal Medicine

## 2022-03-12 ENCOUNTER — Other Ambulatory Visit: Payer: Medicare Other

## 2022-03-12 ENCOUNTER — Ambulatory Visit: Payer: Medicare Other

## 2022-03-15 ENCOUNTER — Inpatient Hospital Stay (HOSPITAL_BASED_OUTPATIENT_CLINIC_OR_DEPARTMENT_OTHER): Payer: Medicare Other | Admitting: Internal Medicine

## 2022-03-15 ENCOUNTER — Inpatient Hospital Stay: Payer: Medicare Other

## 2022-03-15 ENCOUNTER — Encounter: Payer: Self-pay | Admitting: Internal Medicine

## 2022-03-15 ENCOUNTER — Inpatient Hospital Stay: Payer: Medicare Other | Attending: Internal Medicine

## 2022-03-15 VITALS — BP 136/62 | HR 58 | Temp 98.7°F | Ht 62.0 in | Wt 174.6 lb

## 2022-03-15 DIAGNOSIS — Z5112 Encounter for antineoplastic immunotherapy: Secondary | ICD-10-CM | POA: Insufficient documentation

## 2022-03-15 DIAGNOSIS — C641 Malignant neoplasm of right kidney, except renal pelvis: Secondary | ICD-10-CM

## 2022-03-15 DIAGNOSIS — Z79899 Other long term (current) drug therapy: Secondary | ICD-10-CM | POA: Diagnosis not present

## 2022-03-15 DIAGNOSIS — R5383 Other fatigue: Secondary | ICD-10-CM

## 2022-03-15 LAB — CBC WITH DIFFERENTIAL/PLATELET
Abs Immature Granulocytes: 0.01 10*3/uL (ref 0.00–0.07)
Basophils Absolute: 0 10*3/uL (ref 0.0–0.1)
Basophils Relative: 1 %
Eosinophils Absolute: 0.2 10*3/uL (ref 0.0–0.5)
Eosinophils Relative: 4 %
HCT: 35.9 % — ABNORMAL LOW (ref 36.0–46.0)
Hemoglobin: 11.2 g/dL — ABNORMAL LOW (ref 12.0–15.0)
Immature Granulocytes: 0 %
Lymphocytes Relative: 31 %
Lymphs Abs: 1.7 10*3/uL (ref 0.7–4.0)
MCH: 29.3 pg (ref 26.0–34.0)
MCHC: 31.2 g/dL (ref 30.0–36.0)
MCV: 94 fL (ref 80.0–100.0)
Monocytes Absolute: 0.6 10*3/uL (ref 0.1–1.0)
Monocytes Relative: 11 %
Neutro Abs: 3.1 10*3/uL (ref 1.7–7.7)
Neutrophils Relative %: 53 %
Platelets: 149 10*3/uL — ABNORMAL LOW (ref 150–400)
RBC: 3.82 MIL/uL — ABNORMAL LOW (ref 3.87–5.11)
RDW: 16 % — ABNORMAL HIGH (ref 11.5–15.5)
WBC: 5.7 10*3/uL (ref 4.0–10.5)
nRBC: 0 % (ref 0.0–0.2)

## 2022-03-15 LAB — COMPREHENSIVE METABOLIC PANEL
ALT: 9 U/L (ref 0–44)
AST: 12 U/L — ABNORMAL LOW (ref 15–41)
Albumin: 3.8 g/dL (ref 3.5–5.0)
Alkaline Phosphatase: 86 U/L (ref 38–126)
Anion gap: 7 (ref 5–15)
BUN: 64 mg/dL — ABNORMAL HIGH (ref 8–23)
CO2: 19 mmol/L — ABNORMAL LOW (ref 22–32)
Calcium: 9.6 mg/dL (ref 8.9–10.3)
Chloride: 114 mmol/L — ABNORMAL HIGH (ref 98–111)
Creatinine, Ser: 2.36 mg/dL — ABNORMAL HIGH (ref 0.44–1.00)
GFR, Estimated: 21 mL/min — ABNORMAL LOW (ref >60)
Glucose, Bld: 119 mg/dL — ABNORMAL HIGH (ref 70–99)
Potassium: 4.4 mmol/L (ref 3.5–5.1)
Sodium: 140 mmol/L (ref 135–145)
Total Bilirubin: 0.5 mg/dL (ref 0.3–1.2)
Total Protein: 6.4 g/dL — ABNORMAL LOW (ref 6.5–8.1)

## 2022-03-15 MED ORDER — SODIUM CHLORIDE 0.9 % IV SOLN
Freq: Once | INTRAVENOUS | Status: AC
  Filled 2022-03-15: qty 250

## 2022-03-15 MED ORDER — SODIUM CHLORIDE 0.9 % IV SOLN
200.0000 mg | Freq: Once | INTRAVENOUS | Status: AC
  Administered 2022-03-15: 200 mg via INTRAVENOUS
  Filled 2022-03-15: qty 8

## 2022-03-15 NOTE — Assessment & Plan Note (Addendum)
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  Tolerating [name] fairly well except for joint pain/fatigue.   #Proceed with [name] #5 today. Labs today reviewed;  acceptable for treatment today.   #Anemia: Hemoglobin around 10 overall stable/improving; on PO iron every other day.    #Fatigue: Likely from [facility] for now.  However no concerns for endocrinopathy JUNE 2023 -TSH WNL    # Bil Knee pain:?  Secondary to [name] s/p steroids  X 4-5 days- resolved; monitor closely- STABLE.   #CKD stage IV- GFR ~21; Continue enough hydration. Monitor closely. [s/p [facility] evaluation on June 1st]- STABLE.   # HTN- Improved; discussed re: avoiding nephrotoxic agents-STABLE.   #Diabetes: Blood sugars 116; patient on metformin on glimepiride [as per nephrology]  # IV ACCESS: PIV.    # DISPOSITION: # [name] today # follow up in 3 weeks- X-MD; labs- cbc/cmp; [name]  # follow up in 6 weeks- MD; labs- cbc/cmp;TSH-  [name]- Dr.B

## 2022-03-15 NOTE — Progress Notes (Signed)
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  Histologic Grade (WHO / ISUP Grade): G3, nucleoli conspicuous and  eosinophilic at 852 X magnification  Tumor Extent: Extends into major vein (segmental branch adjacent to  renal sinus)  Sarcomatoid Features: Not identified  Rhabdoid Features: Not identified  Tumor Necrosis: Present, microscopic and macroscopic, 20%   MARGINS  Margin Status: All margins negative for invasive carcinoma   REGIONAL LYMPH NODES  Regional Lymph Node Status: Not applicable (no regional lymph nodes  submitted or found)   DISTANT METASTASIS  Distant Site(s) Involved, if applicable: Not applicable   PATHOLOGIC STAGE CLASSIFICATION (pTNM, AJCC 8th Edition):  TNM Descriptors: Applicable  [id number]  pN - Not assigned (no lymph nodes submitted or found)  pM - Not applicable   # UM3N- stage III; G-3; no sarcomatoid features [right nephrectomy- Dr.Sninksi].  # FEB 14th, 2023- CT AP [GI; [facility]]- . Large mass exophytic extending from lower to mid pole of the RIGHT kidney consistent with renal neoplasm. [Dr.Sninski; Uorlogy]  # FEB 2023- Right lower lobe subpleural 4 mm lung nodule; bone survey negative  #April 7th, 2023- On adjuvant [name]    # CKD III [ sec to HTN > 20 years; none now]; Anemia- EGD/colo- 2023- [[facility]]; Dr.Kowalski [No CAD]   Clear cell renal cell carcinoma, right ([place])  12/12/2021 Initial Diagnosis   Clear cell renal cell carcinoma, right ([place])   12/18/2021 -  Chemotherapy   Patient is on Treatment Plan : RENAL CELL Pembrolizumab (200) q21d     01/08/2022 Cancer Staging   Staging form: Kidney, AJCC 8th  Edition - Pathologic: Stage III (pT3a, pN0, cM0) - Signed by Cammie Sickle, MD on 01/08/2022    HISTORY OF PRESENTING ILLNESS: Ambulating independently.    Gabrielle Savage 76 y.o.  female with history of stage III kidney cancer; CKD stage III currently on adjuvant Beryle Flock is here for follow-up.    Patient is fatigued.  Denies any worsening shortness of breath or cough.  Denies any worsening joint pains.  No nausea no vomiting.  Review of Systems  Constitutional:  Positive for malaise/fatigue. Negative for chills and fever.  HENT:  Negative for nosebleeds and sore throat.   Eyes:  Negative for double vision.  Respiratory:  Negative for cough, hemoptysis, sputum production and wheezing.   Cardiovascular:  Negative for chest pain, palpitations, orthopnea and leg swelling.  Gastrointestinal:  Negative for abdominal pain, blood in stool, constipation, diarrhea, heartburn, melena, nausea and vomiting.  Genitourinary:  Negative for dysuria, frequency and urgency.  Musculoskeletal:  Positive for back pain and joint pain.  Skin: Negative.  Negative for itching and rash.  Neurological:  Negative for dizziness, tingling, focal weakness, weakness and headaches.  Endo/Heme/Allergies:  Does not bruise/bleed easily.  Psychiatric/Behavioral:  Negative for depression. The patient is not nervous/anxious and does not have insomnia.      MEDICAL HISTORY:  Past Medical History:  Diagnosis Date   Aortic atherosclerosis (HCC)    Arthritis    B12 deficiency    Bradycardia    CAD (coronary artery disease)    Carotid atherosclerosis    CKD (chronic kidney disease), stage III ([place])  Diastolic dysfunction 99/37/1696   a.)  TTE 09/03/2021: EF 63%; no RWMAs; LA mildly enlarged; trivial TR/PR, mild AR, moderate MR; G1DD.   DOE (dyspnea on exertion)    Fatigue    Gout    HLD (hyperlipidemia)    Hypertension    IDA (iron deficiency anemia)    Iliotibial band syndrome, left leg    MDD (major  depressive disorder)    OAB (overactive bladder)    Obesity    PAC (premature atrial contraction)    noted on Holter   PONV (postoperative nausea and vomiting)    a.) single episode in 03/2013   PVC (premature ventricular contraction)    noted on Holter   Right kidney mass 10/27/2021   a.) CT 10/27/2021 -- mass measuring 8.3 x 6.8 x 8.1 cm   Sensorineural hearing loss (SNHL) of both ears    SUI (stress urinary incontinence, female)    SVT (supraventricular tachycardia) ([place])    noted on Holter   T2DM (type 2 diabetes mellitus) ([place])    Uterovaginal prolapse    Vitamin D deficiency    Voiding dysfunction     SURGICAL HISTORY: Past Surgical History:  Procedure Laterality Date   ABDOMINAL HYSTERECTOMY     partial   BLADDER SUSPENSION     CHOLECYSTECTOMY     COLONOSCOPY N/A 07/25/2017   Procedure: COLONOSCOPY;  Surgeon: [name], MD;  Location: [facility] ENDOSCOPY;  Service: Endoscopy;  Laterality: N/A;   COLONOSCOPY WITH PROPOFOL N/A 10/19/2021   Procedure: COLONOSCOPY WITH PROPOFOL;  Surgeon: [name], MD;  Location: [facility] ENDOSCOPY;  Service: Endoscopy;  Laterality: N/A;  DM   ESOPHAGOGASTRODUODENOSCOPY (EGD) WITH PROPOFOL N/A 10/19/2021   Procedure: ESOPHAGOGASTRODUODENOSCOPY (EGD) WITH PROPOFOL;  Surgeon: [name], MD;  Location: [facility] ENDOSCOPY;  Service: Endoscopy;  Laterality: N/A;   JOINT REPLACEMENT Bilateral    PARTIAL KNEE REPLACEMENT   KNEE SURGERY     LAPAROSCOPIC NEPHRECTOMY, HAND ASSISTED Right 11/20/2021   Procedure: HAND ASSISTED LAPAROSCOPIC RADICAL NEPHRECTOMY;  Surgeon: Billey Co, MD;  Location: [facility] ORS;  Service: Urology;  Laterality: Right;   PARATHYROIDECTOMY      SOCIAL HISTORY: Social History   Socioeconomic History   Marital status: Married    Spouse name: Not on file   Number of children: Not on file   Years of education: Not on file   Highest education level: Not on file  Occupational History   Not on file   Tobacco Use   Smoking status: Never    Passive exposure: Never   Smokeless tobacco: Never  Vaping Use   Vaping Use: Never used  Substance and Sexual Activity   Alcohol use: Not Currently    Comment: occ   Drug use: No   Sexual activity: Not Currently  Other Topics Concern   Not on file  Social History Narrative   Lives in [place] - close hospital; with husband; 3 biological children [[place]; [place]; [place]]. Never smoked; glass of wine rare. 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    Social Determinants of Health   Financial Resource Strain: Low Risk  (12/07/2021)   Overall Financial Resource Strain (CARDIA)    Difficulty of Paying Living Expenses: Not hard at all  Food Insecurity: No Food Insecurity (12/07/2021)   Hunger Vital Sign    Worried About Running Out of Food in the Last Year: Never true    Ran Out of Food in the Last Year: Never true  Transportation Needs: No Transportation Needs (  12/07/2021)   PRAPARE - Hydrologist (Medical): No    Lack of Transportation (Non-Medical): No  Physical Activity: Insufficiently Active (12/07/2021)   Exercise Vital Sign    Days of Exercise per Week: 2 days    Minutes of Exercise per Session: 30 min  Stress: No Stress Concern Present (12/07/2021)   [place]    Feeling of Stress : Only a little  Social Connections: Moderately Integrated (12/07/2021)   Social Connection and Isolation Panel [NHANES]    Frequency of Communication with Friends and Family: Three times a week    Frequency of Social Gatherings with Friends and Family: Three times a week    Attends Religious Services: 1 to 4 times per year    Active Member of Clubs or Organizations: No    Attends Archivist Meetings: Never    Marital Status: Married  Human resources officer Violence: Not At Risk (12/07/2021)   Humiliation, Afraid, Rape, and Kick questionnaire    Fear of Current  or Ex-Partner: No    Emotionally Abused: No    Physically Abused: No    Sexually Abused: No    FAMILY HISTORY: Family History  Problem Relation Age of Onset   Heart failure Mother    Stroke Father    Lung cancer Father    Breast cancer Neg Hx     ALLERGIES:  is allergic to codeine, nsaids, and oxycodone.  MEDICATIONS:  Current Outpatient Medications  Medication Sig Dispense Refill   allopurinol (ZYLOPRIM) 100 MG tablet Take 300 mg by mouth every morning. Pt has decreased to '100mg'$      amLODipine (NORVASC) 5 MG tablet Take 5 mg by mouth every morning.     citalopram (CELEXA) 10 MG tablet Take 10 mg by mouth every morning.     docusate sodium (COLACE) 100 MG capsule Take 1 capsule (100 mg total) by mouth 2 (two) times daily. 20 capsule 0   ergocalciferol (VITAMIN D2) 50000 units capsule Take 50,000 Units by mouth every Saturday.     ferrous sulfate 325 (65 FE) MG tablet Take 325 mg by mouth every other day.     irbesartan (AVAPRO) 150 MG tablet Take 150 mg by mouth every morning.     metFORMIN (GLUCOPHAGE) 500 MG tablet Take 500 mg by mouth every morning.     ondansetron (ZOFRAN) 8 MG tablet Take 1 tablet (8 mg total) by mouth 2 (two) times daily as needed (Nausea or vomiting). 30 tablet 1   prochlorperazine (COMPAZINE) 10 MG tablet Take 1 tablet (10 mg total) by mouth every 6 (six) hours as needed (Nausea or vomiting). 30 tablet 1   vitamin B-12 (CYANOCOBALAMIN) 1000 MCG tablet Take 1,000 mcg by mouth daily.     No current facility-administered medications for this visit.   Facility-Administered Medications Ordered in Other Visits  Medication Dose Route Frequency Provider Last Rate Last Admin   pembrolizumab ([name]) 200 mg in sodium chloride 0.9 % 50 mL chemo infusion  200 mg Intravenous Once Cammie Sickle, MD          .  PHYSICAL EXAMINATION:  Vitals:   03/15/22 0834  BP: 136/62  Pulse: (!) 58  Temp: 98.7 F (37.1 C)  SpO2: 100%   Filed Weights    03/15/22 0834  Weight: 174 lb 9.6 oz (79.2 kg)    Physical Exam Vitals and nursing note reviewed.  HENT:     Head: Normocephalic  and atraumatic.     Mouth/Throat:     Pharynx: Oropharynx is clear.  Eyes:     Extraocular Movements: Extraocular movements intact.     Pupils: Pupils are equal, round, and reactive to light.  Cardiovascular:     Rate and Rhythm: Normal rate and regular rhythm.  Pulmonary:     Comments: Decreased breath sounds bilaterally.  Abdominal:     Palpations: Abdomen is soft.  Musculoskeletal:        General: Normal range of motion.     Cervical back: Normal range of motion.  Skin:    General: Skin is warm.  Neurological:     General: No focal deficit present.     Mental Status: She is alert and oriented to person, place, and time.  Psychiatric:        Behavior: Behavior normal.        Judgment: Judgment normal.      LABORATORY DATA:  I have reviewed the data as listed Lab Results  Component Value Date   WBC 5.7 03/15/2022   HGB 11.2 (L) 03/15/2022   HCT 35.9 (L) 03/15/2022   MCV 94.0 03/15/2022   PLT 149 (L) 03/15/2022   Recent Labs    01/29/22 0828 02/19/22 0824 02/19/22 0905 03/15/22 0819  NA 136 138 139 140  K 4.8 4.1 4.0 4.4  CL 108 109 109 114*  CO2 20* 20* 20* 19*  GLUCOSE 127* 138* 136* 119*  BUN 68* 63* 63* 64*  CREATININE 2.35* 2.16* 2.16* 2.36*  CALCIUM 10.2 10.5* 10.4* 9.6  GFRNONAA 21* 23* 23* 21*  PROT 7.2 7.2  --  6.4*  ALBUMIN 4.1 4.2 4.2 3.8  AST 11* 13*  --  12*  ALT 8 10  --  9  ALKPHOS 118 94  --  86  BILITOT 0.4 0.5  --  0.5    RADIOGRAPHIC STUDIES: I have personally reviewed the radiological images as listed and agreed with the findings in the report. No results found.  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  Tolerating [name] fairly well except for joint pain/fatigue.   #Proceed with [name] #5 today. Labs today reviewed;  acceptable  for treatment today.   #Anemia: Hemoglobin around 10 overall stable/improving; on PO iron every other day.    #Fatigue: Likely from [facility] for now.  However no concerns for endocrinopathy JUNE 2023 -TSH WNL    # Bil Knee pain:?  Secondary to [name] s/p steroids  X 4-5 days- resolved; monitor closely- STABLE.   #CKD stage IV- GFR ~21; Continue enough hydration. Monitor closely. [s/p [facility] evaluation on June 1st]- STABLE.   # HTN- Improved; discussed re: avoiding nephrotoxic agents-STABLE.   #Diabetes: Blood sugars 116; patient on metformin on glimepiride [as per nephrology]  # IV ACCESS: PIV.    # DISPOSITION: # [name] today # follow up in 3 weeks- X-MD; labs- cbc/cmp; [name]  # follow up in 6 weeks- MD; labs- cbc/cmp;TSH-  [name]- Dr.B    All questions were answered. The patient knows to call the clinic with any problems, questions or concerns.     Cammie Sickle, MD 03/15/2022 9:14 AM

## 2022-04-05 ENCOUNTER — Inpatient Hospital Stay (HOSPITAL_BASED_OUTPATIENT_CLINIC_OR_DEPARTMENT_OTHER): Payer: Medicare Other | Admitting: Oncology

## 2022-04-05 ENCOUNTER — Other Ambulatory Visit: Payer: Self-pay

## 2022-04-05 ENCOUNTER — Inpatient Hospital Stay: Payer: Medicare Other

## 2022-04-05 ENCOUNTER — Encounter: Payer: Self-pay | Admitting: Oncology

## 2022-04-05 ENCOUNTER — Other Ambulatory Visit: Payer: Medicare Other

## 2022-04-05 ENCOUNTER — Ambulatory Visit: Payer: Medicare Other | Admitting: Medical Oncology

## 2022-04-05 ENCOUNTER — Ambulatory Visit: Payer: Medicare Other

## 2022-04-05 VITALS — BP 146/77 | HR 59 | Temp 98.5°F | Resp 16 | Wt 173.3 lb

## 2022-04-05 DIAGNOSIS — Z5112 Encounter for antineoplastic immunotherapy: Secondary | ICD-10-CM

## 2022-04-05 DIAGNOSIS — C641 Malignant neoplasm of right kidney, except renal pelvis: Secondary | ICD-10-CM

## 2022-04-05 DIAGNOSIS — R5383 Other fatigue: Secondary | ICD-10-CM

## 2022-04-05 LAB — CBC WITH DIFFERENTIAL/PLATELET
Abs Immature Granulocytes: 0.02 10*3/uL (ref 0.00–0.07)
Basophils Absolute: 0 10*3/uL (ref 0.0–0.1)
Basophils Relative: 1 %
Eosinophils Absolute: 0.2 10*3/uL (ref 0.0–0.5)
Eosinophils Relative: 3 %
HCT: 38.9 % (ref 36.0–46.0)
Hemoglobin: 12.2 g/dL (ref 12.0–15.0)
Immature Granulocytes: 0 %
Lymphocytes Relative: 27 %
Lymphs Abs: 1.5 10*3/uL (ref 0.7–4.0)
MCH: 29.5 pg (ref 26.0–34.0)
MCHC: 31.4 g/dL (ref 30.0–36.0)
MCV: 94 fL (ref 80.0–100.0)
Monocytes Absolute: 0.6 10*3/uL (ref 0.1–1.0)
Monocytes Relative: 10 %
Neutro Abs: 3.3 10*3/uL (ref 1.7–7.7)
Neutrophils Relative %: 59 %
Platelets: 157 10*3/uL (ref 150–400)
RBC: 4.14 MIL/uL (ref 3.87–5.11)
RDW: 14.9 % (ref 11.5–15.5)
WBC: 5.6 10*3/uL (ref 4.0–10.5)
nRBC: 0 % (ref 0.0–0.2)

## 2022-04-05 LAB — COMPREHENSIVE METABOLIC PANEL WITH GFR
ALT: 10 U/L (ref 0–44)
AST: 11 U/L — ABNORMAL LOW (ref 15–41)
Albumin: 4 g/dL (ref 3.5–5.0)
Alkaline Phosphatase: 88 U/L (ref 38–126)
Anion gap: 8 (ref 5–15)
BUN: 59 mg/dL — ABNORMAL HIGH (ref 8–23)
CO2: 20 mmol/L — ABNORMAL LOW (ref 22–32)
Calcium: 10.2 mg/dL (ref 8.9–10.3)
Chloride: 111 mmol/L (ref 98–111)
Creatinine, Ser: 2.09 mg/dL — ABNORMAL HIGH (ref 0.44–1.00)
GFR, Estimated: 24 mL/min — ABNORMAL LOW
Glucose, Bld: 120 mg/dL — ABNORMAL HIGH (ref 70–99)
Potassium: 4.5 mmol/L (ref 3.5–5.1)
Sodium: 139 mmol/L (ref 135–145)
Total Bilirubin: 0.6 mg/dL (ref 0.3–1.2)
Total Protein: 6.9 g/dL (ref 6.5–8.1)

## 2022-04-05 LAB — TSH: TSH: 3.858 u[IU]/mL (ref 0.350–4.500)

## 2022-04-05 MED ORDER — SODIUM CHLORIDE 0.9 % IV SOLN
Freq: Once | INTRAVENOUS | Status: AC
  Filled 2022-04-05: qty 250

## 2022-04-05 MED ORDER — SODIUM CHLORIDE 0.9 % IV SOLN
200.0000 mg | Freq: Once | INTRAVENOUS | Status: AC
  Administered 2022-04-05: 200 mg via INTRAVENOUS
  Filled 2022-04-05: qty 8

## 2022-04-05 NOTE — Progress Notes (Signed)
Hematology/Oncology Consult note [facility]  [phone] [phone]  Patient Care Team: Tracie Harrier, MD as PCP - General (Internal Medicine) Cammie Sickle, MD as Consulting Physician (Oncology)   Name of the patient: Gabrielle Savage  [id number]  09-19-1945   Date of visit: 04/05/22  Diagnosis-stage III renal cell carcinoma on adjuvant [name]  Chief complaint/ Reason for visit-on treatment assessment prior to cycle 7 of adjuvant [name]  Heme/Onc history:  Oncology History Overview Note  UMOR  Tumor Focality: Unifocal  Tumor Size: Greatest dimension 9.3 cm  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  Histologic Grade (WHO / ISUP Grade): G3, nucleoli conspicuous and  eosinophilic at 253 X magnification  Tumor Extent: Extends into major vein (segmental branch adjacent to  renal sinus)  Sarcomatoid Features: Not identified  Rhabdoid Features: Not identified  Tumor Necrosis: Present, microscopic and macroscopic, 20%   MARGINS  Margin Status: All margins negative for invasive carcinoma   REGIONAL LYMPH NODES  Regional Lymph Node Status: Not applicable (no regional lymph nodes  submitted or found)   DISTANT METASTASIS  Distant Site(s) Involved, if applicable: Not applicable   PATHOLOGIC STAGE CLASSIFICATION (pTNM, AJCC 8th Edition):  TNM Descriptors: Applicable  GU4Q  pN - Not assigned (no lymph nodes submitted or found)  pM - Not applicable   # IH4V- stage III; G-3; no sarcomatoid features [right nephrectomy- Dr.Sninksi].  # FEB 14th, 2023- CT AP [GI; [facility]]- . Large mass exophytic extending from lower to mid pole of the RIGHT kidney consistent with renal neoplasm. [Dr.Sninski; Uorlogy]  # FEB 2023- Right lower lobe subpleural 4 mm lung nodule; bone survey negative  #April 7th, 2023- On adjuvant [name]    # CKD III [ sec to HTN > 20 years; none now]; Anemia- EGD/colo- 2023- [[facility]]; Dr.Kowalski [No  CAD]   Clear cell renal cell carcinoma, right ([place])  12/12/2021 Initial Diagnosis   Clear cell renal cell carcinoma, right ([place])   12/18/2021 -  Chemotherapy   Patient is on Treatment Plan : RENAL CELL Pembrolizumab (200) q21d     01/08/2022 Cancer Staging   Staging form: Kidney, AJCC 8th Edition - Pathologic: Stage III (pT3a, pN0, cM0) - Signed by Cammie Sickle, MD on 01/08/2022      Interval history-patient is tolerating [name] well without any significant side effects.  Reports improvement in her fatigue.  ECOG PS- 1 Pain scale- 0   Review of systems- Review of Systems  Constitutional:  Negative for chills, fever, malaise/fatigue and weight loss.  HENT:  Negative for congestion, ear discharge and nosebleeds.   Eyes:  Negative for blurred vision.  Respiratory:  Negative for cough, hemoptysis, sputum production, shortness of breath and wheezing.   Cardiovascular:  Negative for chest pain, palpitations, orthopnea and claudication.  Gastrointestinal:  Negative for abdominal pain, blood in stool, constipation, diarrhea, heartburn, melena, nausea and vomiting.  Genitourinary:  Negative for dysuria, flank pain, frequency, hematuria and urgency.  Musculoskeletal:  Negative for back pain, joint pain and myalgias.  Skin:  Negative for rash.  Neurological:  Negative for dizziness, tingling, focal weakness, seizures, weakness and headaches.  Endo/Heme/Allergies:  Does not bruise/bleed easily.  Psychiatric/Behavioral:  Negative for depression and suicidal ideas. The patient does not have insomnia.       Allergies  Allergen Reactions   Codeine Nausea Only   Nsaids Other (See Comments)    Bad kidneys   Oxycodone Nausea Only     Past Medical  History:  Diagnosis Date   Aortic atherosclerosis (HCC)    Arthritis    B12 deficiency    Bradycardia    CAD (coronary artery disease)    Carotid atherosclerosis    CKD (chronic kidney disease), stage III (HCC)    Diastolic  dysfunction 03/47/4259   a.)  TTE 09/03/2021: EF 63%; no RWMAs; LA mildly enlarged; trivial TR/PR, mild AR, moderate MR; G1DD.   DOE (dyspnea on exertion)    Fatigue    Gout    HLD (hyperlipidemia)    Hypertension    IDA (iron deficiency anemia)    Iliotibial band syndrome, left leg    MDD (major depressive disorder)    OAB (overactive bladder)    Obesity    PAC (premature atrial contraction)    noted on Holter   PONV (postoperative nausea and vomiting)    a.) single episode in 03/2013   PVC (premature ventricular contraction)    noted on Holter   Right kidney mass 10/27/2021   a.) CT 10/27/2021 -- mass measuring 8.3 x 6.8 x 8.1 cm   Sensorineural hearing loss (SNHL) of both ears    SUI (stress urinary incontinence, female)    SVT (supraventricular tachycardia) ([place])    noted on Holter   T2DM (type 2 diabetes mellitus) ([place])    Uterovaginal prolapse    Vitamin D deficiency    Voiding dysfunction      Past Surgical History:  Procedure Laterality Date   ABDOMINAL HYSTERECTOMY     partial   BLADDER SUSPENSION     CHOLECYSTECTOMY     COLONOSCOPY N/A 07/25/2017   Procedure: COLONOSCOPY;  Surgeon: [name], MD;  Location: [facility] ENDOSCOPY;  Service: Endoscopy;  Laterality: N/A;   COLONOSCOPY WITH PROPOFOL N/A 10/19/2021   Procedure: COLONOSCOPY WITH PROPOFOL;  Surgeon: [name], MD;  Location: [facility] ENDOSCOPY;  Service: Endoscopy;  Laterality: N/A;  DM   ESOPHAGOGASTRODUODENOSCOPY (EGD) WITH PROPOFOL N/A 10/19/2021   Procedure: ESOPHAGOGASTRODUODENOSCOPY (EGD) WITH PROPOFOL;  Surgeon: [name], MD;  Location: [facility] ENDOSCOPY;  Service: Endoscopy;  Laterality: N/A;   JOINT REPLACEMENT Bilateral    PARTIAL KNEE REPLACEMENT   KNEE SURGERY     LAPAROSCOPIC NEPHRECTOMY, HAND ASSISTED Right 11/20/2021   Procedure: HAND ASSISTED LAPAROSCOPIC RADICAL NEPHRECTOMY;  Surgeon: Billey Co, MD;  Location: [facility] ORS;  Service: Urology;  Laterality: Right;    PARATHYROIDECTOMY      Social History   Socioeconomic History   Marital status: Married    Spouse name: Not on file   Number of children: Not on file   Years of education: Not on file   Highest education level: Not on file  Occupational History   Not on file  Tobacco Use   Smoking status: Never    Passive exposure: Never   Smokeless tobacco: Never  Vaping Use   Vaping Use: Never used  Substance and Sexual Activity   Alcohol use: Not Currently    Comment: occ   Drug use: No   Sexual activity: Not Currently  Other Topics Concern   Not on file  Social History Narrative   Lives in [place] - close hospital; with husband; 3 biological children [[place]; [place]; [place]]. Never smoked; glass of wine rare. 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    Social Determinants of Health   Financial Resource Strain: Low Risk  (12/07/2021)   Overall Financial Resource Strain (CARDIA)    Difficulty of Paying Living Expenses: Not hard at all  Food  Insecurity: No Food Insecurity (12/07/2021)   Hunger Vital Sign    Worried About Running Out of Food in the Last Year: Never true    Ran Out of Food in the Last Year: Never true  Transportation Needs: No Transportation Needs (12/07/2021)   PRAPARE - Hydrologist (Medical): No    Lack of Transportation (Non-Medical): No  Physical Activity: Insufficiently Active (12/07/2021)   Exercise Vital Sign    Days of Exercise per Week: 2 days    Minutes of Exercise per Session: 30 min  Stress: No Stress Concern Present (12/07/2021)   [place]    Feeling of Stress : Only a little  Social Connections: Moderately Integrated (12/07/2021)   Social Connection and Isolation Panel [NHANES]    Frequency of Communication with Friends and Family: Three times a week    Frequency of Social Gatherings with Friends and Family: Three times a week    Attends Religious Services: 1  to 4 times per year    Active Member of Clubs or Organizations: No    Attends Archivist Meetings: Never    Marital Status: Married  Human resources officer Violence: Not At Risk (12/07/2021)   Humiliation, Afraid, Rape, and Kick questionnaire    Fear of Current or Ex-Partner: No    Emotionally Abused: No    Physically Abused: No    Sexually Abused: No    Family History  Problem Relation Age of Onset   Heart failure Mother    Stroke Father    Lung cancer Father    Breast cancer Neg Hx      Current Outpatient Medications:    allopurinol (ZYLOPRIM) 100 MG tablet, Take 300 mg by mouth every morning. Pt has decreased to '100mg'$ , Disp: , Rfl:    amLODipine (NORVASC) 5 MG tablet, Take 5 mg by mouth every morning., Disp: , Rfl:    citalopram (CELEXA) 10 MG tablet, Take 10 mg by mouth every morning., Disp: , Rfl:    docusate sodium (COLACE) 100 MG capsule, Take 1 capsule (100 mg total) by mouth 2 (two) times daily., Disp: 20 capsule, Rfl: 0   ergocalciferol (VITAMIN D2) 50000 units capsule, Take 50,000 Units by mouth every Saturday., Disp: , Rfl:    ferrous sulfate 325 (65 FE) MG tablet, Take 325 mg by mouth every other day., Disp: , Rfl:    irbesartan (AVAPRO) 150 MG tablet, Take 150 mg by mouth every morning., Disp: , Rfl:    vitamin B-12 (CYANOCOBALAMIN) 1000 MCG tablet, Take 1,000 mcg by mouth daily., Disp: , Rfl:    ondansetron (ZOFRAN) 8 MG tablet, Take 1 tablet (8 mg total) by mouth 2 (two) times daily as needed (Nausea or vomiting). (Patient not taking: Reported on 04/05/2022), Disp: 30 tablet, Rfl: 1   prochlorperazine (COMPAZINE) 10 MG tablet, Take 1 tablet (10 mg total) by mouth every 6 (six) hours as needed (Nausea or vomiting). (Patient not taking: Reported on 04/05/2022), Disp: 30 tablet, Rfl: 1  Physical exam:  Vitals:   04/05/22 0905  BP: (!) 146/77  Pulse: (!) 59  Resp: 16  Temp: 98.5 F (36.9 C)  SpO2: 97%  Weight: 173 lb 4.8 oz (78.6 kg)   Physical  Exam Constitutional:      General: She is not in acute distress. Cardiovascular:     Rate and Rhythm: Normal rate and regular rhythm.     Heart sounds: Normal heart sounds.  Pulmonary:     Effort: Pulmonary effort is normal.     Breath sounds: Normal breath sounds.  Abdominal:     General: Bowel sounds are normal.     Palpations: Abdomen is soft.  Skin:    General: Skin is warm and dry.  Neurological:     Mental Status: She is alert and oriented to person, place, and time.         Latest Ref Rng & Units 04/05/2022    8:49 AM  CMP  Glucose 70 - 99 mg/dL 120   BUN 8 - 23 mg/dL 59   Creatinine 0.44 - 1.00 mg/dL 2.09   Sodium 135 - 145 mmol/L 139   Potassium 3.5 - 5.1 mmol/L 4.5   Chloride 98 - 111 mmol/L 111   CO2 22 - 32 mmol/L 20   Calcium 8.9 - 10.3 mg/dL 10.2   Total Protein 6.5 - 8.1 g/dL 6.9   Total Bilirubin 0.3 - 1.2 mg/dL 0.6   Alkaline Phos 38 - 126 U/L 88   AST 15 - 41 U/L 11   ALT 0 - 44 U/L 10       Latest Ref Rng & Units 04/05/2022    8:49 AM  CBC  WBC 4.0 - 10.5 K/uL 5.6   Hemoglobin 12.0 - 15.0 g/dL 12.2   Hematocrit 36.0 - 46.0 % 38.9   Platelets 150 - 400 K/uL 157     No images are attached to the encounter.  No results found.   Assessment and plan- Patient is a 76 y.o. female with history of stage IIIa clear-cell renal cell carcinoma of the right kidney year for on treatment assessment prior to cycle 6 of [name]  Counts okay to proceed with cycle 6 of [name] today.  Port labs CBC with differential CMP and see Dr. Lynett Fish in 3 weeks for cycle 7  Fatigue: Overall improved.  If it recurs, cortisol levels could be checked.  CKD stage IV: Overall stable continue to monitor  Anemia: Hemoglobin today improved to 12.2.   Visit Diagnosis 1. Encounter for antineoplastic immunotherapy   2. Clear cell renal cell carcinoma, right (HCC)      Dr. Randa Evens, MD, MPH [facility] [phone] 04/05/2022 1:06  PM

## 2022-04-05 NOTE — Patient Instructions (Signed)
[facility]  Discharge Instructions: Thank you for choosing [facility] to provide your oncology and hematology care.  If you have a lab appointment with the Cancer Center, please go directly to the Cancer Center and check in at the registration area.  Wear comfortable clothing and clothing appropriate for easy access to any Portacath or PICC line.   We strive to give you quality time with your provider. You may need to reschedule your appointment if you arrive late (15 or more minutes).  Arriving late affects you and other patients whose appointments are after yours.  Also, if you miss three or more appointments without notifying the office, you may be dismissed from the clinic at the provider's discretion.      For prescription refill requests, have your pharmacy contact our office and allow 72 hours for refills to be completed.    Today you received the following chemotherapy and/or immunotherapy agents [name]      To help prevent nausea and vomiting after your treatment, we encourage you to take your nausea medication as directed.  BELOW ARE SYMPTOMS THAT SHOULD BE REPORTED IMMEDIATELY: *FEVER GREATER THAN 100.4 F (38 C) OR HIGHER *CHILLS OR SWEATING *NAUSEA AND VOMITING THAT IS NOT CONTROLLED WITH YOUR NAUSEA MEDICATION *UNUSUAL SHORTNESS OF BREATH *UNUSUAL BRUISING OR BLEEDING *URINARY PROBLEMS (pain or burning when urinating, or frequent urination) *BOWEL PROBLEMS (unusual diarrhea, constipation, pain near the anus) TENDERNESS IN MOUTH AND THROAT WITH OR WITHOUT PRESENCE OF ULCERS (sore throat, sores in mouth, or a toothache) UNUSUAL RASH, SWELLING OR PAIN  UNUSUAL VAGINAL DISCHARGE OR ITCHING   Items with * indicate a potential emergency and should be followed up as soon as possible or go to the Emergency Department if any problems should occur.  Please show the CHEMOTHERAPY ALERT CARD or IMMUNOTHERAPY ALERT CARD at check-in to  the Emergency Department and triage nurse.  Should you have questions after your visit or need to cancel or reschedule your appointment, please contact [facility]  [phone] and follow the prompts.  Office hours are 8:00 a.m. to 4:30 p.m. Monday - Friday. Please note that voicemails left after 4:00 p.m. may not be returned until the following business day.  We are closed weekends and major holidays. You have access to a nurse at all times for urgent questions. Please call the main number to the clinic [phone] and follow the prompts.  For any non-urgent questions, you may also contact your provider using MyChart. We now offer e-Visits for anyone 18 and older to request care online for non-urgent symptoms. For details visit [email or web address].   Also download the MyChart app! Go to the app store, search "MyChart", open the app, select [place], and log in with your MyChart username and password.  Masks are optional in the cancer centers. If you would like for your care team to wear a mask while they are taking care of you, please let them know. For doctor visits, patients may have with them one support person who is at least 74 years old. At this time, visitors are not allowed in the infusion area.   

## 2022-04-13 ENCOUNTER — Other Ambulatory Visit: Payer: Self-pay

## 2022-04-15 ENCOUNTER — Telehealth: Payer: Self-pay | Admitting: *Deleted

## 2022-04-15 NOTE — Telephone Encounter (Signed)
Patient called back stating that her headache started yesterday and she is using extra strength Tylenol which takes the edge off  rates headache at 7/10 pain wise. She states her whole family has had it and it lasted about 2 weeks. Denies sinus congestion, but has had runny nose occasional sneezing. She reports she also has the fatigue and is having to sleep a lot napping 2 -3 times a day for 1 1/2 hours in addition to 6 hours of sleep at night She states her temp is 98.6

## 2022-04-15 NOTE — Telephone Encounter (Signed)
Patient called reporting that she now has the same bug her family has had with headache and fatigue. She is asking if she needs to do anything or just to let it run it's course. Please advise

## 2022-04-15 NOTE — Telephone Encounter (Signed)
Contacted patient. Rn asked patient to get tested for covid to r/o covid-19. She will do this and take her home test tonight. [facility] virtual visit set up for 04/19/22 at 930 am.

## 2022-04-16 ENCOUNTER — Inpatient Hospital Stay: Payer: Medicare Other | Attending: Internal Medicine | Admitting: Hospice and Palliative Medicine

## 2022-04-16 ENCOUNTER — Encounter: Payer: Self-pay | Admitting: Hospice and Palliative Medicine

## 2022-04-16 DIAGNOSIS — R519 Headache, unspecified: Secondary | ICD-10-CM | POA: Diagnosis not present

## 2022-04-16 DIAGNOSIS — Z5112 Encounter for antineoplastic immunotherapy: Secondary | ICD-10-CM | POA: Insufficient documentation

## 2022-04-16 DIAGNOSIS — Z79899 Other long term (current) drug therapy: Secondary | ICD-10-CM | POA: Insufficient documentation

## 2022-04-16 DIAGNOSIS — C641 Malignant neoplasm of right kidney, except renal pelvis: Secondary | ICD-10-CM | POA: Insufficient documentation

## 2022-04-16 NOTE — Progress Notes (Signed)
Virtual Visit via virtual note  I connected with Gabrielle Savage on 04/16/22 at  9:30 AM EDT by telemedicine and verified that I am speaking with the correct person using two identifiers.  Location: Patient: Home Provider: Clinic   I discussed the limitations, risks, security and privacy concerns of performing an evaluation and management service by telephone and the availability of in person appointments. I also discussed with the patient that there may be a patient responsible charge related to this service. The patient expressed understanding and agreed to proceed.   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  Patient requested virtual visit today for evaluation of 2 to 3 days of headaches and frontal sinus pressure after family had similar viral infection last week.   Observations/Objective: Patient seen via Marie visit.  She describes 2 to 3 days of frontal sinus pressure and intermittent headaches but denies fever or chills.  She has some rhinorrhea and nasal congestion.  She reports history of sinus infections but does not feel like it is progressed to that yet.  No sore throat.  No shortness of breath or chest pain.  No GI or GU symptoms.  Patient reports recent sick contacts with family who had similar "viral" illness last week.  She took a home COVID test this morning, which was negative.  Assessment and Plan: Headaches -likely viral or early sinusitis given rhinorrhea and sinus pressure symptomatology.  Negative home COVID test.  Recommended that she obtain COVID PCR testing and will give her a prescription and information/referral to community testing center.  Patient does not have fever or chills.  She is taking acetaminophen with good effect at managing headaches.  Recommend continued symptomatic care.  She can add fluticasone nasal spray.  Patient to call  back with any changes or worsening symptoms.  If COVID PCR is positive, we will proceed with treatment.  Follow Up Instructions: RTC on 8/14 to see Dr. Rogue Bussing   I discussed the assessment and treatment plan with the patient. The patient was provided an opportunity to ask questions and all were answered. The patient agreed with the plan and demonstrated an understanding of the instructions.   The patient was advised to call back or seek an in-person evaluation if the symptoms worsen or if the condition fails to improve as anticipated.  I provided 15 minutes of non-face-to-face time during this encounter.   Irean Hong, NP

## 2022-04-24 ENCOUNTER — Other Ambulatory Visit: Payer: Self-pay | Admitting: Internal Medicine

## 2022-04-24 DIAGNOSIS — C641 Malignant neoplasm of right kidney, except renal pelvis: Secondary | ICD-10-CM

## 2022-04-26 ENCOUNTER — Inpatient Hospital Stay: Payer: Medicare Other

## 2022-04-26 ENCOUNTER — Inpatient Hospital Stay (HOSPITAL_BASED_OUTPATIENT_CLINIC_OR_DEPARTMENT_OTHER): Payer: Medicare Other | Admitting: Internal Medicine

## 2022-04-26 ENCOUNTER — Encounter: Payer: Self-pay | Admitting: Internal Medicine

## 2022-04-26 VITALS — BP 122/86 | HR 57 | Temp 97.6°F | Resp 16 | Wt 172.6 lb

## 2022-04-26 DIAGNOSIS — C641 Malignant neoplasm of right kidney, except renal pelvis: Secondary | ICD-10-CM | POA: Diagnosis present

## 2022-04-26 DIAGNOSIS — E669 Obesity, unspecified: Secondary | ICD-10-CM

## 2022-04-26 DIAGNOSIS — Z79899 Other long term (current) drug therapy: Secondary | ICD-10-CM | POA: Diagnosis not present

## 2022-04-26 DIAGNOSIS — R5382 Chronic fatigue, unspecified: Secondary | ICD-10-CM

## 2022-04-26 DIAGNOSIS — Z5112 Encounter for antineoplastic immunotherapy: Secondary | ICD-10-CM

## 2022-04-26 DIAGNOSIS — Z6831 Body mass index (BMI) 31.0-31.9, adult: Secondary | ICD-10-CM

## 2022-04-26 LAB — COMPREHENSIVE METABOLIC PANEL
ALT: 31 U/L (ref 0–44)
AST: 23 U/L (ref 15–41)
Albumin: 4.2 g/dL (ref 3.5–5.0)
Alkaline Phosphatase: 94 U/L (ref 38–126)
Anion gap: 10 (ref 5–15)
BUN: 55 mg/dL — ABNORMAL HIGH (ref 8–23)
CO2: 19 mmol/L — ABNORMAL LOW (ref 22–32)
Calcium: 10.4 mg/dL — ABNORMAL HIGH (ref 8.9–10.3)
Chloride: 110 mmol/L (ref 98–111)
Creatinine, Ser: 2.04 mg/dL — ABNORMAL HIGH (ref 0.44–1.00)
GFR, Estimated: 25 mL/min — ABNORMAL LOW (ref >60)
Glucose, Bld: 110 mg/dL — ABNORMAL HIGH (ref 70–99)
Potassium: 4.1 mmol/L (ref 3.5–5.1)
Sodium: 139 mmol/L (ref 135–145)
Total Bilirubin: 0.3 mg/dL (ref 0.3–1.2)
Total Protein: 7.2 g/dL (ref 6.5–8.1)

## 2022-04-26 LAB — CBC WITH DIFFERENTIAL/PLATELET
Abs Immature Granulocytes: 0.01 10*3/uL (ref 0.00–0.07)
Basophils Absolute: 0 10*3/uL (ref 0.0–0.1)
Basophils Relative: 1 %
Eosinophils Absolute: 0.2 10*3/uL (ref 0.0–0.5)
Eosinophils Relative: 3 %
HCT: 40 % (ref 36.0–46.0)
Hemoglobin: 12.5 g/dL (ref 12.0–15.0)
Immature Granulocytes: 0 %
Lymphocytes Relative: 31 %
Lymphs Abs: 2.1 10*3/uL (ref 0.7–4.0)
MCH: 29.3 pg (ref 26.0–34.0)
MCHC: 31.3 g/dL (ref 30.0–36.0)
MCV: 93.7 fL (ref 80.0–100.0)
Monocytes Absolute: 0.6 10*3/uL (ref 0.1–1.0)
Monocytes Relative: 9 %
Neutro Abs: 3.7 10*3/uL (ref 1.7–7.7)
Neutrophils Relative %: 56 %
Platelets: 160 10*3/uL (ref 150–400)
RBC: 4.27 MIL/uL (ref 3.87–5.11)
RDW: 14.6 % (ref 11.5–15.5)
WBC: 6.6 10*3/uL (ref 4.0–10.5)
nRBC: 0 % (ref 0.0–0.2)

## 2022-04-26 MED ORDER — SODIUM CHLORIDE 0.9 % IV SOLN
Freq: Once | INTRAVENOUS | Status: AC
  Filled 2022-04-26: qty 250

## 2022-04-26 MED ORDER — SODIUM CHLORIDE 0.9 % IV SOLN
200.0000 mg | Freq: Once | INTRAVENOUS | Status: AC
  Administered 2022-04-26: 200 mg via INTRAVENOUS
  Filled 2022-04-26: qty 8

## 2022-04-26 NOTE — Assessment & Plan Note (Addendum)
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  Tolerating [name] fairly well except for joint pain/fatigue.   #Proceed with [name] #7 today. Labs today reviewed;  acceptable for treatment today. July 2023- TSH-NL.    # Intermittently- elevated calcium- 10.4; STOP vit D 50k; check vit D levels.   #Anemia: Hemoglobin around 12 overall stable/improving; on PO iron every other day. STABLE.    # Bil Knee pain:?  Secondary to [name] s/p steroids  X 4-5 days- resolved; monitor closely- STABLE.   #CKD stage IV- GFR ~25; Continue enough hydration. Monitor closely. [s/p [facility] evaluation on June 1st]- STABLE.   # HTN- Improved; discussed re: avoiding nephrotoxic agents-STABLE.   #Diabetes: Blood sugars 116; patient on metformin on glimepiride [as per nephrology]  # fatigue: intermittently- CARE program.   # IV ACCESS: PIV.    # DISPOSITION:  # [name] today  # CARE program ASAP re: fatigue  # follow up in 3 weeks/Tuesday or Wednesday - MD; labs- cbc/cmp;TSH; vit D 25 OH levels-  [name]- Dr.B

## 2022-04-26 NOTE — Progress Notes (Signed)
Pt reports still feeling very fatigued.  States she fell at daughters house outside over the weekend.  Reports soreness in back of left upper leg.

## 2022-04-26 NOTE — Patient Instructions (Signed)
[facility]  Discharge Instructions: Thank you for choosing [facility] to provide your oncology and hematology care.  If you have a lab appointment with the Cancer Center, please go directly to the Cancer Center and check in at the registration area.  Wear comfortable clothing and clothing appropriate for easy access to any Portacath or PICC line.   We strive to give you quality time with your provider. You may need to reschedule your appointment if you arrive late (15 or more minutes).  Arriving late affects you and other patients whose appointments are after yours.  Also, if you miss three or more appointments without notifying the office, you may be dismissed from the clinic at the provider's discretion.      For prescription refill requests, have your pharmacy contact our office and allow 72 hours for refills to be completed.    Today you received the following chemotherapy and/or immunotherapy agents [name]      To help prevent nausea and vomiting after your treatment, we encourage you to take your nausea medication as directed.  BELOW ARE SYMPTOMS THAT SHOULD BE REPORTED IMMEDIATELY: *FEVER GREATER THAN 100.4 F (38 C) OR HIGHER *CHILLS OR SWEATING *NAUSEA AND VOMITING THAT IS NOT CONTROLLED WITH YOUR NAUSEA MEDICATION *UNUSUAL SHORTNESS OF BREATH *UNUSUAL BRUISING OR BLEEDING *URINARY PROBLEMS (pain or burning when urinating, or frequent urination) *BOWEL PROBLEMS (unusual diarrhea, constipation, pain near the anus) TENDERNESS IN MOUTH AND THROAT WITH OR WITHOUT PRESENCE OF ULCERS (sore throat, sores in mouth, or a toothache) UNUSUAL RASH, SWELLING OR PAIN  UNUSUAL VAGINAL DISCHARGE OR ITCHING   Items with * indicate a potential emergency and should be followed up as soon as possible or go to the Emergency Department if any problems should occur.  Please show the CHEMOTHERAPY ALERT CARD or IMMUNOTHERAPY ALERT CARD at check-in to  the Emergency Department and triage nurse.  Should you have questions after your visit or need to cancel or reschedule your appointment, please contact [facility]  [phone] and follow the prompts.  Office hours are 8:00 a.m. to 4:30 p.m. Monday - Friday. Please note that voicemails left after 4:00 p.m. may not be returned until the following business day.  We are closed weekends and major holidays. You have access to a nurse at all times for urgent questions. Please call the main number to the clinic [phone] and follow the prompts.  For any non-urgent questions, you may also contact your provider using MyChart. We now offer e-Visits for anyone 18 and older to request care online for non-urgent symptoms. For details visit [email or web address].   Also download the MyChart app! Go to the app store, search "MyChart", open the app, select [place], and log in with your MyChart username and password.  Masks are optional in the cancer centers. If you would like for your care team to wear a mask while they are taking care of you, please let them know. For doctor visits, patients may have with them one support person who is at least 74 years old. At this time, visitors are not allowed in the infusion area.   

## 2022-04-26 NOTE — Progress Notes (Signed)
[facility] NOTE  Patient Care Team: Tracie Harrier, MD as PCP - General (Internal Medicine) Cammie Sickle, MD as Consulting Physician (Oncology)  CHIEF COMPLAINTS/PURPOSE OF CONSULTATION: Kidney cancer   Oncology History Overview Note  UMOR  Tumor Focality: Unifocal  Tumor Size: Greatest dimension 9.3 cm  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  Histologic Grade (WHO / ISUP Grade): G3, nucleoli conspicuous and  eosinophilic at 253 X magnification  Tumor Extent: Extends into major vein (segmental branch adjacent to  renal sinus)  Sarcomatoid Features: Not identified  Rhabdoid Features: Not identified  Tumor Necrosis: Present, microscopic and macroscopic, 20%   MARGINS  Margin Status: All margins negative for invasive carcinoma   REGIONAL LYMPH NODES  Regional Lymph Node Status: Not applicable (no regional lymph nodes  submitted or found)   DISTANT METASTASIS  Distant Site(s) Involved, if applicable: Not applicable   PATHOLOGIC STAGE CLASSIFICATION (pTNM, AJCC 8th Edition):  TNM Descriptors: Applicable  GU4Q  pN - Not assigned (no lymph nodes submitted or found)  pM - Not applicable   # IH4V- stage III; G-3; no sarcomatoid features [right nephrectomy- Dr.Sninksi].  # FEB 14th, 2023- CT AP [GI; [facility]]- . Large mass exophytic extending from lower to mid pole of the RIGHT kidney consistent with renal neoplasm. [Dr.Sninski; [facility]]  # FEB 2023- Right lower lobe subpleural 4 mm lung nodule; bone survey negative  #April 7th, 2023- On adjuvant [name]    # CKD III [ sec to HTN > 20 years; none now]; Anemia- EGD/colo- 2023- [[facility]]; Dr.Kowalski [No CAD]   Clear cell renal cell carcinoma, right ([place])  12/12/2021 Initial Diagnosis   Clear cell renal cell carcinoma, right ([place])   12/18/2021 - 04/05/2022 Chemotherapy   Patient is on Treatment Plan : RENAL CELL Pembrolizumab (200) q21d     12/18/2021 -  Chemotherapy   Patient is on  Treatment Plan : kidney adjuvant Pembrolizumab (200) q21d     01/08/2022 Cancer Staging   Staging form: Kidney, AJCC 8th Edition - Pathologic: Stage III (pT3a, pN0, cM0) - Signed by Cammie Sickle, MD on 01/08/2022    HISTORY OF PRESENTING ILLNESS: Ambulating independently.    Gabrielle Savage 76 y.o.  female with history of stage III kidney cancer; CKD stage III currently on adjuvant Beryle Flock is here for follow-up.    Pt reports still feeling very fatigued.  States she fell at daughters house outside over the weekend.  Reports soreness in back of left upper leg.  Patient is fatigued.  Denies any worsening shortness of breath or cough.  Denies any worsening joint pains.  No nausea no vomiting.  Review of Systems  Constitutional:  Positive for malaise/fatigue. Negative for chills and fever.  HENT:  Negative for nosebleeds and sore throat.   Eyes:  Negative for double vision.  Respiratory:  Negative for cough, hemoptysis, sputum production and wheezing.   Cardiovascular:  Negative for chest pain, palpitations, orthopnea and leg swelling.  Gastrointestinal:  Negative for abdominal pain, blood in stool, constipation, diarrhea, heartburn, melena, nausea and vomiting.  Genitourinary:  Negative for dysuria, frequency and urgency.  Musculoskeletal:  Positive for back pain and joint pain.  Skin: Negative.  Negative for itching and rash.  Neurological:  Negative for dizziness, tingling, focal weakness, weakness and headaches.  Endo/Heme/Allergies:  Does not bruise/bleed easily.  Psychiatric/Behavioral:  Negative for depression. The patient is not nervous/anxious and does not have insomnia.      MEDICAL HISTORY:  Past  Medical History:  Diagnosis Date   Aortic atherosclerosis (HCC)    Arthritis    B12 deficiency    Bradycardia    CAD (coronary artery disease)    Carotid atherosclerosis    CKD (chronic kidney disease), stage III (HCC)    Diastolic dysfunction 38/06/1750   a.)  TTE  09/03/2021: EF 63%; no RWMAs; LA mildly enlarged; trivial TR/PR, mild AR, moderate MR; G1DD.   DOE (dyspnea on exertion)    Fatigue    Gout    HLD (hyperlipidemia)    Hypertension    IDA (iron deficiency anemia)    Iliotibial band syndrome, left leg    MDD (major depressive disorder)    OAB (overactive bladder)    Obesity    PAC (premature atrial contraction)    noted on Holter   PONV (postoperative nausea and vomiting)    a.) single episode in 03/2013   PVC (premature ventricular contraction)    noted on Holter   Right kidney mass 10/27/2021   a.) CT 10/27/2021 -- mass measuring 8.3 x 6.8 x 8.1 cm   Sensorineural hearing loss (SNHL) of both ears    SUI (stress urinary incontinence, female)    SVT (supraventricular tachycardia) ([place])    noted on Holter   T2DM (type 2 diabetes mellitus) ([place])    Uterovaginal prolapse    Vitamin D deficiency    Voiding dysfunction     SURGICAL HISTORY: Past Surgical History:  Procedure Laterality Date   ABDOMINAL HYSTERECTOMY     partial   BLADDER SUSPENSION     CHOLECYSTECTOMY     COLONOSCOPY N/A 07/25/2017   Procedure: COLONOSCOPY;  Surgeon: [name], MD;  Location: [facility] ENDOSCOPY;  Service: Endoscopy;  Laterality: N/A;   COLONOSCOPY WITH PROPOFOL N/A 10/19/2021   Procedure: COLONOSCOPY WITH PROPOFOL;  Surgeon: [name], MD;  Location: [facility] ENDOSCOPY;  Service: Endoscopy;  Laterality: N/A;  DM   ESOPHAGOGASTRODUODENOSCOPY (EGD) WITH PROPOFOL N/A 10/19/2021   Procedure: ESOPHAGOGASTRODUODENOSCOPY (EGD) WITH PROPOFOL;  Surgeon: [name], MD;  Location: [facility] ENDOSCOPY;  Service: Endoscopy;  Laterality: N/A;   JOINT REPLACEMENT Bilateral    PARTIAL KNEE REPLACEMENT   KNEE SURGERY     LAPAROSCOPIC NEPHRECTOMY, HAND ASSISTED Right 11/20/2021   Procedure: HAND ASSISTED LAPAROSCOPIC RADICAL NEPHRECTOMY;  Surgeon: Billey Co, MD;  Location: [facility] ORS;  Service: Urology;  Laterality: Right;   PARATHYROIDECTOMY       SOCIAL HISTORY: Social History   Socioeconomic History   Marital status: Married    Spouse name: Not on file   Number of children: Not on file   Years of education: Not on file   Highest education level: Not on file  Occupational History   Not on file  Tobacco Use   Smoking status: Never    Passive exposure: Never   Smokeless tobacco: Never  Vaping Use   Vaping Use: Never used  Substance and Sexual Activity   Alcohol use: Not Currently    Comment: occ   Drug use: No   Sexual activity: Not Currently  Other Topics Concern   Not on file  Social History Narrative   Lives in [place] - close hospital; with husband; 3 biological children [[place]; [place]; [place]]. Never smoked; glass of wine rare. 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    Social Determinants of Health   Financial Resource Strain: Low Risk  (12/07/2021)   Overall Financial Resource Strain (CARDIA)    Difficulty of Paying Living Expenses: Not hard  at all  Food Insecurity: No Food Insecurity (12/07/2021)   Hunger Vital Sign    Worried About Running Out of Food in the Last Year: Never true    Ran Out of Food in the Last Year: Never true  Transportation Needs: No Transportation Needs (12/07/2021)   PRAPARE - Hydrologist (Medical): No    Lack of Transportation (Non-Medical): No  Physical Activity: Insufficiently Active (12/07/2021)   Exercise Vital Sign    Days of Exercise per Week: 2 days    Minutes of Exercise per Session: 30 min  Stress: No Stress Concern Present (12/07/2021)   [place]    Feeling of Stress : Only a little  Social Connections: Moderately Integrated (12/07/2021)   Social Connection and Isolation Panel [NHANES]    Frequency of Communication with Friends and Family: Three times a week    Frequency of Social Gatherings with Friends and Family: Three times a week    Attends Religious Services: 1  to 4 times per year    Active Member of Clubs or Organizations: No    Attends Archivist Meetings: Never    Marital Status: Married  Human resources officer Violence: Not At Risk (12/07/2021)   Humiliation, Afraid, Rape, and Kick questionnaire    Fear of Current or Ex-Partner: No    Emotionally Abused: No    Physically Abused: No    Sexually Abused: No    FAMILY HISTORY: Family History  Problem Relation Age of Onset   Heart failure Mother    Stroke Father    Lung cancer Father    Breast cancer Neg Hx     ALLERGIES:  is allergic to codeine, nsaids, and oxycodone.  MEDICATIONS:  Current Outpatient Medications  Medication Sig Dispense Refill   allopurinol (ZYLOPRIM) 100 MG tablet Take 300 mg by mouth every morning. Pt has decreased to '100mg'$      amLODipine (NORVASC) 5 MG tablet Take 5 mg by mouth every morning.     citalopram (CELEXA) 10 MG tablet Take 10 mg by mouth every morning.     ergocalciferol (VITAMIN D2) 50000 units capsule Take 50,000 Units by mouth every Saturday.     ferrous sulfate 325 (65 FE) MG tablet Take 325 mg by mouth every other day.     irbesartan (AVAPRO) 150 MG tablet Take 150 mg by mouth every morning.     vitamin B-12 (CYANOCOBALAMIN) 1000 MCG tablet Take 1,000 mcg by mouth daily.     docusate sodium (COLACE) 100 MG capsule Take 1 capsule (100 mg total) by mouth 2 (two) times daily. (Patient not taking: Reported on 04/26/2022) 20 capsule 0   No current facility-administered medications for this visit.      Marland Kitchen  PHYSICAL EXAMINATION:  Vitals:   04/26/22 0953  BP: 122/86  Pulse: (!) 57  Resp: 16  Temp: 97.6 F (36.4 C)  SpO2: 100%   Filed Weights   04/26/22 0953  Weight: 172 lb 9.6 oz (78.3 kg)    Physical Exam Vitals and nursing note reviewed.  HENT:     Head: Normocephalic and atraumatic.     Mouth/Throat:     Pharynx: Oropharynx is clear.  Eyes:     Extraocular Movements: Extraocular movements intact.     Pupils: Pupils are equal,  round, and reactive to light.  Cardiovascular:     Rate and Rhythm: Normal rate and regular rhythm.  Pulmonary:  Comments: Decreased breath sounds bilaterally.  Abdominal:     Palpations: Abdomen is soft.  Musculoskeletal:        General: Normal range of motion.     Cervical back: Normal range of motion.  Skin:    General: Skin is warm.  Neurological:     General: No focal deficit present.     Mental Status: She is alert and oriented to person, place, and time.  Psychiatric:        Behavior: Behavior normal.        Judgment: Judgment normal.      LABORATORY DATA:  I have reviewed the data as listed Lab Results  Component Value Date   WBC 6.6 04/26/2022   HGB 12.5 04/26/2022   HCT 40.0 04/26/2022   MCV 93.7 04/26/2022   PLT 160 04/26/2022   Recent Labs    03/15/22 0819 04/05/22 0849 04/26/22 0903  NA 140 139 139  K 4.4 4.5 4.1  CL 114* 111 110  CO2 19* 20* 19*  GLUCOSE 119* 120* 110*  BUN 64* 59* 55*  CREATININE 2.36* 2.09* 2.04*  CALCIUM 9.6 10.2 10.4*  GFRNONAA 21* 24* 25*  PROT 6.4* 6.9 7.2  ALBUMIN 3.8 4.0 4.2  AST 12* 11* 23  ALT '9 10 31  '$ ALKPHOS 86 88 94  BILITOT 0.5 0.6 0.3    RADIOGRAPHIC STUDIES: I have personally reviewed the radiological images as listed and agreed with the findings in the report. No results found.  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  Tolerating [name] fairly well except for joint pain/fatigue.   #Proceed with [name] #7 today. Labs today reviewed;  acceptable for treatment today. July 2023- TSH-NL.    # Intermittently- elevated calcium- 10.4; STOP vit D 50k; check vit D levels.   #Anemia: Hemoglobin around 12 overall stable/improving; on PO iron every other day. STABLE.    # Bil Knee pain:?  Secondary to [name] s/p steroids  X 4-5 days- resolved; monitor closely- STABLE.   #CKD stage IV- GFR ~25; Continue enough hydration. Monitor closely.  [s/p [facility] evaluation on June 1st]- STABLE.   # HTN- Improved; discussed re: avoiding nephrotoxic agents-STABLE.   #Diabetes: Blood sugars 116; patient on metformin on glimepiride [as per nephrology]  # fatigue: intermittently- CARE program.   # IV ACCESS: PIV.    # DISPOSITION:  # [name] today  # CARE program ASAP re: fatigue  # follow up in 3 weeks/Tuesday or Wednesday - MD; labs- cbc/cmp;TSH; vit D 25 OH levels-  [name]- Dr.B    All questions were answered. The patient knows to call the clinic with any problems, questions or concerns.     Cammie Sickle, MD 04/26/2022 4:52 PM

## 2022-04-28 ENCOUNTER — Other Ambulatory Visit: Payer: Self-pay

## 2022-05-10 ENCOUNTER — Encounter: Payer: Medicare Other | Attending: Nurse Practitioner

## 2022-05-10 DIAGNOSIS — C641 Malignant neoplasm of right kidney, except renal pelvis: Secondary | ICD-10-CM

## 2022-05-10 NOTE — Progress Notes (Signed)
Completed phone call interview questions with patient regarding CARE program. Medications, medical history reviewed with patient. All questions answered. Patient's 6MWT scheduled for tomorrow, 8/29.

## 2022-05-11 VITALS — Ht 63.0 in | Wt 175.3 lb

## 2022-05-11 DIAGNOSIS — C641 Malignant neoplasm of right kidney, except renal pelvis: Secondary | ICD-10-CM

## 2022-05-11 NOTE — Progress Notes (Signed)
CARE Daily Session Note  Patient Details  Name: Gabrielle Savage MRN: [id number] Date of Birth: [birth year] Referring Provider:   [name] Cancer Associated Rehabilitation & Exercise from 05/11/2022 in [facility] Cardiac and Pulmonary Rehab  Referring Provider Charlaine Dalton MD       Encounter Date: 05/11/2022  Check In:  Session Check In - 05/11/22 1407       Check-In   Supervising physician immediately available to respond to emergencies See telemetry face sheet for immediately available ER MD    Location [facility]-Cardiac & Pulmonary Rehab    Staff Present Coralie Keens, MS, ASCM CEP, Exercise Physiologist;Jessica Luan Pulling, MA, RCEP, CCRP, CCET;Noah Tickle, BS, Exercise Physiologist    Virtual Visit No    Medication changes reported     No    Fall or balance concerns reported    No    Warm-up and Cool-down Not performed (comment)   6MWT and Gym Orientation   Resistance Training Performed No    VAD Patient? No    PAD/SET Patient? No               Exercise Prescription Changes - 05/11/22 1400       Response to Exercise   Blood Pressure (Admit) 112/58    Blood Pressure (Exercise) 162/56    Blood Pressure (Exit) 124/60    Heart Rate (Admit) 62 bpm    Heart Rate (Exercise) 101 bpm    Heart Rate (Exit) 63 bpm    Oxygen Saturation (Admit) 97 %    Oxygen Saturation (Exercise) 96 %    Oxygen Saturation (Exit) 95 %    Rating of Perceived Exertion (Exercise) 11    Perceived Dyspnea (Exercise) 0    Symptoms none    Comments walk test results             Social History   Tobacco Use  Smoking Status Never   Passive exposure: Never  Smokeless Tobacco Never    Goals Met:  Proper associated with RPD/PD & O2 Sat Exercise tolerated well Personal goals reviewed No report of concerns or symptoms today  Goals Unmet:  Not Applicable  Comments: First full day of orientation. All starting workloads were established based on the results of the 6 minute walk test done at  initial orientation visit.  The plan for exercise progression was also introduced and progression will be customized based on patient's performance and goals.    Dr. Emily Filbert is Medical Director for [place].  Dr. Ottie Glazier is Medical Director for [facility] Pulmonary Rehabilitation.

## 2022-05-13 DIAGNOSIS — C641 Malignant neoplasm of right kidney, except renal pelvis: Secondary | ICD-10-CM

## 2022-05-13 NOTE — Progress Notes (Signed)
Daily Session Note  Patient Details  Name: Gabrielle Savage MRN: [id number] Date of Birth: 1945/10/04 Referring Provider:   [name] Cancer Associated Rehabilitation & Exercise from 05/11/2022 in [facility] Cardiac and Pulmonary Rehab  Referring Provider Charlaine Dalton MD       Encounter Date: 05/13/2022  Check In:  Session Check In - 05/13/22 1337       Check-In   Supervising physician immediately available to respond to emergencies See telemetry face sheet for immediately available ER MD    Location [facility]-Cardiac & Pulmonary Rehab    Staff Present Coralie Keens, MS, ASCM CEP, Exercise Physiologist;Jessica Luan Pulling, MA, RCEP, CCRP, CCET    Virtual Visit No    Medication changes reported     No    Fall or balance concerns reported    No    Warm-up and Cool-down Performed on first and last piece of equipment    Resistance Training Performed Yes   yoga   VAD Patient? No    PAD/SET Patient? No      Pain Assessment   Currently in Pain? No/denies                Social History   Tobacco Use  Smoking Status Never   Passive exposure: Never  Smokeless Tobacco Never    Goals Met:  Proper associated with RPD/PD & O2 Sat Exercise tolerated well Queuing for purse lip breathing No report of concerns or symptoms today Strength training completed today  Goals Unmet:  Not Applicable  Comments: First full day of exercise!  Patient was oriented to gym and equipment including functions, settings, policies, and procedures.  Patient's individual exercise prescription and treatment plan were reviewed.  All starting workloads were established based on the results of the 6 minute walk test done at initial orientation visit.  The plan for exercise progression was also introduced and progression will be customized based on patient's performance and goals.   Yoga completed today by Alberteen Sam, CEP.  Will review workloads with patient next visit as today was yoga day  only.   Dr. Emily Filbert is Medical Director for [place].  Dr. Ottie Glazier is Medical Director for [facility] Pulmonary Rehabilitation.

## 2022-05-14 ENCOUNTER — Other Ambulatory Visit: Payer: Self-pay

## 2022-05-18 ENCOUNTER — Encounter: Payer: Self-pay | Admitting: Internal Medicine

## 2022-05-18 ENCOUNTER — Inpatient Hospital Stay: Payer: Medicare Other | Attending: Internal Medicine

## 2022-05-18 ENCOUNTER — Inpatient Hospital Stay (HOSPITAL_BASED_OUTPATIENT_CLINIC_OR_DEPARTMENT_OTHER): Payer: Medicare Other | Admitting: Internal Medicine

## 2022-05-18 ENCOUNTER — Inpatient Hospital Stay: Payer: Medicare Other

## 2022-05-18 DIAGNOSIS — Z79899 Other long term (current) drug therapy: Secondary | ICD-10-CM | POA: Diagnosis not present

## 2022-05-18 DIAGNOSIS — C641 Malignant neoplasm of right kidney, except renal pelvis: Secondary | ICD-10-CM

## 2022-05-18 DIAGNOSIS — Z5112 Encounter for antineoplastic immunotherapy: Secondary | ICD-10-CM | POA: Diagnosis present

## 2022-05-18 DIAGNOSIS — R5382 Chronic fatigue, unspecified: Secondary | ICD-10-CM

## 2022-05-18 DIAGNOSIS — E669 Obesity, unspecified: Secondary | ICD-10-CM

## 2022-05-18 LAB — COMPREHENSIVE METABOLIC PANEL
ALT: 10 U/L (ref 0–44)
AST: 12 U/L — ABNORMAL LOW (ref 15–41)
Albumin: 4 g/dL (ref 3.5–5.0)
Alkaline Phosphatase: 88 U/L (ref 38–126)
Anion gap: 9 (ref 5–15)
BUN: 61 mg/dL — ABNORMAL HIGH (ref 8–23)
CO2: 23 mmol/L (ref 22–32)
Calcium: 10 mg/dL (ref 8.9–10.3)
Chloride: 107 mmol/L (ref 98–111)
Creatinine, Ser: 2.25 mg/dL — ABNORMAL HIGH (ref 0.44–1.00)
GFR, Estimated: 22 mL/min — ABNORMAL LOW (ref >60)
Glucose, Bld: 118 mg/dL — ABNORMAL HIGH (ref 70–99)
Potassium: 4.5 mmol/L (ref 3.5–5.1)
Sodium: 139 mmol/L (ref 135–145)
Total Bilirubin: 0.5 mg/dL (ref 0.3–1.2)
Total Protein: 6.8 g/dL (ref 6.5–8.1)

## 2022-05-18 LAB — CBC WITH DIFFERENTIAL/PLATELET
Abs Immature Granulocytes: 0.02 10*3/uL (ref 0.00–0.07)
Basophils Absolute: 0 10*3/uL (ref 0.0–0.1)
Basophils Relative: 1 %
Eosinophils Absolute: 0.2 10*3/uL (ref 0.0–0.5)
Eosinophils Relative: 3 %
HCT: 37.7 % (ref 36.0–46.0)
Hemoglobin: 11.7 g/dL — ABNORMAL LOW (ref 12.0–15.0)
Immature Granulocytes: 0 %
Lymphocytes Relative: 27 %
Lymphs Abs: 1.6 10*3/uL (ref 0.7–4.0)
MCH: 29.4 pg (ref 26.0–34.0)
MCHC: 31 g/dL (ref 30.0–36.0)
MCV: 94.7 fL (ref 80.0–100.0)
Monocytes Absolute: 0.6 10*3/uL (ref 0.1–1.0)
Monocytes Relative: 10 %
Neutro Abs: 3.5 10*3/uL (ref 1.7–7.7)
Neutrophils Relative %: 59 %
Platelets: 156 10*3/uL (ref 150–400)
RBC: 3.98 MIL/uL (ref 3.87–5.11)
RDW: 14.3 % (ref 11.5–15.5)
WBC: 5.9 10*3/uL (ref 4.0–10.5)
nRBC: 0 % (ref 0.0–0.2)

## 2022-05-18 LAB — TSH: TSH: 2.705 u[IU]/mL (ref 0.350–4.500)

## 2022-05-18 LAB — VITAMIN D 25 HYDROXY (VIT D DEFICIENCY, FRACTURES): Vit D, 25-Hydroxy: 69.35 ng/mL (ref 30–100)

## 2022-05-18 MED ORDER — SODIUM CHLORIDE 0.9 % IV SOLN
Freq: Once | INTRAVENOUS | Status: AC
  Filled 2022-05-18: qty 250

## 2022-05-18 MED ORDER — SODIUM CHLORIDE 0.9 % IV SOLN
200.0000 mg | Freq: Once | INTRAVENOUS | Status: AC
  Administered 2022-05-18: 200 mg via INTRAVENOUS
  Filled 2022-05-18: qty 8

## 2022-05-18 MED ORDER — HEPARIN SOD (PORK) LOCK FLUSH 100 UNIT/ML IV SOLN
500.0000 [IU] | Freq: Once | INTRAVENOUS | Status: DC | PRN
  Filled 2022-05-18: qty 5

## 2022-05-18 NOTE — Progress Notes (Signed)
No concerns. 

## 2022-05-18 NOTE — Assessment & Plan Note (Addendum)
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  Tolerating [name] fairly well except for joint pain/fatigue.   # Proceed with [name] #8 today. Labs today reviewed;  acceptable for treatment today. July 2023- TSH-NL.  Awaiting TSH from today.  # Intermittently- elevated calcium- 10.4; STOP vit D 50k; today calcium is 10.0.  Awaiting vit D levels from today. STABLE.  #Anemia: Hemoglobin around 12 overall stable/improving; on PO iron every other day. STABLE.    # Bil Knee pain:?  Secondary to [name] s/p steroids  X 4-5 days- resolved; monitor closely- STABLE.   #CKD stage IV- GFR ~22; Continue enough hydration. Monitor closely. [s/p [facility] evaluation on June 1st, 2023]- STABLE.   #Diabetes: Blood sugars 118; patient on metformin on glimepiride [as per nephrology]  # Fatigue: intermittently- CARE program.   # IV ACCESS: PIV.    # DISPOSITION:  # [name] today  # follow up in 3 weeks/Tuesday or Wednesday - MD; labs- cbc/cmp; [name]- Dr.B

## 2022-05-18 NOTE — Progress Notes (Signed)
Nutrition Follow-up:  Patient with renal cell carcinoma.  Patient receiving keytruda  Met with patient during infusion.  Patient reports that her appetite is good.  Trying to be mindful of sodium and sugar in foods.  Patient is participating in the CARE program.  Denies nutrition impact symptoms    Medications: reviewed  Labs: reviewed  Anthropometrics:   Weight 177 lb 9.6 today  165 lb on 6/9 173 lb on 4/28 170 lb on 2/6 199 lb on 6/14   NUTRITION DIAGNOSIS: Food and nutrition related knowledge deficit improved   INTERVENTION:  Patient to continue focus on foods to help protect kidney health.  Contact information provided    NEXT VISIT: no follow-up RD available if needed  Kelvon Giannini B. Zenia Resides, [place], [place] Registered Dietitian [phone]

## 2022-05-18 NOTE — Patient Instructions (Signed)
[facility]  Discharge Instructions: Thank you for choosing [facility] to provide your oncology and hematology care.  If you have a lab appointment with the Cancer Center, please go directly to the Cancer Center and check in at the registration area.  Wear comfortable clothing and clothing appropriate for easy access to any Portacath or PICC line.   We strive to give you quality time with your provider. You may need to reschedule your appointment if you arrive late (15 or more minutes).  Arriving late affects you and other patients whose appointments are after yours.  Also, if you miss three or more appointments without notifying the office, you may be dismissed from the clinic at the provider's discretion.      For prescription refill requests, have your pharmacy contact our office and allow 72 hours for refills to be completed.    Today you received the following chemotherapy and/or immunotherapy agents [name]      To help prevent nausea and vomiting after your treatment, we encourage you to take your nausea medication as directed.  BELOW ARE SYMPTOMS THAT SHOULD BE REPORTED IMMEDIATELY: *FEVER GREATER THAN 100.4 F (38 C) OR HIGHER *CHILLS OR SWEATING *NAUSEA AND VOMITING THAT IS NOT CONTROLLED WITH YOUR NAUSEA MEDICATION *UNUSUAL SHORTNESS OF BREATH *UNUSUAL BRUISING OR BLEEDING *URINARY PROBLEMS (pain or burning when urinating, or frequent urination) *BOWEL PROBLEMS (unusual diarrhea, constipation, pain near the anus) TENDERNESS IN MOUTH AND THROAT WITH OR WITHOUT PRESENCE OF ULCERS (sore throat, sores in mouth, or a toothache) UNUSUAL RASH, SWELLING OR PAIN  UNUSUAL VAGINAL DISCHARGE OR ITCHING   Items with * indicate a potential emergency and should be followed up as soon as possible or go to the Emergency Department if any problems should occur.  Please show the CHEMOTHERAPY ALERT CARD or IMMUNOTHERAPY ALERT CARD at check-in to  the Emergency Department and triage nurse.  Should you have questions after your visit or need to cancel or reschedule your appointment, please contact [facility]  [phone] and follow the prompts.  Office hours are 8:00 a.m. to 4:30 p.m. Monday - Friday. Please note that voicemails left after 4:00 p.m. may not be returned until the following business day.  We are closed weekends and major holidays. You have access to a nurse at all times for urgent questions. Please call the main number to the clinic [phone] and follow the prompts.  For any non-urgent questions, you may also contact your provider using MyChart. We now offer e-Visits for anyone 18 and older to request care online for non-urgent symptoms. For details visit [email or web address].   Also download the MyChart app! Go to the app store, search "MyChart", open the app, select [facility], and log in with your MyChart username and password.  Masks are optional in the cancer centers. If you would like for your care team to wear a mask while they are taking care of you, please let them know. For doctor visits, patients may have with them one support person who is at least 74 years old. At this time, visitors are not allowed in the infusion area.   

## 2022-05-18 NOTE — Progress Notes (Signed)
[facility] NOTE  Patient Care Team: Gabrielle Harrier, MD as PCP - General (Internal Medicine) Gabrielle Sickle, MD as Consulting Physician (Oncology)  CHIEF COMPLAINTS/PURPOSE OF CONSULTATION: Kidney cancer   Oncology History Overview Note  UMOR  Tumor Focality: Unifocal  Tumor Size: Greatest dimension 9.3 cm  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  Histologic Grade (WHO / ISUP Grade): G3, nucleoli conspicuous and  eosinophilic at 176 X magnification  Tumor Extent: Extends into major vein (segmental branch adjacent to  renal sinus)  Sarcomatoid Features: Not identified  Rhabdoid Features: Not identified  Tumor Necrosis: Present, microscopic and macroscopic, 20%   MARGINS  Margin Status: All margins negative for invasive carcinoma   REGIONAL LYMPH NODES  Regional Lymph Node Status: Not applicable (no regional lymph nodes  submitted or found)   DISTANT METASTASIS  Distant Site(s) Involved, if applicable: Not applicable   PATHOLOGIC STAGE CLASSIFICATION (pTNM, AJCC 8th Edition):  TNM Descriptors: Applicable  HY0V  pN - Not assigned (no lymph nodes submitted or found)  pM - Not applicable   # PX1G- stage III; G-3; no sarcomatoid features [right nephrectomy- Gabrielle Savage].  # FEB 14th, 2023- CT AP [GI; [facility]]- . Large mass exophytic extending from lower to mid pole of the RIGHT kidney consistent with renal neoplasm. [Dr.Sninski; [facility]]  # FEB 2023- Right lower lobe subpleural 4 mm lung nodule; bone survey negative  #April 7th, 2023- On adjuvant [name]    # CKD III [ sec to HTN > 20 years; none now]; Anemia- EGD/colo- 2023- [[facility]]; Gabrielle Savage [No CAD]   Clear cell renal cell carcinoma, right ([place])  12/12/2021 Initial Diagnosis   Clear cell renal cell carcinoma, right ([place])   12/18/2021 - 04/05/2022 Chemotherapy   Patient is on Treatment Plan : RENAL CELL Pembrolizumab (200) q21d     12/18/2021 -  Chemotherapy   Patient is on  Treatment Plan : kidney adjuvant Pembrolizumab (200) q21d     01/08/2022 Cancer Staging   Staging form: Kidney, AJCC 8th Edition - Pathologic: Stage III (pT3a, pN0, cM0) - Signed by Gabrielle Sickle, MD on 01/08/2022    HISTORY OF PRESENTING ILLNESS: Ambulating independently.    Gabrielle Savage 76 y.o.  female with history of stage III kidney cancer; CKD stage III currently on adjuvant Gabrielle Savage is here for follow-up.    Patient is fatigued-evaluated at the care program.  Denies any worsening shortness of breath or cough.  Denies any worsening joint pains.  No nausea no vomiting.  Review of Systems  Constitutional:  Positive for malaise/fatigue. Negative for chills and fever.  HENT:  Negative for nosebleeds and sore throat.   Eyes:  Negative for double vision.  Respiratory:  Negative for cough, hemoptysis, sputum production and wheezing.   Cardiovascular:  Negative for chest pain, palpitations, orthopnea and leg swelling.  Gastrointestinal:  Negative for abdominal pain, blood in stool, constipation, diarrhea, heartburn, melena, nausea and vomiting.  Genitourinary:  Negative for dysuria, frequency and urgency.  Musculoskeletal:  Positive for back pain and joint pain.  Skin: Negative.  Negative for itching and rash.  Neurological:  Negative for dizziness, tingling, focal weakness, weakness and headaches.  Endo/Heme/Allergies:  Does not bruise/bleed easily.  Psychiatric/Behavioral:  Negative for depression. The patient is not nervous/anxious and does not have insomnia.      MEDICAL HISTORY:  Past Medical History:  Diagnosis Date   Aortic atherosclerosis (HCC)    Arthritis    B12 deficiency    Bradycardia  CAD (coronary artery disease)    Carotid atherosclerosis    CKD (chronic kidney disease), stage III (HCC)    Diastolic dysfunction 46/96/2952   a.)  TTE 09/03/2021: EF 63%; no RWMAs; LA mildly enlarged; trivial TR/PR, mild AR, moderate MR; G1DD.   DOE (dyspnea on exertion)     Fatigue    Gout    HLD (hyperlipidemia)    Hypertension    IDA (iron deficiency anemia)    Iliotibial band syndrome, left leg    MDD (major depressive disorder)    OAB (overactive bladder)    Obesity    PAC (premature atrial contraction)    noted on Holter   PONV (postoperative nausea and vomiting)    a.) single episode in 03/2013   PVC (premature ventricular contraction)    noted on Holter   Right kidney mass 10/27/2021   a.) CT 10/27/2021 -- mass measuring 8.3 x 6.8 x 8.1 cm   Sensorineural hearing loss (SNHL) of both ears    SUI (stress urinary incontinence, female)    SVT (supraventricular tachycardia) ([place])    noted on Holter   T2DM (type 2 diabetes mellitus) ([place])    Uterovaginal prolapse    Vitamin D deficiency    Voiding dysfunction     SURGICAL HISTORY: Past Surgical History:  Procedure Laterality Date   ABDOMINAL HYSTERECTOMY     partial   BLADDER SUSPENSION     CHOLECYSTECTOMY     COLONOSCOPY N/A 07/25/2017   Procedure: COLONOSCOPY;  Surgeon: [name], MD;  Location: [facility] ENDOSCOPY;  Service: Endoscopy;  Laterality: N/A;   COLONOSCOPY WITH PROPOFOL N/A 10/19/2021   Procedure: COLONOSCOPY WITH PROPOFOL;  Surgeon: [name], MD;  Location: [facility] ENDOSCOPY;  Service: Endoscopy;  Laterality: N/A;  DM   ESOPHAGOGASTRODUODENOSCOPY (EGD) WITH PROPOFOL N/A 10/19/2021   Procedure: ESOPHAGOGASTRODUODENOSCOPY (EGD) WITH PROPOFOL;  Surgeon: [name], MD;  Location: [facility] ENDOSCOPY;  Service: Endoscopy;  Laterality: N/A;   JOINT REPLACEMENT Bilateral    PARTIAL KNEE REPLACEMENT   KNEE SURGERY     LAPAROSCOPIC NEPHRECTOMY, HAND ASSISTED Right 11/20/2021   Procedure: HAND ASSISTED LAPAROSCOPIC RADICAL NEPHRECTOMY;  Surgeon: Billey Co, MD;  Location: [facility] ORS;  Service: Urology;  Laterality: Right;   PARATHYROIDECTOMY      SOCIAL HISTORY: Social History   Socioeconomic History   Marital status: Married    Spouse name: Not on file    Number of children: Not on file   Years of education: Not on file   Highest education level: Not on file  Occupational History   Not on file  Tobacco Use   Smoking status: Never    Passive exposure: Never   Smokeless tobacco: Never  Vaping Use   Vaping Use: Never used  Substance and Sexual Activity   Alcohol use: Not Currently    Comment: occ   Drug use: No   Sexual activity: Not Currently  Other Topics Concern   Not on file  Social History Narrative   Lives in [place] - close hospital; with husband; 3 biological children [[place]; [place]; [place]]. Never smoked; glass of wine rare. 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    Social Determinants of Health   Financial Resource Strain: Low Risk  (12/07/2021)   Overall Financial Resource Strain (CARDIA)    Difficulty of Paying Living Expenses: Not hard at all  Food Insecurity: No Food Insecurity (12/07/2021)   Hunger Vital Sign    Worried About Running Out of Food in the Last  Year: Never true    [place] in the Last Year: Never true  Transportation Needs: No Transportation Needs (12/07/2021)   PRAPARE - Hydrologist (Medical): No    Lack of Transportation (Non-Medical): No  Physical Activity: Insufficiently Active (12/07/2021)   Exercise Vital Sign    Days of Exercise per Week: 2 days    Minutes of Exercise per Session: 30 min  Stress: No Stress Concern Present (12/07/2021)   [place]    Feeling of Stress : Only a little  Social Connections: Moderately Integrated (12/07/2021)   Social Connection and Isolation Panel [NHANES]    Frequency of Communication with Friends and Family: Three times a week    Frequency of Social Gatherings with Friends and Family: Three times a week    Attends Religious Services: 1 to 4 times per year    Active Member of Clubs or Organizations: No    Attends Archivist Meetings: Never     Marital Status: Married  Human resources officer Violence: Not At Risk (12/07/2021)   Humiliation, Afraid, Rape, and Kick questionnaire    Fear of Current or Ex-Partner: No    Emotionally Abused: No    Physically Abused: No    Sexually Abused: No    FAMILY HISTORY: Family History  Problem Relation Age of Onset   Heart failure Mother    Stroke Father    Lung cancer Father    Breast cancer Neg Hx     ALLERGIES:  is allergic to codeine, nsaids, and oxycodone.  MEDICATIONS:  Current Outpatient Medications  Medication Sig Dispense Refill   allopurinol (ZYLOPRIM) 100 MG tablet Take 300 mg by mouth every morning. Pt has decreased to '100mg'$      amLODipine (NORVASC) 5 MG tablet Take 5 mg by mouth every morning.     citalopram (CELEXA) 10 MG tablet Take 10 mg by mouth every morning.     irbesartan (AVAPRO) 150 MG tablet Take 150 mg by mouth every morning.     vitamin B-12 (CYANOCOBALAMIN) 1000 MCG tablet Take 1,000 mcg by mouth daily.     docusate sodium (COLACE) 100 MG capsule Take 1 capsule (100 mg total) by mouth 2 (two) times daily. (Patient not taking: Reported on 04/26/2022) 20 capsule 0   ergocalciferol (VITAMIN D2) 50000 units capsule Take 50,000 Units by mouth every Saturday.     ferrous sulfate 325 (65 FE) MG tablet Take 325 mg by mouth every other day. (Patient not taking: Reported on 05/10/2022)     No current facility-administered medications for this visit.      Marland Kitchen  PHYSICAL EXAMINATION:  Vitals:   05/18/22 0908  BP: (!) 129/59  Pulse: (!) 58  Temp: (!) 97.5 F (36.4 C)  SpO2: 100%   Filed Weights   05/18/22 0908  Weight: 177 lb 9.6 oz (80.6 kg)    Physical Exam Vitals and nursing note reviewed.  HENT:     Head: Normocephalic and atraumatic.     Mouth/Throat:     Pharynx: Oropharynx is clear.  Eyes:     Extraocular Movements: Extraocular movements intact.     Pupils: Pupils are equal, round, and reactive to light.  Cardiovascular:     Rate and Rhythm: Normal  rate and regular rhythm.  Pulmonary:     Comments: Decreased breath sounds bilaterally.  Abdominal:     Palpations: Abdomen is soft.  Musculoskeletal:  General: Normal range of motion.     Cervical back: Normal range of motion.  Skin:    General: Skin is warm.  Neurological:     General: No focal deficit present.     Mental Status: She is alert and oriented to person, place, and time.  Psychiatric:        Behavior: Behavior normal.        Judgment: Judgment normal.      LABORATORY DATA:  I have reviewed the data as listed Lab Results  Component Value Date   WBC 5.9 05/18/2022   HGB 11.7 (L) 05/18/2022   HCT 37.7 05/18/2022   MCV 94.7 05/18/2022   PLT 156 05/18/2022   Recent Labs    04/05/22 0849 04/26/22 0903 05/18/22 0856  NA 139 139 139  K 4.5 4.1 4.5  CL 111 110 107  CO2 20* 19* 23  GLUCOSE 120* 110* 118*  BUN 59* 55* 61*  CREATININE 2.09* 2.04* 2.25*  CALCIUM 10.2 10.4* 10.0  GFRNONAA 24* 25* 22*  PROT 6.9 7.2 6.8  ALBUMIN 4.0 4.2 4.0  AST 11* 23 12*  ALT '10 31 10  '$ ALKPHOS 88 94 88  BILITOT 0.6 0.3 0.5    RADIOGRAPHIC STUDIES: I have personally reviewed the radiological images as listed and agreed with the findings in the report. No results found.  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  Tolerating [name] fairly well except for joint pain/fatigue.   # Proceed with [name] #8 today. Labs today reviewed;  acceptable for treatment today. July 2023- TSH-NL.  Awaiting TSH from today.  # Intermittently- elevated calcium- 10.4; STOP vit D 50k; today calcium is 10.0.  Awaiting vit D levels from today. STABLE.  #Anemia: Hemoglobin around 12 overall stable/improving; on PO iron every other day. STABLE.    # Bil Knee pain:?  Secondary to [name] s/p steroids  X 4-5 days- resolved; monitor closely- STABLE.   #CKD stage IV- GFR ~22; Continue enough hydration. Monitor closely. [s/p  [facility] evaluation on June 1st, 2023]- STABLE.   #Diabetes: Blood sugars 118; patient on metformin on glimepiride [as per nephrology]  # Fatigue: intermittently- CARE program.   # IV ACCESS: PIV.    # DISPOSITION:  # [name] today  # follow up in 3 weeks/Tuesday or Wednesday - MD; labs- cbc/cmp; Gabrielle Savage- Dr.B    All questions were answered. The patient knows to call the clinic with any problems, questions or concerns.     Gabrielle Sickle, MD 05/18/2022 9:26 AM

## 2022-05-19 ENCOUNTER — Other Ambulatory Visit: Payer: Self-pay | Admitting: Internal Medicine

## 2022-05-19 DIAGNOSIS — Z1231 Encounter for screening mammogram for malignant neoplasm of breast: Secondary | ICD-10-CM

## 2022-05-25 ENCOUNTER — Encounter: Payer: Medicare Other | Attending: Nurse Practitioner | Admitting: *Deleted

## 2022-05-25 DIAGNOSIS — C641 Malignant neoplasm of right kidney, except renal pelvis: Secondary | ICD-10-CM | POA: Insufficient documentation

## 2022-05-25 NOTE — Progress Notes (Signed)
Daily Session Note  Patient Details  Name: Gabrielle Savage MRN: [id number] Date of Birth: 1946-01-01 Referring Provider:   [name] Cancer Associated Rehabilitation & Exercise from 05/11/2022 in [facility] Cardiac and Pulmonary Rehab  Referring Provider Charlaine Dalton MD       Encounter Date: 05/25/2022  Check In:  Session Check In - 05/25/22 1244       Check-In   Supervising physician immediately available to respond to emergencies See telemetry face sheet for immediately available ER MD    Location [facility]-Cardiac & Pulmonary Rehab    Staff Present Coralie Keens, MS, ASCM CEP, Exercise Physiologist;Thaila Bottoms Luan Pulling, MA, RCEP, CCRP, CCET    Virtual Visit No    Medication changes reported     No    Fall or balance concerns reported    No    Warm-up and Cool-down Performed on first and last piece of equipment    Resistance Training Performed Yes    VAD Patient? No    PAD/SET Patient? No      Pain Assessment   Currently in Pain? No/denies                Social History   Tobacco Use  Smoking Status Never   Passive exposure: Never  Smokeless Tobacco Never    Goals Met:  Proper associated with RPD/PD & O2 Sat Independence with exercise equipment Exercise tolerated well No report of concerns or symptoms today Strength training completed today  Goals Unmet:  Not Applicable  Comments: Pt able to follow exercise prescription today without complaint.  Will continue to monitor for progression.    Dr. Emily Filbert is Medical Director for [place].  Dr. Ottie Glazier is Medical Director for [facility] Pulmonary Rehabilitation.

## 2022-05-27 DIAGNOSIS — C641 Malignant neoplasm of right kidney, except renal pelvis: Secondary | ICD-10-CM

## 2022-05-27 NOTE — Progress Notes (Signed)
Daily Session Note  Patient Details  Name: Gabrielle Savage MRN: [id number] Date of Birth: 07/28/1946 Referring Provider:   [name] Cancer Associated Rehabilitation & Exercise from 05/11/2022 in [facility] Cardiac and Pulmonary Rehab  Referring Provider Charlaine Dalton MD       Encounter Date: 05/27/2022  Check In:  Session Check In - 05/27/22 1346       Check-In   Supervising physician immediately available to respond to emergencies See telemetry face sheet for immediately available ER MD    Location [facility]-Cardiac & Pulmonary Rehab    Staff Present Antionette Fairy, BS, Exercise Physiologist;Susanne Bice, RN, BSN, CCRP    Virtual Visit No    Medication changes reported     No    Fall or balance concerns reported    No    Warm-up and Cool-down Performed on first and last piece of equipment    Resistance Training Performed Yes    VAD Patient? No    PAD/SET Patient? No      Pain Assessment   Currently in Pain? No/denies                Social History   Tobacco Use  Smoking Status Never   Passive exposure: Never  Smokeless Tobacco Never    Goals Met:  Independence with exercise equipment Exercise tolerated well No report of concerns or symptoms today Strength training completed today  Goals Unmet:  Not Applicable  Comments: Pt able to follow exercise prescription today without complaint.  Will continue to monitor for progression.    Dr. Emily Filbert is Medical Director for [place].  Dr. Ottie Glazier is Medical Director for [facility] Pulmonary Rehabilitation.

## 2022-06-01 DIAGNOSIS — C641 Malignant neoplasm of right kidney, except renal pelvis: Secondary | ICD-10-CM

## 2022-06-01 NOTE — Progress Notes (Signed)
Daily Session Note  Patient Details  Name: Gabrielle Savage MRN: [id number] Date of Birth: [birth year] Referring Provider:   [name] Cancer Associated Rehabilitation & Exercise from 05/11/2022 in [facility] Cardiac and Pulmonary Rehab  Referring Provider Charlaine Dalton MD       Encounter Date: 06/01/2022  Check In:  Session Check In - 06/01/22 1231       Check-In   Supervising physician immediately available to respond to emergencies See telemetry face sheet for immediately available ER MD    Location [facility]-Cardiac & Pulmonary Rehab    Staff Present Antionette Fairy, BS, Exercise Physiologist;Jessica [place], MA, RCEP, CCRP, CCET    Virtual Visit No    Medication changes reported     No    Fall or balance concerns reported    No    Warm-up and Cool-down Performed on first and last piece of equipment    Resistance Training Performed Yes    VAD Patient? No    PAD/SET Patient? No      Pain Assessment   Currently in Pain? No/denies    Multiple Pain Sites No                Social History   Tobacco Use  Smoking Status Never   Passive exposure: Never  Smokeless Tobacco Never    Goals Met:  Independence with exercise equipment Exercise tolerated well No report of concerns or symptoms today  Goals Unmet:  Not Applicable  Comments: Pt able to follow exercise prescription today without complaint.  Will continue to monitor for progression.    Dr. Emily Filbert is Medical Director for [place].  Dr. Ottie Glazier is Medical Director for [facility] Pulmonary Rehabilitation.

## 2022-06-03 DIAGNOSIS — C641 Malignant neoplasm of right kidney, except renal pelvis: Secondary | ICD-10-CM

## 2022-06-03 NOTE — Progress Notes (Signed)
Daily Session Note  Patient Details  Name: Gabrielle Savage MRN: [id number] Date of Birth: May 03, 1946 Referring Provider:   [name] Cancer Associated Rehabilitation & Exercise from 05/11/2022 in [facility] Cardiac and Pulmonary Rehab  Referring Provider Charlaine Dalton MD       Encounter Date: 06/03/2022  Check In:  Session Check In - 06/03/22 1232       Check-In   Supervising physician immediately available to respond to emergencies See telemetry face sheet for immediately available ER MD    Location [facility]-Cardiac & Pulmonary Rehab    Staff Present Antionette Fairy, BS, Exercise Physiologist;Jessica [place], MA, RCEP, CCRP, Marylynn Pearson, MS, ASCM CEP, Exercise Physiologist    Virtual Visit No    Medication changes reported     No    Fall or balance concerns reported    No    Tobacco Cessation No Change    Warm-up and Cool-down Performed on first and last piece of equipment    Resistance Training Performed Yes    VAD Patient? No    PAD/SET Patient? No      Pain Assessment   Currently in Pain? No/denies    Multiple Pain Sites No                Social History   Tobacco Use  Smoking Status Never   Passive exposure: Never  Smokeless Tobacco Never    Goals Met:  Independence with exercise equipment Exercise tolerated well No report of concerns or symptoms today  Goals Unmet:  Not Applicable  Comments: Pt able to follow exercise prescription today without complaint.  Will continue to monitor for progression.    Dr. Emily Filbert is Medical Director for [place].  Dr. Ottie Glazier is Medical Director for [facility] Pulmonary Rehabilitation.

## 2022-06-07 ENCOUNTER — Telehealth: Payer: Self-pay | Admitting: *Deleted

## 2022-06-07 NOTE — Telephone Encounter (Signed)
Patient called reporting that her rapid COVID test is negative, but the final report will not be available until after 4 PM tomorrow so she is asking if she should cancel her appointment tomorrow. Please return her call

## 2022-06-08 ENCOUNTER — Inpatient Hospital Stay: Payer: Medicare Other | Admitting: Internal Medicine

## 2022-06-08 ENCOUNTER — Inpatient Hospital Stay: Payer: Medicare Other

## 2022-06-09 ENCOUNTER — Telehealth: Payer: Self-pay | Admitting: Internal Medicine

## 2022-06-09 NOTE — Telephone Encounter (Signed)
Pt left VM to reschedule missed appts 06/08/22

## 2022-06-09 NOTE — Telephone Encounter (Signed)
Patient called to let us know she took her COVID test and it is negative. Please advise reschedule for [place].

## 2022-06-10 ENCOUNTER — Other Ambulatory Visit: Payer: Self-pay | Admitting: Internal Medicine

## 2022-06-10 DIAGNOSIS — C641 Malignant neoplasm of right kidney, except renal pelvis: Secondary | ICD-10-CM

## 2022-06-10 NOTE — Telephone Encounter (Signed)
Per Dr. Jacinto Reap appointment request updated for 10/3.

## 2022-06-10 NOTE — Progress Notes (Signed)
Daily Session Note  Patient Details  Name: Gabrielle Savage MRN: [id number] Date of Birth: 26-Dec-1945 Referring Provider:   [name] Cancer Associated Rehabilitation & Exercise from 05/11/2022 in [facility] Cardiac and Pulmonary Rehab  Referring Provider Charlaine Dalton MD       Encounter Date: 06/10/2022  Check In:  Session Check In - 06/10/22 1248       Check-In   Supervising physician immediately available to respond to emergencies See telemetry face sheet for immediately available ER MD    Location [facility]-Cardiac & Pulmonary Rehab    Staff Present Alberteen Sam, MA, RCEP, CCRP, Marylynn Pearson, MS, ASCM CEP, Exercise Physiologist    Virtual Visit No    Medication changes reported     No    Fall or balance concerns reported    No    Warm-up and Cool-down Performed on first and last piece of equipment    Resistance Training Performed Yes   yoga   VAD Patient? No    PAD/SET Patient? No                Social History   Tobacco Use  Smoking Status Never   Passive exposure: Never  Smokeless Tobacco Never    Goals Met:  Proper associated with RPD/PD & O2 Sat Exercise tolerated well No report of concerns or symptoms today Strength training completed today  Goals Unmet:  Not Applicable  Comments: Pt able to follow exercise prescription today without complaint.  Will continue to monitor for progression.   Yoga completed today.   Dr. Emily Filbert is Medical Director for [place].  Dr. Ottie Glazier is Medical Director for [facility] Pulmonary Rehabilitation.

## 2022-06-10 NOTE — Telephone Encounter (Signed)
Pt came to center to reschedule appt. Told her we would wait for Dr. B to update appt requests.

## 2022-06-11 ENCOUNTER — Ambulatory Visit
Admission: RE | Admit: 2022-06-11 | Discharge: 2022-06-11 | Disposition: A | Discharge status: Home / Self Care | Payer: Medicare Other | Source: Ambulatory Visit | Attending: Internal Medicine | Admitting: Internal Medicine

## 2022-06-11 DIAGNOSIS — Z1231 Encounter for screening mammogram for malignant neoplasm of breast: Secondary | ICD-10-CM | POA: Diagnosis not present

## 2022-06-15 ENCOUNTER — Encounter: Payer: Medicare Other | Attending: Nurse Practitioner | Admitting: *Deleted

## 2022-06-15 DIAGNOSIS — C641 Malignant neoplasm of right kidney, except renal pelvis: Secondary | ICD-10-CM | POA: Insufficient documentation

## 2022-06-15 NOTE — Progress Notes (Signed)
Daily Session Note  Patient Details  Name: Gabrielle Savage MRN: [id number] Date of Birth: 12/17/1945 Referring Provider:   [name] Cancer Associated Rehabilitation & Exercise from 05/11/2022 in [facility] Cardiac and Pulmonary Rehab  Referring Provider Charlaine Dalton MD       Encounter Date: 06/15/2022  Check In:  Session Check In - 06/15/22 1219       Check-In   Supervising physician immediately available to respond to emergencies See telemetry face sheet for immediately available ER MD    Location [facility]-Cardiac & Pulmonary Rehab    Staff Present Alberteen Sam, MA, RCEP, CCRP, Marylynn Pearson, MS, ASCM CEP, Exercise Physiologist    Virtual Visit No    Medication changes reported     No    Fall or balance concerns reported    No    Warm-up and Cool-down Performed on first and last piece of equipment    Resistance Training Performed Yes    VAD Patient? No    PAD/SET Patient? No      Pain Assessment   Currently in Pain? No/denies                Social History   Tobacco Use  Smoking Status Never   Passive exposure: Never  Smokeless Tobacco Never    Goals Met:  Proper associated with RPD/PD & O2 Sat Independence with exercise equipment Exercise tolerated well No report of concerns or symptoms today Strength training completed today  Goals Unmet:  Not Applicable  Comments: Pt able to follow exercise prescription today without complaint.  Will continue to monitor for progression.   Patient left exercise session early due to knee pain. She was advised to call her doctor if the pain continued as it has been going on for a while.  Dr. Emily Filbert is Medical Director for [place].  Dr. Ottie Glazier is Medical Director for [facility] Pulmonary Rehabilitation.

## 2022-06-17 ENCOUNTER — Ambulatory Visit (INDEPENDENT_AMBULATORY_CARE_PROVIDER_SITE_OTHER): Payer: Medicare Other | Admitting: Urology

## 2022-06-17 VITALS — BP 106/68 | Ht 62.0 in | Wt 175.0 lb

## 2022-06-17 DIAGNOSIS — Z85528 Personal history of other malignant neoplasm of kidney: Secondary | ICD-10-CM

## 2022-06-17 DIAGNOSIS — C641 Malignant neoplasm of right kidney, except renal pelvis: Secondary | ICD-10-CM

## 2022-06-17 NOTE — Progress Notes (Signed)
   06/17/2022 2:13 PM   Gabrielle Savage August 31, 1946 [id number]  Reason for visit: Follow up kidney cancer, CKD  HPI: 76 year old female who presented with a 8cm right renal mass worrisome for RCC, and some possible small retroperitoneal lymph nodes that were indeterminate, as well as an indeterminate 4 mm right lower lobe subpleural nodule.  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  She follows with Dr. Rogue Bussing with oncology, and has been receiving adjuvant [name].  She has not yet had repeat surveillance imaging, and I went ahead and ordered a CT chest abdomen pelvis without contrast for routine 27-month surveillance.  I reviewed the outside notes from oncology.  Renal function postop was initially stable with a creatinine of 1.46(eGFR 38) from creatinine of 1.4 preop, but renal function declined over the next few months and has stabilized with a creatinine of 2.2, EGFR 23.  She follows closely with nephrology..  We discussed importance of adequate hydration, blood pressure control, and avoiding NSAIDs.  Follow-up CT chest abdomen and pelvis Continue oncology follow-up RTC 6 months  Billey Co, MD  [facility] [address], [place], [place] [address] [phone]

## 2022-06-17 NOTE — Progress Notes (Signed)
Daily Session Note  Patient Details  Name: Gabrielle Savage MRN: [id number] Date of Birth: 1945-11-03 Referring Provider:   [name] Cancer Associated Rehabilitation & Exercise from 05/11/2022 in [facility] Cardiac and Pulmonary Rehab  Referring Provider Charlaine Dalton MD       Encounter Date: 06/17/2022  Check In:  Session Check In - 06/17/22 1237       Check-In   Supervising physician immediately available to respond to emergencies See telemetry face sheet for immediately available ER MD    Location [facility]-Cardiac & Pulmonary Rehab    Staff Present Antionette Fairy, BS, Exercise Physiologist;Susanne Bice, RN, BSN, CCRP    Virtual Visit No    Medication changes reported     No    Fall or balance concerns reported    No    Tobacco Cessation No Change    Warm-up and Cool-down Performed on first and last piece of equipment    Resistance Training Performed Yes    VAD Patient? No    PAD/SET Patient? No      Pain Assessment   Currently in Pain? No/denies    Multiple Pain Sites No                Social History   Tobacco Use  Smoking Status Never   Passive exposure: Never  Smokeless Tobacco Never    Goals Met:  Independence with exercise equipment Exercise tolerated well No report of concerns or symptoms today  Goals Unmet:  Not Applicable  Comments: Pt able to follow exercise prescription today without complaint.  Will continue to monitor for progression.    Dr. Emily Filbert is Medical Director for [place].  Dr. Ottie Glazier is Medical Director for [facility] Pulmonary Rehabilitation.

## 2022-06-18 ENCOUNTER — Other Ambulatory Visit: Payer: Self-pay

## 2022-06-29 ENCOUNTER — Ambulatory Visit: Payer: Medicare Other | Admitting: Internal Medicine

## 2022-06-29 ENCOUNTER — Other Ambulatory Visit: Payer: Medicare Other

## 2022-06-29 ENCOUNTER — Ambulatory Visit: Payer: Medicare Other

## 2022-07-01 DIAGNOSIS — C641 Malignant neoplasm of right kidney, except renal pelvis: Secondary | ICD-10-CM

## 2022-07-01 NOTE — Progress Notes (Signed)
Daily Session Note  Patient Details  Name: Gabrielle Savage MRN: [id number] Date of Birth: [birth year] Referring Provider:   [name] Cancer Associated Rehabilitation & Exercise from 05/11/2022 in [facility] Cardiac and Pulmonary Rehab  Referring Provider Charlaine Dalton MD       Encounter Date: 07/01/2022  Check In:  Session Check In - 07/01/22 1243       Check-In   Supervising physician immediately available to respond to emergencies See telemetry face sheet for immediately available ER MD    Location [facility]-Cardiac & Pulmonary Rehab    Staff Present Antionette Fairy, BS, Exercise Physiologist;Kara Eliezer Bottom, MS, ASCM CEP, Exercise Physiologist    Virtual Visit No    Medication changes reported     No    Fall or balance concerns reported    No    Tobacco Cessation No Change    Warm-up and Cool-down Performed on first and last piece of equipment    Resistance Training Performed Yes    VAD Patient? No    PAD/SET Patient? No      Pain Assessment   Currently in Pain? No/denies    Multiple Pain Sites No                Social History   Tobacco Use  Smoking Status Never   Passive exposure: Never  Smokeless Tobacco Never    Goals Met:  Independence with exercise equipment Exercise tolerated well No report of concerns or symptoms today  Goals Unmet:  Not Applicable  Comments: Pt able to follow exercise prescription today without complaint.  Will continue to monitor for progression.    Dr. Emily Filbert is Medical Director for [place].  Dr. Ottie Glazier is Medical Director for [facility] Pulmonary Rehabilitation.

## 2022-07-06 ENCOUNTER — Encounter: Payer: Medicare Other | Admitting: *Deleted

## 2022-07-06 ENCOUNTER — Inpatient Hospital Stay: Payer: Medicare Other | Attending: Internal Medicine

## 2022-07-06 ENCOUNTER — Other Ambulatory Visit: Payer: Self-pay | Admitting: Internal Medicine

## 2022-07-06 ENCOUNTER — Inpatient Hospital Stay: Payer: Medicare Other

## 2022-07-06 ENCOUNTER — Inpatient Hospital Stay (HOSPITAL_BASED_OUTPATIENT_CLINIC_OR_DEPARTMENT_OTHER): Payer: Medicare Other | Admitting: Internal Medicine

## 2022-07-06 DIAGNOSIS — Z905 Acquired absence of kidney: Secondary | ICD-10-CM | POA: Insufficient documentation

## 2022-07-06 DIAGNOSIS — C641 Malignant neoplasm of right kidney, except renal pelvis: Secondary | ICD-10-CM

## 2022-07-06 DIAGNOSIS — Z5112 Encounter for antineoplastic immunotherapy: Secondary | ICD-10-CM | POA: Diagnosis not present

## 2022-07-06 DIAGNOSIS — Z79899 Other long term (current) drug therapy: Secondary | ICD-10-CM | POA: Diagnosis not present

## 2022-07-06 LAB — COMPREHENSIVE METABOLIC PANEL
ALT: 14 U/L (ref 0–44)
AST: 14 U/L — ABNORMAL LOW (ref 15–41)
Albumin: 3.9 g/dL (ref 3.5–5.0)
Alkaline Phosphatase: 82 U/L (ref 38–126)
Anion gap: 4 — ABNORMAL LOW (ref 5–15)
BUN: 53 mg/dL — ABNORMAL HIGH (ref 8–23)
CO2: 25 mmol/L (ref 22–32)
Calcium: 9.8 mg/dL (ref 8.9–10.3)
Chloride: 111 mmol/L (ref 98–111)
Creatinine, Ser: 2.14 mg/dL — ABNORMAL HIGH (ref 0.44–1.00)
GFR, Estimated: 24 mL/min — ABNORMAL LOW (ref >60)
Glucose, Bld: 101 mg/dL — ABNORMAL HIGH (ref 70–99)
Potassium: 4.3 mmol/L (ref 3.5–5.1)
Sodium: 140 mmol/L (ref 135–145)
Total Bilirubin: 0.5 mg/dL (ref 0.3–1.2)
Total Protein: 7 g/dL (ref 6.5–8.1)

## 2022-07-06 LAB — CBC WITH DIFFERENTIAL/PLATELET
Abs Immature Granulocytes: 0.02 10*3/uL (ref 0.00–0.07)
Basophils Absolute: 0 10*3/uL (ref 0.0–0.1)
Basophils Relative: 0 %
Eosinophils Absolute: 0.2 10*3/uL (ref 0.0–0.5)
Eosinophils Relative: 4 %
HCT: 36.6 % (ref 36.0–46.0)
Hemoglobin: 11.4 g/dL — ABNORMAL LOW (ref 12.0–15.0)
Immature Granulocytes: 0 %
Lymphocytes Relative: 21 %
Lymphs Abs: 1.2 10*3/uL (ref 0.7–4.0)
MCH: 30.2 pg (ref 26.0–34.0)
MCHC: 31.1 g/dL (ref 30.0–36.0)
MCV: 96.8 fL (ref 80.0–100.0)
Monocytes Absolute: 0.6 10*3/uL (ref 0.1–1.0)
Monocytes Relative: 10 %
Neutro Abs: 3.7 10*3/uL (ref 1.7–7.7)
Neutrophils Relative %: 65 %
Platelets: 166 10*3/uL (ref 150–400)
RBC: 3.78 MIL/uL — ABNORMAL LOW (ref 3.87–5.11)
RDW: 13.6 % (ref 11.5–15.5)
WBC: 5.7 10*3/uL (ref 4.0–10.5)
nRBC: 0 % (ref 0.0–0.2)

## 2022-07-06 MED ORDER — SODIUM CHLORIDE 0.9 % IV SOLN
200.0000 mg | Freq: Once | INTRAVENOUS | Status: AC
  Administered 2022-07-06: 200 mg via INTRAVENOUS
  Filled 2022-07-06: qty 8

## 2022-07-06 MED ORDER — SODIUM CHLORIDE 0.9 % IV SOLN
Freq: Once | INTRAVENOUS | Status: AC
  Filled 2022-07-06: qty 250

## 2022-07-06 NOTE — Progress Notes (Signed)
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  Histologic Grade (WHO / ISUP Grade): G3, nucleoli conspicuous and  eosinophilic at 235 X magnification  Tumor Extent: Extends into major vein (segmental branch adjacent to  renal sinus)  Sarcomatoid Features: Not identified  Rhabdoid Features: Not identified  Tumor Necrosis: Present, microscopic and macroscopic, 20%   MARGINS  Margin Status: All margins negative for invasive carcinoma   REGIONAL LYMPH NODES  Regional Lymph Node Status: Not applicable (no regional lymph nodes  submitted or found)   DISTANT METASTASIS  Distant Site(s) Involved, if applicable: Not applicable   PATHOLOGIC STAGE CLASSIFICATION (pTNM, AJCC 8th Edition):  TNM Descriptors: Applicable  TD3U  pN - Not assigned (no lymph nodes submitted or found)  pM - Not applicable   # KG2R- stage III; G-3; no sarcomatoid features [right nephrectomy- Dr.Sninksi].  # FEB 14th, 2023- CT AP [GI; [facility]]- . Large mass exophytic extending from lower to mid pole of the RIGHT kidney consistent with renal neoplasm. [Dr.Sninski; [facility]]  # FEB 2023- Right lower lobe subpleural 4 mm lung nodule; bone survey negative  #April 7th, 2023- On adjuvant [name]    # CKD III [ sec to HTN > 20 years; none now]; Anemia- EGD/colo- 2023- [[facility]]; Dr.Kowalski [No CAD]   Clear cell renal cell carcinoma, right ([place])  12/12/2021 Initial Diagnosis   Clear cell renal cell carcinoma, right ([place])   12/18/2021 - 04/05/2022 Chemotherapy   Patient is on Treatment Plan : RENAL CELL Pembrolizumab (200) q21d     12/18/2021 -  Chemotherapy   Patient is on  Treatment Plan : kidney adjuvant Pembrolizumab (200) q21d     01/08/2022 Cancer Staging   Staging form: Kidney, AJCC 8th Edition - Pathologic: Stage III (pT3a, pN0, cM0) - Signed by Cammie Sickle, MD on 01/08/2022    HISTORY OF PRESENTING ILLNESS: Ambulating independently.    Gabrielle Savage 76 y.o.  female with history of stage III kidney cancer; CKD stage III currently on adjuvant Beryle Flock is here for follow-up.    Denies any worsening shortness of breath or cough.  Denies any worsening joint pains.  No nausea no vomiting.  Review of Systems  Constitutional:  Positive for malaise/fatigue. Negative for chills and fever.  HENT:  Negative for nosebleeds and sore throat.   Eyes:  Negative for double vision.  Respiratory:  Negative for cough, hemoptysis, sputum production and wheezing.   Cardiovascular:  Negative for chest pain, palpitations, orthopnea and leg swelling.  Gastrointestinal:  Negative for abdominal pain, blood in stool, constipation, diarrhea, heartburn, melena, nausea and vomiting.  Genitourinary:  Negative for dysuria, frequency and urgency.  Musculoskeletal:  Positive for back pain and joint pain.  Skin: Negative.  Negative for itching and rash.  Neurological:  Negative for dizziness, tingling, focal weakness, weakness and headaches.  Endo/Heme/Allergies:  Does not bruise/bleed easily.  Psychiatric/Behavioral:  Negative for depression. The patient is not nervous/anxious and does not have insomnia.      MEDICAL HISTORY:  Past Medical History:  Diagnosis Date   Aortic atherosclerosis (HCC)    Arthritis    B12 deficiency    Bradycardia    CAD (coronary artery disease)  Carotid atherosclerosis    CKD (chronic kidney disease), stage III (HCC)    Diastolic dysfunction 92/07/9416   a.)  TTE 09/03/2021: EF 63%; no RWMAs; LA mildly enlarged; trivial TR/PR, mild AR, moderate MR; G1DD.   DOE (dyspnea on exertion)    Fatigue    Gout    HLD (hyperlipidemia)     Hypertension    IDA (iron deficiency anemia)    Iliotibial band syndrome, left leg    MDD (major depressive disorder)    OAB (overactive bladder)    Obesity    PAC (premature atrial contraction)    noted on Holter   PONV (postoperative nausea and vomiting)    a.) single episode in 03/2013   PVC (premature ventricular contraction)    noted on Holter   Right kidney mass 10/27/2021   a.) CT 10/27/2021 -- mass measuring 8.3 x 6.8 x 8.1 cm   Sensorineural hearing loss (SNHL) of both ears    SUI (stress urinary incontinence, female)    SVT (supraventricular tachycardia) ([place])    noted on Holter   T2DM (type 2 diabetes mellitus) ([place])    Uterovaginal prolapse    Vitamin D deficiency    Voiding dysfunction     SURGICAL HISTORY: Past Surgical History:  Procedure Laterality Date   ABDOMINAL HYSTERECTOMY     partial   BLADDER SUSPENSION     CHOLECYSTECTOMY     COLONOSCOPY N/A 07/25/2017   Procedure: COLONOSCOPY;  Surgeon: [name], MD;  Location: [facility] ENDOSCOPY;  Service: Endoscopy;  Laterality: N/A;   COLONOSCOPY WITH PROPOFOL N/A 10/19/2021   Procedure: COLONOSCOPY WITH PROPOFOL;  Surgeon: [name], MD;  Location: [facility] ENDOSCOPY;  Service: Endoscopy;  Laterality: N/A;  DM   ESOPHAGOGASTRODUODENOSCOPY (EGD) WITH PROPOFOL N/A 10/19/2021   Procedure: ESOPHAGOGASTRODUODENOSCOPY (EGD) WITH PROPOFOL;  Surgeon: [name], MD;  Location: [facility] ENDOSCOPY;  Service: Endoscopy;  Laterality: N/A;   JOINT REPLACEMENT Bilateral    PARTIAL KNEE REPLACEMENT   KNEE SURGERY     LAPAROSCOPIC NEPHRECTOMY, HAND ASSISTED Right 11/20/2021   Procedure: HAND ASSISTED LAPAROSCOPIC RADICAL NEPHRECTOMY;  Surgeon: Billey Co, MD;  Location: [facility] ORS;  Service: Urology;  Laterality: Right;   PARATHYROIDECTOMY      SOCIAL HISTORY: Social History   Socioeconomic History   Marital status: Married    Spouse name: Not on file   Number of children: Not on file   Years of  education: Not on file   Highest education level: Not on file  Occupational History   Not on file  Tobacco Use   Smoking status: Never    Passive exposure: Never   Smokeless tobacco: Never  Vaping Use   Vaping Use: Never used  Substance and Sexual Activity   Alcohol use: Not Currently    Comment: occ   Drug use: No   Sexual activity: Not Currently  Other Topics Concern   Not on file  Social History Narrative   Lives in [place] - close hospital; with husband; 3 biological children [[place]; [place]; [place]]. Never smoked; glass of wine rare. 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    Social Determinants of Health   Financial Resource Strain: Low Risk  (12/07/2021)   Overall Financial Resource Strain (CARDIA)    Difficulty of Paying Living Expenses: Not hard at all  Food Insecurity: No Food Insecurity (12/07/2021)   Hunger Vital Sign    Worried About Running Out of Food in the Last Year: Never true    Ran  Out of Food in the Last Year: Never true  Transportation Needs: No Transportation Needs (12/07/2021)   PRAPARE - Hydrologist (Medical): No    Lack of Transportation (Non-Medical): No  Physical Activity: Insufficiently Active (12/07/2021)   Exercise Vital Sign    Days of Exercise per Week: 2 days    Minutes of Exercise per Session: 30 min  Stress: No Stress Concern Present (12/07/2021)   [place]    Feeling of Stress : Only a little  Social Connections: Moderately Integrated (12/07/2021)   Social Connection and Isolation Panel [NHANES]    Frequency of Communication with Friends and Family: Three times a week    Frequency of Social Gatherings with Friends and Family: Three times a week    Attends Religious Services: 1 to 4 times per year    Active Member of Clubs or Organizations: No    Attends Archivist Meetings: Never    Marital Status: Married  Human resources officer  Violence: Not At Risk (12/07/2021)   Humiliation, Afraid, Rape, and Kick questionnaire    Fear of Current or Ex-Partner: No    Emotionally Abused: No    Physically Abused: No    Sexually Abused: No    FAMILY HISTORY: Family History  Problem Relation Age of Onset   Heart failure Mother    Stroke Father    Lung cancer Father    Breast cancer Neg Hx     ALLERGIES:  is allergic to codeine, nsaids, and oxycodone.  MEDICATIONS:  Current Outpatient Medications  Medication Sig Dispense Refill   allopurinol (ZYLOPRIM) 100 MG tablet Take 300 mg by mouth every morning. Pt has decreased to '100mg'$      amLODipine (NORVASC) 5 MG tablet Take 5 mg by mouth every morning.     citalopram (CELEXA) 10 MG tablet Take 10 mg by mouth every morning.     ferrous sulfate 325 (65 FE) MG tablet Take 325 mg by mouth every other day.     irbesartan (AVAPRO) 150 MG tablet Take 150 mg by mouth every morning.     vitamin B-12 (CYANOCOBALAMIN) 1000 MCG tablet Take 1,000 mcg by mouth daily.     docusate sodium (COLACE) 100 MG capsule Take 1 capsule (100 mg total) by mouth 2 (two) times daily. (Patient not taking: Reported on 07/06/2022) 20 capsule 0   No current facility-administered medications for this visit.   Facility-Administered Medications Ordered in Other Visits  Medication Dose Route Frequency Provider Last Rate Last Admin   pembrolizumab ([name]) 200 mg in sodium chloride 0.9 % 50 mL chemo infusion  200 mg Intravenous Once Charlaine Dalton R, MD          .  PHYSICAL EXAMINATION:  Vitals:   07/06/22 1000  BP: (!) 164/71  Pulse: (!) 56  Resp: 16  Temp: 98.1 F (36.7 C)   Filed Weights   07/06/22 1000  Weight: 182 lb (82.6 kg)    Physical Exam Vitals and nursing note reviewed.  HENT:     Head: Normocephalic and atraumatic.     Mouth/Throat:     Pharynx: Oropharynx is clear.  Eyes:     Extraocular Movements: Extraocular movements intact.     Pupils: Pupils are equal, round, and  reactive to light.  Cardiovascular:     Rate and Rhythm: Normal rate and regular rhythm.  Pulmonary:     Comments: Decreased breath sounds bilaterally.  Abdominal:  Palpations: Abdomen is soft.  Musculoskeletal:        General: Normal range of motion.     Cervical back: Normal range of motion.  Skin:    General: Skin is warm.  Neurological:     General: No focal deficit present.     Mental Status: She is alert and oriented to person, place, and time.  Psychiatric:        Behavior: Behavior normal.        Judgment: Judgment normal.      LABORATORY DATA:  I have reviewed the data as listed Lab Results  Component Value Date   WBC 5.7 07/06/2022   HGB 11.4 (L) 07/06/2022   HCT 36.6 07/06/2022   MCV 96.8 07/06/2022   PLT 166 07/06/2022   Recent Labs    04/26/22 0903 05/18/22 0856 07/06/22 0942  NA 139 139 140  K 4.1 4.5 4.3  CL 110 107 111  CO2 19* 23 25  GLUCOSE 110* 118* 101*  BUN 55* 61* 53*  CREATININE 2.04* 2.25* 2.14*  CALCIUM 10.4* 10.0 9.8  GFRNONAA 25* 22* 24*  PROT 7.2 6.8 7.0  ALBUMIN 4.2 4.0 3.9  AST 23 12* 14*  ALT '31 10 14  '$ ALKPHOS 94 88 82  BILITOT 0.3 0.5 0.5    RADIOGRAPHIC STUDIES: I have personally reviewed the radiological images as listed and agreed with the findings in the report. MM 3D SCREEN BREAST BILATERAL  Result Date: 06/14/2022 CLINICAL DATA:  Screening. EXAM: DIGITAL SCREENING BILATERAL MAMMOGRAM WITH TOMOSYNTHESIS AND CAD TECHNIQUE: Bilateral screening digital craniocaudal and mediolateral oblique mammograms were obtained. Bilateral screening digital breast tomosynthesis was performed. The images were evaluated with computer-aided detection. COMPARISON:  Previous exam(s). ACR Breast Density Category b: There are scattered areas of fibroglandular density. FINDINGS: There are no findings suspicious for malignancy. IMPRESSION: No mammographic evidence of malignancy. A result letter of this screening mammogram will be mailed directly  to the patient. RECOMMENDATION: Screening mammogram in one year. (Code:SM-B-01Y) BI-RADS CATEGORY  1: Negative. Electronically Signed   By: Audie Pinto M.D.   On: 06/14/2022 10:55    Clear cell renal cell carcinoma, right ([place]) #Stage IIIa-clear-cell renal carcinoma-on adjuvant [name].  Tolerating [name] fairly well except for joint pain/fatigue.   # Proceed with [name] #8 today. Labs today reviewed;  acceptable for treatment today. SEP- 2023- TSH-NL.  Awaiting TSH from today.  # Intermittently- elevated calcium- 10.4; STOP vit D 50k; today calcium is 10.0.  Vit D levels- 69 [SEP 2023]- STABLE.  # Anemia: Hemoglobin around 11-12 overall stable/improving; on PO iron every other day. STABLE.  Will check irons studies;ferritin.   #CKD stage IV- GFR ~24; Continue enough hydration. Monitor closely. [s/p [facility] evaluation on June 1st, 2023]- STABLE.   #Diabetes: Blood sugars 108- patient on metformin on glimepiride [as per nephrology]  # IV ACCESS: PIV.    # DISPOSITION:  # [name] today  # follow up in 3 weeks/Tuesday or Wednesday - MD; labs- cbc/cmp;iron studies; ferritin;  [name]- Dr.B     All questions were answered. The patient knows to call the clinic with any problems, questions or concerns.     Cammie Sickle, MD 07/06/2022 11:11 AM

## 2022-07-06 NOTE — Assessment & Plan Note (Addendum)
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  Tolerating [name] fairly well except for joint pain/fatigue.   # Proceed with [name] #8 today. Labs today reviewed;  acceptable for treatment today. SEP- 2023- TSH-NL.  Awaiting TSH from today.  # Intermittently- elevated calcium- 10.4; STOP vit D 50k; today calcium is 10.0.  Vit D levels- 69 [SEP 2023]- STABLE.  # Anemia: Hemoglobin around 11-12 overall stable/improving; on PO iron every other day. STABLE.  Will check irons studies;ferritin.   #CKD stage IV- GFR ~24; Continue enough hydration. Monitor closely. [s/p [facility] evaluation on June 1st, 2023]- STABLE.   #Diabetes: Blood sugars 108- patient on metformin on glimepiride [as per nephrology]  # IV ACCESS: PIV.    # DISPOSITION:  # [name] today  # follow up in 3 weeks/Tuesday or Wednesday - MD; labs- cbc/cmp;iron studies; ferritin;  [name]- Dr.B

## 2022-07-06 NOTE — Progress Notes (Signed)
Patient denies new problems/concerns today.    BP 164/71, HR 56-took bp meds at 8 am

## 2022-07-06 NOTE — Patient Instructions (Signed)
[facility]  Discharge Instructions: Thank you for choosing [facility] to provide your oncology and hematology care.  If you have a lab appointment with the Cancer Center, please go directly to the Cancer Center and check in at the registration area.  Wear comfortable clothing and clothing appropriate for easy access to any Portacath or PICC line.   We strive to give you quality time with your provider. You may need to reschedule your appointment if you arrive late (15 or more minutes).  Arriving late affects you and other patients whose appointments are after yours.  Also, if you miss three or more appointments without notifying the office, you may be dismissed from the clinic at the provider's discretion.      For prescription refill requests, have your pharmacy contact our office and allow 72 hours for refills to be completed.    Today you received the following chemotherapy and/or immunotherapy agents [name]      To help prevent nausea and vomiting after your treatment, we encourage you to take your nausea medication as directed.  BELOW ARE SYMPTOMS THAT SHOULD BE REPORTED IMMEDIATELY: *FEVER GREATER THAN 100.4 F (38 C) OR HIGHER *CHILLS OR SWEATING *NAUSEA AND VOMITING THAT IS NOT CONTROLLED WITH YOUR NAUSEA MEDICATION *UNUSUAL SHORTNESS OF BREATH *UNUSUAL BRUISING OR BLEEDING *URINARY PROBLEMS (pain or burning when urinating, or frequent urination) *BOWEL PROBLEMS (unusual diarrhea, constipation, pain near the anus) TENDERNESS IN MOUTH AND THROAT WITH OR WITHOUT PRESENCE OF ULCERS (sore throat, sores in mouth, or a toothache) UNUSUAL RASH, SWELLING OR PAIN  UNUSUAL VAGINAL DISCHARGE OR ITCHING   Items with * indicate a potential emergency and should be followed up as soon as possible or go to the Emergency Department if any problems should occur.  Please show the CHEMOTHERAPY ALERT CARD or IMMUNOTHERAPY ALERT CARD at check-in to  the Emergency Department and triage nurse.  Should you have questions after your visit or need to cancel or reschedule your appointment, please contact [facility]  [phone] and follow the prompts.  Office hours are 8:00 a.m. to 4:30 p.m. Monday - Friday. Please note that voicemails left after 4:00 p.m. may not be returned until the following business day.  We are closed weekends and major holidays. You have access to a nurse at all times for urgent questions. Please call the main number to the clinic [phone] and follow the prompts.  For any non-urgent questions, you may also contact your provider using MyChart. We now offer e-Visits for anyone 18 and older to request care online for non-urgent symptoms. For details visit [email or web address].   Also download the MyChart app! Go to the app store, search "MyChart", open the app, select [facility], and log in with your MyChart username and password.  Masks are optional in the cancer centers. If you would like for your care team to wear a mask while they are taking care of you, please let them know. For doctor visits, patients may have with them one support person who is at least 74 years old. At this time, visitors are not allowed in the infusion area.   

## 2022-07-06 NOTE — Progress Notes (Signed)
Daily Session Note  Patient Details  Name: Gabrielle Savage MRN: [id number] Date of Birth: 1946-01-23 Referring Provider:   [name] Cancer Associated Rehabilitation & Exercise from 05/11/2022 in [facility] Cardiac and Pulmonary Rehab  Referring Provider Charlaine Dalton MD       Encounter Date: 07/06/2022  Check In:  Session Check In - 07/06/22 1232       Check-In   Supervising physician immediately available to respond to emergencies See telemetry face sheet for immediately available ER MD    Location [facility]-Cardiac & Pulmonary Rehab    Staff Present Coralie Keens, MS, ASCM CEP, Exercise Physiologist;Jenavee Laguardia Luan Pulling, MA, RCEP, CCRP, CCET    Virtual Visit No    Medication changes reported     No    Fall or balance concerns reported    No    Warm-up and Cool-down Performed on first and last piece of equipment    Resistance Training Performed Yes    VAD Patient? No    PAD/SET Patient? No      Pain Assessment   Currently in Pain? No/denies                Social History   Tobacco Use  Smoking Status Never   Passive exposure: Never  Smokeless Tobacco Never    Goals Met:  Proper associated with RPD/PD & O2 Sat Independence with exercise equipment Exercise tolerated well Strength training completed today  Goals Unmet:  BP  Pat reported that she was coming from her first infusion today.  She reported feeling a little tired from infusion, but wanted to come since she was here.  She was walking on the track and stopped for BP check.  Her BP was 240/110 and she felt tired and a little winded.  She sat to rest and quickly recovered to 174/70.  Discussed with nurse and decided that pt should go home to rest and return on Thursday.  BP at discharge was 140/66.  Comments: Pt able to follow exercise prescription today without complaint.  Will continue to monitor for progression.    Dr. Emily Filbert is Medical Director for [facility].  Dr. Ottie Glazier is Medical Director for [facility] Pulmonary Rehabilitation.

## 2022-07-13 DIAGNOSIS — C641 Malignant neoplasm of right kidney, except renal pelvis: Secondary | ICD-10-CM

## 2022-07-13 NOTE — Progress Notes (Signed)
Daily Session Note  Patient Details  Name: Gabrielle Savage MRN: [id number] Date of Birth: 22-May-1946 Referring Provider:   [name] Cancer Associated Rehabilitation & Exercise from 05/11/2022 in [facility] Cardiac and Pulmonary Rehab  Referring Provider Charlaine Dalton MD       Encounter Date: 07/13/2022  Check In:  Session Check In - 07/13/22 1356       Check-In   Supervising physician immediately available to respond to emergencies See telemetry face sheet for immediately available ER MD    Location [facility]-Cardiac & Pulmonary Rehab    Staff Present Coralie Keens, MS, ASCM CEP, Exercise Physiologist;Jessica Luan Pulling, MA, RCEP, CCRP, CCET;Noah Tickle, BS, Exercise Physiologist    Virtual Visit No    Medication changes reported     No    Fall or balance concerns reported    No    Warm-up and Cool-down Performed on first and last piece of equipment    Resistance Training Performed Yes    VAD Patient? No    PAD/SET Patient? No                Social History   Tobacco Use  Smoking Status Never   Passive exposure: Never  Smokeless Tobacco Never    Goals Met:  Proper associated with RPD/PD & O2 Sat Independence with exercise equipment Exercise tolerated well No report of concerns or symptoms today Strength training completed today  Goals Unmet:  Not Applicable  Comments: Pt able to follow exercise prescription today without complaint.  Will continue to monitor for progression.    Dr. Emily Filbert is Medical Director for [facility].  Dr. Ottie Glazier is Medical Director for [facility] Pulmonary Rehabilitation.

## 2022-07-15 ENCOUNTER — Encounter: Payer: Medicare Other | Attending: Nurse Practitioner

## 2022-07-15 DIAGNOSIS — C641 Malignant neoplasm of right kidney, except renal pelvis: Secondary | ICD-10-CM | POA: Insufficient documentation

## 2022-07-15 NOTE — Progress Notes (Signed)
Daily Session Note  Patient Details  Name: Gabrielle Savage MRN: [id number] Date of Birth: Aug 28, 1946 Referring Provider:   [name] Cancer Associated Rehabilitation & Exercise from 05/11/2022 in [facility] Card[facility] and Pulmonary Rehab  Referring Provider Charlaine Dalton MD       Encounter Date: 07/15/2022  Check In:  Session Check In - 07/15/22 1238       Check-In   Supervising physician immediately available to respond to emergencies See telemetry face sheet for immediately available ER MD    Location [facility]Card[facility] Re[facility]    Staff Present Coralie Keens, MS, ASCM CEP, Exercise Physiologist;Barack Nicodemus, BS, Exercise Physiologist    Virtual Visit No    Medication changes reported     No    Fall or balance concerns reported    No    Warm-up and Cool-down Performed on first and last piece of equipment    Resistance Training Performed Yes    VAD Patient? No    PAD/SET Patient? No      Pain Assessment   Currently in Pain? No/denies    Multiple Pain Sites No                Social History   Tobacco Use  Smoking Status Never   Passive exposure: Never  Smokeless Tobacco Never    Goals Met:  Independence with exercise equipment Exercise tolerated well No report of concerns or symptoms today  Goals Unmet:  Not Applicable  Comments: Pt able to follow exercise prescription today without complaint.  Will continue to monitor for progression.    Dr. Emily Filbert is Medical Director for [facility].  Dr. Ottie Glazier is Medical Director for [facility] Pulmonary Rehabilitation.

## 2022-07-16 ENCOUNTER — Other Ambulatory Visit: Payer: Self-pay | Admitting: Internal Medicine

## 2022-07-20 ENCOUNTER — Ambulatory Visit: Payer: Medicare Other | Admitting: Internal Medicine

## 2022-07-20 ENCOUNTER — Encounter: Payer: Medicare Other | Admitting: *Deleted

## 2022-07-20 ENCOUNTER — Other Ambulatory Visit: Payer: Medicare Other

## 2022-07-20 ENCOUNTER — Ambulatory Visit: Payer: Medicare Other

## 2022-07-20 DIAGNOSIS — C641 Malignant neoplasm of right kidney, except renal pelvis: Secondary | ICD-10-CM

## 2022-07-20 NOTE — Progress Notes (Signed)
Daily Session Note  Patient Details  Name: Gabrielle Savage MRN: [id number] Date of Birth: 1946-06-18 Referring Provider:   [name] Cancer Associated Rehabilitation & Exercise from 05/11/2022 in [facility] Cardiac and Pulmonary Rehab  Referring Provider Charlaine Dalton MD       Encounter Date: 07/20/2022  Check In:  Session Check In - 07/20/22 1222       Check-In   Supervising physician immediately available to respond to emergencies See telemetry face sheet for immediately available ER MD    Location [facility]-Cardiac & Pulmonary Rehab    Staff Present Coralie Keens, MS, ASCM CEP, Exercise Physiologist;Emmaline [place] Luan Pulling, MA, RCEP, CCRP, CCET;Noah Tickle, BS, Exercise Physiologist    Virtual Visit No    Medication changes reported     No    Fall or balance concerns reported    No    Warm-up and Cool-down Performed on first and last piece of equipment    Resistance Training Performed Yes    VAD Patient? No    PAD/SET Patient? No      Pain Assessment   Currently in Pain? No/denies                Social History   Tobacco Use  Smoking Status Never   Passive exposure: Never  Smokeless Tobacco Never    Goals Met:  Proper associated with RPD/PD & O2 Sat Independence with exercise equipment Exercise tolerated well No report of concerns or symptoms today Strength training completed today  Goals Unmet:  Not Applicable  Comments: Pt able to follow exercise prescription today without complaint.  Will continue to monitor for progression.    Dr. Emily Filbert is Medical Director for [facility].  Dr. Ottie Glazier is Medical Director for [facility] Pulmonary Rehabilitation.

## 2022-07-22 DIAGNOSIS — C641 Malignant neoplasm of right kidney, except renal pelvis: Secondary | ICD-10-CM

## 2022-07-22 NOTE — Progress Notes (Signed)
Daily Session Note  Patient Details  Name: Gabrielle Savage MRN: [id number] Date of Birth: 11-10-1945 Referring Provider:   [name] Cancer Associated Rehabilitation & Exercise from 05/11/2022 in [facility] Cardiac and Pulmonary Rehab  Referring Provider Charlaine Dalton MD       Encounter Date: 07/22/2022  Check In:  Session Check In - 07/22/22 1257       Check-In   Supervising physician immediately available to respond to emergencies See telemetry face sheet for immediately available ER MD    Location [facility]-Cardiac & Pulmonary Rehab    Staff Present Coralie Keens, MS, ASCM CEP, Exercise Physiologist;Kolden Dupee, BS, Exercise Physiologist    Virtual Visit No    Medication changes reported     No    Fall or balance concerns reported    No    Tobacco Cessation No Change    Warm-up and Cool-down Performed on first and last piece of equipment    Resistance Training Performed Yes    VAD Patient? No    PAD/SET Patient? No      Pain Assessment   Currently in Pain? No/denies    Multiple Pain Sites No                Social History   Tobacco Use  Smoking Status Never   Passive exposure: Never  Smokeless Tobacco Never    Goals Met:  Independence with exercise equipment Exercise tolerated well No report of concerns or symptoms today  Goals Unmet:  Not Applicable  Comments: Pt able to follow exercise prescription today without complaint.  Will continue to monitor for progression.    Dr. Emily Filbert is Medical Director for [place].  Dr. Ottie Glazier is Medical Director for [facility] Pulmonary Rehabilitation.

## 2022-07-26 ENCOUNTER — Other Ambulatory Visit: Payer: Self-pay | Admitting: *Deleted

## 2022-07-26 DIAGNOSIS — C641 Malignant neoplasm of right kidney, except renal pelvis: Secondary | ICD-10-CM

## 2022-07-27 ENCOUNTER — Inpatient Hospital Stay (HOSPITAL_BASED_OUTPATIENT_CLINIC_OR_DEPARTMENT_OTHER): Payer: Medicare Other | Admitting: Oncology

## 2022-07-27 ENCOUNTER — Inpatient Hospital Stay: Payer: Medicare Other | Attending: Internal Medicine

## 2022-07-27 ENCOUNTER — Ambulatory Visit: Payer: Medicare Other

## 2022-07-27 ENCOUNTER — Ambulatory Visit: Payer: Medicare Other | Admitting: Internal Medicine

## 2022-07-27 ENCOUNTER — Other Ambulatory Visit: Payer: Medicare Other

## 2022-07-27 ENCOUNTER — Inpatient Hospital Stay: Payer: Medicare Other

## 2022-07-27 DIAGNOSIS — Z5112 Encounter for antineoplastic immunotherapy: Secondary | ICD-10-CM | POA: Diagnosis not present

## 2022-07-27 DIAGNOSIS — C641 Malignant neoplasm of right kidney, except renal pelvis: Secondary | ICD-10-CM | POA: Diagnosis present

## 2022-07-27 DIAGNOSIS — Z905 Acquired absence of kidney: Secondary | ICD-10-CM | POA: Insufficient documentation

## 2022-07-27 DIAGNOSIS — Z79899 Other long term (current) drug therapy: Secondary | ICD-10-CM | POA: Insufficient documentation

## 2022-07-27 LAB — CBC WITH DIFFERENTIAL/PLATELET
Abs Immature Granulocytes: 0.02 10*3/uL (ref 0.00–0.07)
Basophils Absolute: 0 10*3/uL (ref 0.0–0.1)
Basophils Relative: 1 %
Eosinophils Absolute: 0.2 10*3/uL (ref 0.0–0.5)
Eosinophils Relative: 3 %
HCT: 38.3 % (ref 36.0–46.0)
Hemoglobin: 12.1 g/dL (ref 12.0–15.0)
Immature Granulocytes: 0 %
Lymphocytes Relative: 24 %
Lymphs Abs: 1.4 10*3/uL (ref 0.7–4.0)
MCH: 30.4 pg (ref 26.0–34.0)
MCHC: 31.6 g/dL (ref 30.0–36.0)
MCV: 96.2 fL (ref 80.0–100.0)
Monocytes Absolute: 0.6 10*3/uL (ref 0.1–1.0)
Monocytes Relative: 10 %
Neutro Abs: 3.7 10*3/uL (ref 1.7–7.7)
Neutrophils Relative %: 62 %
Platelets: 185 10*3/uL (ref 150–400)
RBC: 3.98 MIL/uL (ref 3.87–5.11)
RDW: 13.3 % (ref 11.5–15.5)
WBC: 6 10*3/uL (ref 4.0–10.5)
nRBC: 0 % (ref 0.0–0.2)

## 2022-07-27 LAB — COMPREHENSIVE METABOLIC PANEL
ALT: 15 U/L (ref 0–44)
AST: 17 U/L (ref 15–41)
Albumin: 4.2 g/dL (ref 3.5–5.0)
Alkaline Phosphatase: 92 U/L (ref 38–126)
Anion gap: 7 (ref 5–15)
BUN: 63 mg/dL — ABNORMAL HIGH (ref 8–23)
CO2: 23 mmol/L (ref 22–32)
Calcium: 10.1 mg/dL (ref 8.9–10.3)
Chloride: 107 mmol/L (ref 98–111)
Creatinine, Ser: 2.44 mg/dL — ABNORMAL HIGH (ref 0.44–1.00)
GFR, Estimated: 20 mL/min — ABNORMAL LOW (ref >60)
Glucose, Bld: 127 mg/dL — ABNORMAL HIGH (ref 70–99)
Potassium: 4.4 mmol/L (ref 3.5–5.1)
Sodium: 137 mmol/L (ref 135–145)
Total Bilirubin: 0.5 mg/dL (ref 0.3–1.2)
Total Protein: 7.2 g/dL (ref 6.5–8.1)

## 2022-07-27 LAB — IRON AND TIBC
Iron: 105 ug/dL (ref 28–170)
Saturation Ratios: 35 % — ABNORMAL HIGH (ref 10.4–31.8)
TIBC: 301 ug/dL (ref 250–450)
UIBC: 196 ug/dL

## 2022-07-27 LAB — TSH: TSH: 3.431 u[IU]/mL (ref 0.350–4.500)

## 2022-07-27 LAB — FERRITIN: Ferritin: 301 ng/mL (ref 11–307)

## 2022-07-27 MED ORDER — SODIUM CHLORIDE 0.9 % IV SOLN
200.0000 mg | Freq: Once | INTRAVENOUS | Status: AC
  Administered 2022-07-27: 200 mg via INTRAVENOUS
  Filled 2022-07-27: qty 8

## 2022-07-27 MED ORDER — SODIUM CHLORIDE 0.9 % IV SOLN
Freq: Once | INTRAVENOUS | Status: AC
  Filled 2022-07-27: qty 250

## 2022-07-27 NOTE — Patient Instructions (Signed)
[facility]  Discharge Instructions: Thank you for choosing [facility] to provide your oncology and hematology care.  If you have a lab appointment with the Cancer Center, please go directly to the Cancer Center and check in at the registration area.  Wear comfortable clothing and clothing appropriate for easy access to any Portacath or PICC line.   We strive to give you quality time with your provider. You may need to reschedule your appointment if you arrive late (15 or more minutes).  Arriving late affects you and other patients whose appointments are after yours.  Also, if you miss three or more appointments without notifying the office, you may be dismissed from the clinic at the provider's discretion.      For prescription refill requests, have your pharmacy contact our office and allow 72 hours for refills to be completed.    Today you received the following chemotherapy and/or immunotherapy agents [name]      To help prevent nausea and vomiting after your treatment, we encourage you to take your nausea medication as directed.  BELOW ARE SYMPTOMS THAT SHOULD BE REPORTED IMMEDIATELY: *FEVER GREATER THAN 100.4 F (38 C) OR HIGHER *CHILLS OR SWEATING *NAUSEA AND VOMITING THAT IS NOT CONTROLLED WITH YOUR NAUSEA MEDICATION *UNUSUAL SHORTNESS OF BREATH *UNUSUAL BRUISING OR BLEEDING *URINARY PROBLEMS (pain or burning when urinating, or frequent urination) *BOWEL PROBLEMS (unusual diarrhea, constipation, pain near the anus) TENDERNESS IN MOUTH AND THROAT WITH OR WITHOUT PRESENCE OF ULCERS (sore throat, sores in mouth, or a toothache) UNUSUAL RASH, SWELLING OR PAIN  UNUSUAL VAGINAL DISCHARGE OR ITCHING   Items with * indicate a potential emergency and should be followed up as soon as possible or go to the Emergency Department if any problems should occur.  Please show the CHEMOTHERAPY ALERT CARD or IMMUNOTHERAPY ALERT CARD at check-in to  the Emergency Department and triage nurse.  Should you have questions after your visit or need to cancel or reschedule your appointment, please contact [facility]  [phone] and follow the prompts.  Office hours are 8:00 a.m. to 4:30 p.m. Monday - Friday. Please note that voicemails left after 4:00 p.m. may not be returned until the following business day.  We are closed weekends and major holidays. You have access to a nurse at all times for urgent questions. Please call the main number to the clinic [phone] and follow the prompts.  For any non-urgent questions, you may also contact your provider using MyChart. We now offer e-Visits for anyone 18 and older to request care online for non-urgent symptoms. For details visit [email or web address].   Also download the MyChart app! Go to the app store, search "MyChart", open the app, select [facility], and log in with your MyChart username and password.  Masks are optional in the cancer centers. If you would like for your care team to wear a mask while they are taking care of you, please let them know. For doctor visits, patients may have with them one support person who is at least 74 years old. At this time, visitors are not allowed in the infusion area.   

## 2022-07-27 NOTE — Progress Notes (Signed)
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  INTERVAL HISTORY: Patient returns to clinic today for further evaluation and consideration of cycle 11 of [name].  She has chronic fatigue, but otherwise feels well.  She is tolerating her treatments well without significant side effects.  She has no neurologic complaints.  She denies any recent fevers or illnesses.  She has a good appetite and denies weight loss.  She has no chest pain, shortness of breath, cough, or hemoptysis.  She denies any nausea, vomiting, constipation, or diarrhea.  She has no urinary complaints.  Patient offers no further specific complaints today.  REVIEW OF SYSTEMS:   Review of Systems  Constitutional:  Positive for malaise/fatigue. Negative for fever and weight loss.  Respiratory: Negative.  Negative for cough, hemoptysis and shortness of breath.   Cardiovascular: Negative.  Negative for chest pain and leg swelling.  Gastrointestinal: Negative.  Negative for abdominal pain.  Genitourinary: Negative.  Negative for dysuria and hematuria.  Musculoskeletal: Negative.  Negative for myalgias.  Skin: Negative.  Negative for rash.  Neurological: Negative.  Negative for dizziness, focal weakness, weakness and headaches.  Psychiatric/Behavioral: Negative.  The patient is not nervous/anxious.     As per HPI. Otherwise, a complete review of systems is negative.  PAST MEDICAL HISTORY: Past Medical History:  Diagnosis Date   Aortic atherosclerosis (HCC)    Arthritis    B12 deficiency    Bradycardia    CAD (coronary artery disease)    Carotid atherosclerosis    CKD (chronic kidney disease), stage III (HCC)     Diastolic dysfunction 37/85/8850   a.)  TTE 09/03/2021: EF 63%; no RWMAs; LA mildly enlarged; trivial TR/PR, mild AR, moderate MR; G1DD.   DOE (dyspnea on exertion)    Fatigue    Gout    HLD (hyperlipidemia)    Hypertension    IDA (iron deficiency anemia)    Iliotibial band syndrome, left leg    MDD (major depressive disorder)    OAB (overactive bladder)    Obesity    PAC (premature atrial contraction)    noted on Holter   PONV (postoperative nausea and vomiting)    a.) single episode in 03/2013   PVC (premature ventricular contraction)    noted on Holter   Right kidney mass 10/27/2021   a.) CT 10/27/2021 -- mass measuring 8.3 x 6.8 x 8.1 cm   Sensorineural hearing loss (SNHL) of both ears    SUI (stress urinary incontinence, female)    SVT (supraventricular tachycardia) ([place])    noted on Holter   T2DM (type 2 diabetes mellitus) ([place])    Uterovaginal prolapse    Vitamin D deficiency    Voiding dysfunction     PAST SURGICAL HISTORY: Past Surgical History:  Procedure Laterality Date   ABDOMINAL HYSTERECTOMY     partial   BLADDER SUSPENSION     CHOLECYSTECTOMY     COLONOSCOPY N/A 07/25/2017   Procedure: COLONOSCOPY;  Surgeon: [name], MD;  [facility] ENDOSCOPY;  Service: Endoscopy;  Laterality: N/A;   COLONOSCOPY WITH PROPOFOL N/A 10/19/2021   Procedure: COLONOSCOPY WITH PROPOFOL;  Surgeon: [name], MD;  Location: [facility] ENDOSCOPY;  Service: Endoscopy;  Laterality: N/A;  DM  ESOPHAGOGASTRODUODENOSCOPY (EGD) WITH PROPOFOL N/A 10/19/2021   Procedure: ESOPHAGOGASTRODUODENOSCOPY (EGD) WITH PROPOFOL;  Surgeon: [name], MD;  Location: [facility] ENDOSCOPY;  Service: Endoscopy;  Laterality: N/A;   JOINT REPLACEMENT Bilateral    PARTIAL KNEE REPLACEMENT   KNEE SURGERY     LAPAROSCOPIC NEPHRECTOMY, HAND ASSISTED Right 11/20/2021   Procedure: HAND ASSISTED LAPAROSCOPIC RADICAL NEPHRECTOMY;  Surgeon: Billey Co, MD;  Location: [facility] ORS;  Service:  Urology;  Laterality: Right;   PARATHYROIDECTOMY      FAMILY HISTORY: Family History  Problem Relation Age of Onset   Heart failure Mother    Stroke Father    Lung cancer Father    Breast cancer Neg Hx     ADVANCED DIRECTIVES (Y/N):  N  HEALTH MAINTENANCE: Social History   Tobacco Use   Smoking status: Never    Passive exposure: Never   Smokeless tobacco: Never  Vaping Use   Vaping Use: Never used  Substance Use Topics   Alcohol use: Not Currently    Comment: occ   Drug use: No     Colonoscopy:  PAP:  Bone density:  Lipid panel:  Allergies  Allergen Reactions   Codeine Nausea Only   Nsaids Other (See Comments)    Bad kidneys   Oxycodone Nausea Only    Current Outpatient Medications  Medication Sig Dispense Refill   allopurinol (ZYLOPRIM) 100 MG tablet Take 300 mg by mouth every morning. Pt has decreased to '100mg'$      amLODipine (NORVASC) 5 MG tablet Take 5 mg by mouth every morning.     citalopram (CELEXA) 10 MG tablet Take 10 mg by mouth every morning.     ferrous sulfate 325 (65 FE) MG tablet Take 325 mg by mouth every other day.     irbesartan (AVAPRO) 150 MG tablet Take 150 mg by mouth every morning.     vitamin B-12 (CYANOCOBALAMIN) 1000 MCG tablet Take 1,000 mcg by mouth daily.     docusate sodium (COLACE) 100 MG capsule Take 1 capsule (100 mg total) by mouth 2 (two) times daily. (Patient not taking: Reported on 07/06/2022) 20 capsule 0   No current facility-administered medications for this visit.    OBJECTIVE: Vitals:   07/27/22 1025  BP: (!) 134/59  Pulse: 66  Resp: 18  Temp: (!) 97.2 F (36.2 C)  SpO2: 99%     Body mass index is 32.92 kg/m.    ECOG FS:0 - Asymptomatic  General: Well-developed, well-nourished, no acute distress. Eyes: Pink conjunctiva, anicteric sclera. HEENT: Normocephalic, moist mucous membranes. Lungs: No audible wheezing or coughing. Heart: Regular rate and rhythm. Abdomen: Soft, nontender, no obvious  distention. Musculoskeletal: No edema, cyanosis, or clubbing. Neuro: Alert, answering all questions appropriately. Cranial nerves grossly intact. Skin: No rashes or petechiae noted. Psych: Normal affect.  LAB RESULTS:  Lab Results  Component Value Date   NA 137 07/27/2022   K 4.4 07/27/2022   CL 107 07/27/2022   CO2 23 07/27/2022   GLUCOSE 127 (H) 07/27/2022   BUN 63 (H) 07/27/2022   CREATININE 2.44 (H) 07/27/2022   CALCIUM 10.1 07/27/2022   PROT 7.2 07/27/2022   ALBUMIN 4.2 07/27/2022   AST 17 07/27/2022   ALT 15 07/27/2022   ALKPHOS 92 07/27/2022   BILITOT 0.5 07/27/2022   GFRNONAA 20 (L) 07/27/2022   GFRAA 46 (L) 02/16/2016    Lab Results  Component Value Date   WBC 6.0 07/27/2022   NEUTROABS 3.7 07/27/2022   HGB 12.1 07/27/2022  HCT 38.3 07/27/2022   MCV 96.2 07/27/2022   PLT 185 07/27/2022     STUDIES: No results found.  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  Proceed with cycle 11 of treatment today.  Return to clinic in 3 weeks for further evaluation and consideration of cycle 12. Chronic renal insufficiency: Patient's creatinine is trended up to 2.44.  Monitor.  Proceed with treatment as above. Anemia: Resolved.  Patient's iron stores are also within normal limits. Hypercalcemia: Patient's calcium levels have been normal since August 2023.  Patient expressed understanding and was in agreement with this plan. She also understands that She can call clinic at any time with any questions, concerns, or complaints.    Cancer Staging  Clear cell renal cell carcinoma, right (HCC) Staging form: Kidney, AJCC 8th Edition - Pathologic: Stage III (pT3a, pN0, cM0) - Signed by Cammie Sickle, MD on 01/08/2022   Lloyd Huger, MD   07/27/2022 12:44 PM

## 2022-07-27 NOTE — Progress Notes (Signed)
Doing well. Only SE is fatigue. No concerns for today.

## 2022-07-29 ENCOUNTER — Encounter: Payer: Medicare Other | Admitting: *Deleted

## 2022-07-29 DIAGNOSIS — C641 Malignant neoplasm of right kidney, except renal pelvis: Secondary | ICD-10-CM

## 2022-07-29 NOTE — Progress Notes (Signed)
Daily Session Note  Patient Details  Name: Gabrielle Savage MRN: [id number] Date of Birth: Jun 07, 1946 Referring Provider:   [name] Cancer Associated Rehabilitation & Exercise from 05/11/2022 in [facility] Cardiac and Pulmonary Rehab  Referring Provider Charlaine Dalton MD       Encounter Date: 07/29/2022  Check In:  Session Check In - 07/29/22 1228       Check-In   Supervising physician immediately available to respond to emergencies See telemetry face sheet for immediately available ER MD    Location [facility]-Cardiac & Pulmonary Rehab    Staff Present Coralie Keens, MS, ASCM CEP, Exercise Physiologist;Laureen Owens Shark, BS, RRT, CPFT;Elana Jian [place], MA, RCEP, CCRP, CCET    Virtual Visit No    Medication changes reported     No    Fall or balance concerns reported    No    Warm-up and Cool-down Performed on first and last piece of equipment    Resistance Training Performed Yes    VAD Patient? No    PAD/SET Patient? No      Pain Assessment   Currently in Pain? No/denies                Social History   Tobacco Use  Smoking Status Never   Passive exposure: Never  Smokeless Tobacco Never    Goals Met:  Proper associated with RPD/PD & O2 Sat Independence with exercise equipment Exercise tolerated well No report of concerns or symptoms today Strength training completed today  Goals Unmet:  Not Applicable  Comments: Pt able to follow exercise prescription today without complaint.  Will continue to monitor for progression.    Dr. Emily Filbert is Medical Director for [place].  Dr. Ottie Glazier is Medical Director for [facility] Pulmonary Rehabilitation.

## 2022-08-03 LAB — T4: T4, Total: 7.9 ug/dL (ref 4.5–12.0)

## 2022-08-04 ENCOUNTER — Telehealth: Payer: Self-pay | Admitting: *Deleted

## 2022-08-04 NOTE — Telephone Encounter (Signed)
I spoke with the patient. She had her testing performed on 06/07/22. If [facility] can not locate her orignial prescription for the covid tesitng, then she can come pick up another prescription that is dated for that dos. Patient agreeable to come to clinic to pick up the order for [facility] 06/07/22. I will put the script downstairs at front desk.

## 2022-08-04 NOTE — Telephone Encounter (Signed)
Patient called requesting we send a "referral" to [facility] for the COVID test she had done or they are going to charge her $200 for that test she had dine

## 2022-08-10 ENCOUNTER — Ambulatory Visit: Payer: Medicare Other | Admitting: Internal Medicine

## 2022-08-10 ENCOUNTER — Ambulatory Visit: Payer: Medicare Other

## 2022-08-10 ENCOUNTER — Other Ambulatory Visit: Payer: Medicare Other

## 2022-08-10 DIAGNOSIS — C641 Malignant neoplasm of right kidney, except renal pelvis: Secondary | ICD-10-CM

## 2022-08-10 NOTE — Progress Notes (Signed)
Daily Session Note  Patient Details  Name: Gabrielle Savage MRN: [id number] Date of Birth: [birth year] Referring Provider:   [name] Cancer Associated Rehabilitation & Exercise from 05/11/2022 in [facility] Cardiac and Pulmonary Rehab  Referring Provider Charlaine Dalton MD       Encounter Date: 08/10/2022  Check In:  Session Check In - 08/10/22 1226       Check-In   Supervising physician immediately available to respond to emergencies See telemetry face sheet for immediately available ER MD    Location [facility]-Cardiac & Pulmonary Rehab    Staff Present Antionette Fairy, BS, Exercise Physiologist;Jessica Kremmling, MA, RCEP, CCRP, CCET    Virtual Visit No    Medication changes reported     No    Fall or balance concerns reported    No    Warm-up and Cool-down Performed on first and last piece of equipment    Resistance Training Performed Yes    VAD Patient? No    PAD/SET Patient? No      Pain Assessment   Currently in Pain? No/denies    Multiple Pain Sites No                Social History   Tobacco Use  Smoking Status Never   Passive exposure: Never  Smokeless Tobacco Never    Goals Met:  Independence with exercise equipment Exercise tolerated well No report of concerns or symptoms today  Goals Unmet:  Not Applicable  Comments: Pt able to follow exercise prescription today without complaint.  Will continue to monitor for progression.    Dr. Emily Filbert is Medical Director for [place].  Dr. Ottie Glazier is Medical Director for [facility] Pulmonary Rehabilitation.

## 2022-08-12 DIAGNOSIS — C641 Malignant neoplasm of right kidney, except renal pelvis: Secondary | ICD-10-CM

## 2022-08-12 NOTE — Progress Notes (Signed)
Daily Session Note  Patient Details  Name: Gabrielle Savage MRN: [id number] Date of Birth: 1946/04/13 Referring Provider:   [name] Cancer Associated Rehabilitation & Exercise from 05/11/2022 in [facility] Cardiac and Pulmonary Rehab  Referring Provider Charlaine Dalton MD       Encounter Date: 08/12/2022  Check In:  Session Check In - 08/12/22 1250       Check-In   Supervising physician immediately available to respond to emergencies See telemetry face sheet for immediately available ER MD    Location [facility]-Cardiac & Pulmonary Rehab    Staff Present Coralie Keens, MS, ASCM CEP, Exercise Physiologist;Jessica Luan Pulling, MA, RCEP, CCRP, CCET    Virtual Visit No    Medication changes reported     No    Fall or balance concerns reported    No    Warm-up and Cool-down Performed on first and last piece of equipment    Resistance Training Performed Yes   yoga   VAD Patient? No    PAD/SET Patient? No                Social History   Tobacco Use  Smoking Status Never   Passive exposure: Never  Smokeless Tobacco Never    Goals Met:  Proper associated with RPD/PD & O2 Sat Exercise tolerated well No report of concerns or symptoms today Strength training completed today  Goals Unmet:  Not Applicable  Comments: Pt able to follow exercise prescription today without complaint.  Will continue to monitor for progression.   Yoga completed today.   Dr. Emily Filbert is Medical Director for [place].  Dr. Ottie Glazier is Medical Director for [facility] Pulmonary Rehabilitation.

## 2022-08-17 ENCOUNTER — Other Ambulatory Visit: Payer: Medicare Other

## 2022-08-17 ENCOUNTER — Inpatient Hospital Stay: Payer: Medicare Other

## 2022-08-17 ENCOUNTER — Inpatient Hospital Stay (HOSPITAL_BASED_OUTPATIENT_CLINIC_OR_DEPARTMENT_OTHER): Payer: Medicare Other | Admitting: Internal Medicine

## 2022-08-17 ENCOUNTER — Ambulatory Visit: Payer: Medicare Other

## 2022-08-17 ENCOUNTER — Ambulatory Visit: Payer: Medicare Other | Admitting: Internal Medicine

## 2022-08-17 ENCOUNTER — Inpatient Hospital Stay: Payer: Medicare Other | Attending: Internal Medicine

## 2022-08-17 ENCOUNTER — Encounter: Payer: Self-pay | Admitting: Internal Medicine

## 2022-08-17 ENCOUNTER — Other Ambulatory Visit: Payer: Self-pay

## 2022-08-17 VITALS — BP 145/63 | HR 62 | Temp 96.3°F | Resp 20 | Wt 184.0 lb

## 2022-08-17 DIAGNOSIS — C641 Malignant neoplasm of right kidney, except renal pelvis: Secondary | ICD-10-CM

## 2022-08-17 DIAGNOSIS — Z79899 Other long term (current) drug therapy: Secondary | ICD-10-CM | POA: Insufficient documentation

## 2022-08-17 DIAGNOSIS — R946 Abnormal results of thyroid function studies: Secondary | ICD-10-CM | POA: Diagnosis not present

## 2022-08-17 DIAGNOSIS — Z5112 Encounter for antineoplastic immunotherapy: Secondary | ICD-10-CM | POA: Insufficient documentation

## 2022-08-17 LAB — COMPREHENSIVE METABOLIC PANEL
ALT: 13 U/L (ref 0–44)
AST: 14 U/L — ABNORMAL LOW (ref 15–41)
Albumin: 3.9 g/dL (ref 3.5–5.0)
Alkaline Phosphatase: 90 U/L (ref 38–126)
Anion gap: 8 (ref 5–15)
BUN: 60 mg/dL — ABNORMAL HIGH (ref 8–23)
CO2: 20 mmol/L — ABNORMAL LOW (ref 22–32)
Calcium: 9.7 mg/dL (ref 8.9–10.3)
Chloride: 112 mmol/L — ABNORMAL HIGH (ref 98–111)
Creatinine, Ser: 2.16 mg/dL — ABNORMAL HIGH (ref 0.44–1.00)
GFR, Estimated: 23 mL/min — ABNORMAL LOW (ref >60)
Glucose, Bld: 117 mg/dL — ABNORMAL HIGH (ref 70–99)
Potassium: 4.2 mmol/L (ref 3.5–5.1)
Sodium: 140 mmol/L (ref 135–145)
Total Bilirubin: 0.3 mg/dL (ref 0.3–1.2)
Total Protein: 6.8 g/dL (ref 6.5–8.1)

## 2022-08-17 LAB — CBC WITH DIFFERENTIAL/PLATELET
Abs Immature Granulocytes: 0.01 10*3/uL (ref 0.00–0.07)
Basophils Absolute: 0 10*3/uL (ref 0.0–0.1)
Basophils Relative: 1 %
Eosinophils Absolute: 0.2 10*3/uL (ref 0.0–0.5)
Eosinophils Relative: 4 %
HCT: 36.5 % (ref 36.0–46.0)
Hemoglobin: 11.7 g/dL — ABNORMAL LOW (ref 12.0–15.0)
Immature Granulocytes: 0 %
Lymphocytes Relative: 23 %
Lymphs Abs: 1.3 10*3/uL (ref 0.7–4.0)
MCH: 30.2 pg (ref 26.0–34.0)
MCHC: 32.1 g/dL (ref 30.0–36.0)
MCV: 94.1 fL (ref 80.0–100.0)
Monocytes Absolute: 0.6 10*3/uL (ref 0.1–1.0)
Monocytes Relative: 10 %
Neutro Abs: 3.5 10*3/uL (ref 1.7–7.7)
Neutrophils Relative %: 62 %
Platelets: 156 10*3/uL (ref 150–400)
RBC: 3.88 MIL/uL (ref 3.87–5.11)
RDW: 13.8 % (ref 11.5–15.5)
WBC: 5.6 10*3/uL (ref 4.0–10.5)
nRBC: 0 % (ref 0.0–0.2)

## 2022-08-17 LAB — TSH: TSH: 2.536 u[IU]/mL (ref 0.350–4.500)

## 2022-08-17 LAB — PHOSPHORUS: Phosphorus: 4.8 mg/dL — ABNORMAL HIGH (ref 2.5–4.6)

## 2022-08-17 MED ORDER — TRIAMCINOLONE ACETONIDE 0.5 % EX OINT: 1.0000 | TOPICAL_OINTMENT | Freq: Two times a day (BID) | CUTANEOUS | 1 refills | Status: DC

## 2022-08-17 MED ORDER — SODIUM CHLORIDE 0.9 % IV SOLN
200.0000 mg | Freq: Once | INTRAVENOUS | Status: AC
  Administered 2022-08-17: 200 mg via INTRAVENOUS
  Filled 2022-08-17: qty 8

## 2022-08-17 MED ORDER — SODIUM CHLORIDE 0.9 % IV SOLN
Freq: Once | INTRAVENOUS | Status: AC
  Filled 2022-08-17: qty 250

## 2022-08-17 NOTE — Assessment & Plan Note (Addendum)
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  Tolerating [name] fairly well-except for mild skin rash see below.  # Proceed with [name] #11 today. Labs today reviewed;  acceptable for treatment today. DEC 2023- TSH-WNL.    # Skin rash- ? [name] vs CKD- check Phos- recommed kenalog ointment.  Also recommend antihistamine to Claritin.  # Intermittently- elevated calcium- 10.4; STOP vit D 50k; today calcium is 10.0.  Vit D levels- 69 [SEP 2023]- STABLE.  # Anemia: Hemoglobin around 11-12 overall stable/improving; on PO iron every other day. NOV 2023- Iron sat-35%; ferretin- 330- STABLE.    #CKD stage IV- GFR ~24; Continue enough hydration. Monitor closely. [s/p [facility] evaluation on June 1st, 2023]- STABLE.   #Diabetes: Blood sugars 108- patient on metformin on glimepiride [as per nephrology]STABLE.   # IV ACCESS: PIV.    # DISPOSITION: # check Phos to labs today- ordered # [name] today # cancel 19th lab appt # follow up in 3 weeks/Tuesday or Wednesday - MD; labs- cbc/cmp; TSH- [name]- Dr.B

## 2022-08-17 NOTE — Progress Notes (Signed)
Patient states she is really itchy on her back and "private area".  Patient states this has been going on for the past 3 weeks.

## 2022-08-17 NOTE — Progress Notes (Signed)
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  Histologic Grade (WHO / ISUP Grade): G3, nucleoli conspicuous and  eosinophilic at 381 X magnification  Tumor Extent: Extends into major vein (segmental branch adjacent to  renal sinus)  Sarcomatoid Features: Not identified  Rhabdoid Features: Not identified  Tumor Necrosis: Present, microscopic and macroscopic, 20%   MARGINS  Margin Status: All margins negative for invasive carcinoma   REGIONAL LYMPH NODES  Regional Lymph Node Status: Not applicable (no regional lymph nodes  submitted or found)   DISTANT METASTASIS  Distant Site(s) Involved, if applicable: Not applicable   PATHOLOGIC STAGE CLASSIFICATION (pTNM, AJCC 8th Edition):  TNM Descriptors: Applicable  WE9H  pN - Not assigned (no lymph nodes submitted or found)  pM - Not applicable   # BZ1I- stage III; G-3; no sarcomatoid features [right nephrectomy- Dr.Sninksi].  # FEB 14th, 2023- CT AP [GI; [facility]]- . Large mass exophytic extending from lower to mid pole of the RIGHT kidney consistent with renal neoplasm. [Dr.Sninski; [facility]log[facility]]  # FEB 2023- Right lower lobe subpleural 4 mm lung nodule; bone survey negative  #April 7th, 2023- On adjuvant [name]    # CKD III [ sec to HTN > 20 years; none now]; Anemia- EGD/colo- 2023- [[facility]]; Dr.Kowalski [No CAD]   Clear cell renal cell carcinoma, right ([place])  12/12/2021 Initial Diagnosis   Clear cell renal cell carcinoma, right ([place])   12/18/2021 - 04/05/2022 Chemotherapy   Patient is on Treatment Plan : RENAL CELL Pembrolizumab (200) q21d     12/18/2021 -  Chemotherapy   Patient is on  Treatment Plan : kidney adjuvant Pembrolizumab (200) q21d     01/08/2022 Cancer Staging   Staging form: Kidney, AJCC 8th Edition - Pathologic: Stage III (pT3a, pN0, cM0) - Signed by Cammie Sickle, MD on 01/08/2022    HISTORY OF PRESENTING ILLNESS: Ambulating independently.    Gabrielle Savage 76 y.o.  female with history of stage III kidney cancer; CKD stage III currently on adjuvant Beryle Flock is here for follow-up.    Patient states she is really itchy on her back and groin.  Patient states this has been going on for the past 3 weeks. Using benadryl ointment.  No significant improvement.  Denies any worsening shortness of breath or cough.  Denies any worsening joint pains.  No nausea no vomiting.  Review of Systems  Constitutional:  Positive for malaise/fatigue. Negative for chills and fever.  HENT:  Negative for nosebleeds and sore throat.   Eyes:  Negative for double vision.  Respiratory:  Negative for cough, hemoptysis, sputum production and wheezing.   Cardiovascular:  Negative for chest pain, palpitations, orthopnea and leg swelling.  Gastrointestinal:  Negative for abdominal pain, blood in stool, constipation, diarrhea, heartburn, melena, nausea and vomiting.  Genitourinary:  Negative for dysuria, frequency and urgency.  Musculoskeletal:  Positive for back pain and joint pain.  Skin:  Positive for itching and rash.  Neurological:  Negative for dizziness, tingling, focal weakness, weakness and headaches.  Endo/Heme/Allergies:  Does not bruise/bleed easily.  Psychiatric/Behavioral:  Negative for depression. The patient is not nervous/anxious and does not have insomnia.      MEDICAL HISTORY:  Past  Medical History:  Diagnosis Date   Aortic atherosclerosis (HCC)    Arthritis    B12 deficiency    Bradycardia    CAD (coronary artery disease)    Carotid atherosclerosis    CKD (chronic kidney disease), stage III (HCC)    Diastolic dysfunction 66/59/9357   a.)  TTE  09/03/2021: EF 63%; no RWMAs; LA mildly enlarged; trivial TR/PR, mild AR, moderate MR; G1DD.   DOE (dyspnea on exertion)    Fatigue    Gout    HLD (hyperlipidemia)    Hypertension    IDA (iron deficiency anemia)    Iliotibial band syndrome, left leg    MDD (major depressive disorder)    OAB (overactive bladder)    Obesity    PAC (premature atrial contraction)    noted on Holter   PONV (postoperative nausea and vomiting)    a.) single episode in 03/2013   PVC (premature ventricular contraction)    noted on Holter   Right kidney mass 10/27/2021   a.) CT 10/27/2021 -- mass measuring 8.3 x 6.8 x 8.1 cm   Sensorineural hearing loss (SNHL) of both ears    SUI (stress urinary incontinence, female)    SVT (supraventricular tachycardia)    noted on Holter   T2DM (type 2 diabetes mellitus) ([place])    Uterovaginal prolapse    Vitamin D deficiency    Voiding dysfunction     SURGICAL HISTORY: Past Surgical History:  Procedure Laterality Date   ABDOMINAL HYSTERECTOMY     partial   BLADDER SUSPENSION     CHOLECYSTECTOMY     COLONOSCOPY N/A 07/25/2017   Procedure: COLONOSCOPY;  Surgeon: [name], MD;  Location[facility] ENDOSCOPY;  Service: Endoscopy;  Laterality: N/A;   COLONOSCOPY WITH PROPOFOL N/A 10/19/2021   Procedure: COLONOSCOPY WITH PROPOFOL;  Surgeon: [name], MD;  Location: [facility] ENDOSCOPY;  Service: Endoscopy;  Laterality: N/A;  DM   ESOPHAGOGASTRODUODENOSCOPY (EGD) WITH PROPOFOL N/A 10/19/2021   Procedure: ESOPHAGOGASTRODUODENOSCOPY (EGD) WITH PROPOFOL;  Surgeon: [name], MD;  Location: [facility] ENDOSCOPY;  Service: Endoscopy;  Laterality: N/A;   JOINT REPLACEMENT Bilateral    PARTIAL KNEE REPLACEMENT   KNEE SURGERY     LAPAROSCOPIC NEPHRECTOMY, HAND ASSISTED Right 11/20/2021   Procedure: HAND ASSISTED LAPAROSCOPIC RADICAL NEPHRECTOMY;  Surgeon: Billey Co, MD;  Location: [facility] ORS;  Service: Urology;  Laterality: Right;   PARATHYROIDECTOMY       SOCIAL HISTORY: Social History   Socioeconomic History   Marital status: Married    Spouse name: Not on file   Number of children: Not on file   Years of education: Not on file   Highest education level: Not on file  Occupational History   Not on file  Tobacco Use   Smoking status: Never    Passive exposure: Never   Smokeless tobacco: Never  Vaping Use   Vaping Use: Never used  Substance and Sexual Activity   Alcohol use: Not Currently    Comment: occ   Drug use: No   Sexual activity: Not Currently  Other Topics Concern   Not on file  Social History Narrative   Lives in [place] - close hospital; with husband; 3 biological children [[place]; [place]; [place]]. Never smoked; glass of wine rare. 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    Social Determinants of Health   Financial Resource Strain: Low Risk  (12/07/2021)   Overall Financial Resource Strain (CARDIA)    Difficulty of Paying Living Expenses: Not hard at  all  Food Insecurity: No Food Insecurity (12/07/2021)   Hunger Vital Sign    Worried About Running Out of Food in the Last Year: Never true    Ran Out of Food in the Last Year: Never true  Transportation Needs: No Transportation Needs (12/07/2021)   PRAPARE - Hydrologist (Medical): No    Lack of Transportation (Non-Medical): No  Physical Activity: Insufficiently Active (12/07/2021)   Exercise Vital Sign    Days of Exercise per Week: 2 days    Minutes of Exercise per Session: 30 min  Stress: No Stress Concern Present (12/07/2021)   [place]    Feeling of Stress : Only a little  Social Connections: Moderately Integrated (12/07/2021)   Social Connection and Isolation Panel [NHANES]    Frequency of Communication with Friends and Family: Three times a week    Frequency of Social Gatherings with Friends and Family: Three times a week    Attends Religious Services: 1 to  4 times per year    Active Member of Clubs or Organizations: No    Attends Archivist Meetings: Never    Marital Status: Married  Human resources officer Violence: Not At Risk (12/07/2021)   Humiliation, Afraid, Rape, and Kick questionnaire    Fear of Current or Ex-Partner: No    Emotionally Abused: No    Physically Abused: No    Sexually Abused: No    FAMILY HISTORY: Family History  Problem Relation Age of Onset   Heart failure Mother    Stroke Father    Lung cancer Father    Breast cancer Neg Hx     ALLERGIES:  is allergic to codeine, nsaids, and oxycodone.  MEDICATIONS:  Current Outpatient Medications  Medication Sig Dispense Refill   allopurinol (ZYLOPRIM) 100 MG tablet Take 300 mg by mouth every morning. Pt has decreased to '100mg'$      amLODipine (NORVASC) 5 MG tablet Take 5 mg by mouth every morning.     citalopram (CELEXA) 10 MG tablet Take 10 mg by mouth every morning.     ferrous sulfate 325 (65 FE) MG tablet Take 325 mg by mouth every other day.     irbesartan (AVAPRO) 150 MG tablet Take 150 mg by mouth every morning.     triamcinolone ointment (KENALOG) 0.5 % Apply 1 Application topically 2 (two) times daily. 30 g 1   vitamin B-12 (CYANOCOBALAMIN) 1000 MCG tablet Take 1,000 mcg by mouth daily.     docusate sodium (COLACE) 100 MG capsule Take 1 capsule (100 mg total) by mouth 2 (two) times daily. (Patient not taking: Reported on 07/06/2022) 20 capsule 0   No current facility-administered medications for this visit.      Marland Kitchen  PHYSICAL EXAMINATION:  Vitals:   08/17/22 0953  BP: (!) 145/63  Pulse: 62  Resp: 20  Temp: (!) 96.3 F (35.7 C)  SpO2: 100%   Filed Weights   08/17/22 0953  Weight: 184 lb (83.5 kg)   Mild maculopapular rash noted in the back and also front.   Physical Exam Vitals and nursing note reviewed.  HENT:     Head: Normocephalic and atraumatic.     Mouth/Throat:     Pharynx: Oropharynx is clear.  Eyes:     Extraocular Movements:  Extraocular movements intact.     Pupils: Pupils are equal, round, and reactive to light.  Cardiovascular:     Rate and Rhythm: Normal  rate and regular rhythm.  Pulmonary:     Comments: Decreased breath sounds bilaterally.  Abdominal:     Palpations: Abdomen is soft.  Musculoskeletal:        General: Normal range of motion.     Cervical back: Normal range of motion.  Skin:    General: Skin is warm.  Neurological:     General: No focal deficit present.     Mental Status: She is alert and oriented to person, place, and time.  Psychiatric:        Behavior: Behavior normal.        Judgment: Judgment normal.      LABORATORY DATA:  I have reviewed the data as listed Lab Results  Component Value Date   WBC 5.6 08/17/2022   HGB 11.7 (L) 08/17/2022   HCT 36.5 08/17/2022   MCV 94.1 08/17/2022   PLT 156 08/17/2022   Recent Labs    07/06/22 0942 07/27/22 0956 08/17/22 0941  NA 140 137 140  K 4.3 4.4 4.2  CL 111 107 112*  CO2 25 23 20*  GLUCOSE 101* 127* 117*  BUN 53* 63* 60*  CREATININE 2.14* 2.44* 2.16*  CALCIUM 9.8 10.1 9.7  GFRNONAA 24* 20* 23*  PROT 7.0 7.2 6.8  ALBUMIN 3.9 4.2 3.9  AST 14* 17 14*  ALT '14 15 13  '$ ALKPHOS 82 92 90  BILITOT 0.5 0.5 0.3    RADIOGRAPHIC STUDIES: I have personally reviewed the radiological images as listed and agreed with the findings in the report. No results found.  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  Tolerating [name] fairly well-except for mild skin rash see below.  # Proceed with [name] #11 today. Labs today reviewed;  acceptable for treatment today. DEC 2023- TSH-WNL.    # Skin rash- ? [name] vs CKD- check Phos- recommed kenalog ointment.   # Intermittently- elevated calcium- 10.4; STOP vit D 50k; today calcium is 10.0.  Vit D levels- 69 [SEP 2023]- STABLE.  # Anemia: Hemoglobin around 11-12 overall stable/improving; on PO iron every other day. NOV  2023- Iron sat-35%; ferretin- 330- STABLE.    #CKD stage IV- GFR ~24; Continue enough hydration. Monitor closely. [s/p [facility] evaluation on June 1st, 2023]- STABLE.   #Diabetes: Blood sugars 108- patient on metformin on glimepiride [as per nephrology]STABLE.   # IV ACCESS: PIV.    # DISPOSITION: # check Phos to labs today- ordered # [name] today # cancel 19th lab appt # follow up in 3 weeks/Tuesday or Wednesday - MD; labs- cbc/cmp; TSH- Beryle Flock- Dr.B     All questions were answered. The patient knows to call the clinic with any problems, questions or concerns.     Cammie Sickle, MD 08/17/2022 12:24 PM

## 2022-08-17 NOTE — Patient Instructions (Signed)
[facility]  Discharge Instructions: Thank you for choosing [facility] to provide your oncology and hematology care.  If you have a lab appointment with the Cancer Center, please go directly to the Cancer Center and check in at the registration area.  Wear comfortable clothing and clothing appropriate for easy access to any Portacath or PICC line.   We strive to give you quality time with your provider. You may need to reschedule your appointment if you arrive late (15 or more minutes).  Arriving late affects you and other patients whose appointments are after yours.  Also, if you miss three or more appointments without notifying the office, you may be dismissed from the clinic at the provider's discretion.      For prescription refill requests, have your pharmacy contact our office and allow 72 hours for refills to be completed.    Today you received the following chemotherapy and/or immunotherapy agents [name]      To help prevent nausea and vomiting after your treatment, we encourage you to take your nausea medication as directed.  BELOW ARE SYMPTOMS THAT SHOULD BE REPORTED IMMEDIATELY: *FEVER GREATER THAN 100.4 F (38 C) OR HIGHER *CHILLS OR SWEATING *NAUSEA AND VOMITING THAT IS NOT CONTROLLED WITH YOUR NAUSEA MEDICATION *UNUSUAL SHORTNESS OF BREATH *UNUSUAL BRUISING OR BLEEDING *URINARY PROBLEMS (pain or burning when urinating, or frequent urination) *BOWEL PROBLEMS (unusual diarrhea, constipation, pain near the anus) TENDERNESS IN MOUTH AND THROAT WITH OR WITHOUT PRESENCE OF ULCERS (sore throat, sores in mouth, or a toothache) UNUSUAL RASH, SWELLING OR PAIN  UNUSUAL VAGINAL DISCHARGE OR ITCHING   Items with * indicate a potential emergency and should be followed up as soon as possible or go to the Emergency Department if any problems should occur.  Please show the CHEMOTHERAPY ALERT CARD or IMMUNOTHERAPY ALERT CARD at check-in to  the Emergency Department and triage nurse.  Should you have questions after your visit or need to cancel or reschedule your appointment, please contact [facility]  [phone] and follow the prompts.  Office hours are 8:00 a.m. to 4:30 p.m. Monday - Friday. Please note that voicemails left after 4:00 p.m. may not be returned until the following business day.  We are closed weekends and major holidays. You have access to a nurse at all times for urgent questions. Please call the main number to the clinic [phone] and follow the prompts.  For any non-urgent questions, you may also contact your provider using MyChart. We now offer e-Visits for anyone 18 and older to request care online for non-urgent symptoms. For details visit [email or web address].   Also download the MyChart app! Go to the app store, search "MyChart", open the app, select [facility], and log in with your MyChart username and password.  Masks are optional in the cancer centers. If you would like for your care team to wear a mask while they are taking care of you, please let them know. For doctor visits, patients may have with them one support person who is at least 74 years old. At this time, visitors are not allowed in the infusion area.   

## 2022-08-18 LAB — T4: T4, Total: 5.6 ug/dL (ref 4.5–12.0)

## 2022-08-19 ENCOUNTER — Ambulatory Visit: Payer: Medicare Other | Admitting: Internal Medicine

## 2022-08-19 ENCOUNTER — Other Ambulatory Visit: Payer: Medicare Other

## 2022-08-19 ENCOUNTER — Ambulatory Visit: Payer: Medicare Other

## 2022-08-24 ENCOUNTER — Encounter: Payer: Medicare Other | Attending: Nurse Practitioner | Admitting: *Deleted

## 2022-08-24 DIAGNOSIS — C641 Malignant neoplasm of right kidney, except renal pelvis: Secondary | ICD-10-CM | POA: Insufficient documentation

## 2022-08-24 NOTE — Progress Notes (Signed)
Daily Session Note  Patient Details  Name: Gabrielle Savage MRN: [id number] Date of Birth: [birth year] Referring Provider:   [name] [facility] Associated Rehabilitation & Exercise from 05/11/2022 in [facility] Cardiac and Pulmonary Rehab  Referring Provider Charlaine Dalton MD       Encounter Date: 08/24/2022  Check In:  Session Check In - 08/24/22 1224       Check-In   Supervising physician immediately available to respond to emergencies See telemetry face sheet for immediately available ER MD    Location [facility]-Cardiac & Pulmonary Rehab    Staff Present Alberteen Sam, MA, RCEP, CCRP, Bertram Gala, MS, ACSM CEP, Exercise Physiologist    Virtual Visit No    Medication changes reported     No    Fall or balance concerns reported    No    Warm-up and Cool-down Performed on first and last piece of equipment    Resistance Training Performed Yes    VAD Patient? No    PAD/SET Patient? No      Pain Assessment   Currently in Pain? No/denies                Social History   Tobacco Use  Smoking Status Never   Passive exposure: Never  Smokeless Tobacco Never    Goals Met:  Proper associated with RPD/PD & O2 Sat Independence with exercise equipment Exercise tolerated well No report of concerns or symptoms today Strength training completed today  Goals Unmet:  Not Applicable  Comments: Pt able to follow exercise prescription today without complaint.  Will continue to monitor for progression.    Dr. Emily Filbert is Medical Director for [place].  Dr. Ottie Glazier is Medical Director for [facility] Pulmonary Rehabilitation.

## 2022-08-26 DIAGNOSIS — C641 Malignant neoplasm of right kidney, except renal pelvis: Secondary | ICD-10-CM

## 2022-08-26 NOTE — Progress Notes (Signed)
Daily Session Note  Patient Details  Name: Gabrielle Savage MRN: [id number] Date of Birth: [birth year] Referring Provider:   [name] Cancer Associated Rehabilitation & Exercise from 05/11/2022 in [facility] Cardiac and Pulmonary Rehab  Referring Provider Charlaine Dalton MD       Encounter Date: 08/26/2022  Check In:  Session Check In - 08/26/22 1224       Check-In   Supervising physician immediately available to respond to emergencies See telemetry face sheet for immediately available ER MD    Location [facility] Pul[facility] Re[facility]    Staff Present Larna Daughters, MS, ACSM CEP, Exercise Physiologist;Emmily Pellegrin, BS, Exercise Physiologist    Virtual Visit No    Medication changes reported     No    Fall or balance concerns reported    No    Warm-up and Cool-down Performed on first and last piece of equipment    Resistance Training Performed Yes    VAD Patient? No    PAD/SET Patient? No      Pain Assessment   Currently in Pain? No/denies    Multiple Pain Sites No                Social History   Tobacco Use  Smoking Status Never   Passive exposure: Never  Smokeless Tobacco Never    Goals Met:  Independence with exercise equipment Exercise tolerated well No report of concerns or symptoms today  Goals Unmet:  Not Applicable  Comments: Pt able to follow exercise prescription today without complaint.  Will continue to monitor for progression.    Dr. Emily Filbert is Medical Director for [place].  Dr. Ottie Glazier is Medical Director for [facility] Pulmonary Rehabilitation.

## 2022-08-31 ENCOUNTER — Encounter: Payer: Medicare Other | Admitting: *Deleted

## 2022-08-31 ENCOUNTER — Other Ambulatory Visit: Payer: Medicare Other

## 2022-08-31 ENCOUNTER — Ambulatory Visit: Payer: Medicare Other

## 2022-08-31 ENCOUNTER — Ambulatory Visit: Payer: Medicare Other | Admitting: Internal Medicine

## 2022-08-31 DIAGNOSIS — C641 Malignant neoplasm of right kidney, except renal pelvis: Secondary | ICD-10-CM

## 2022-08-31 NOTE — Progress Notes (Signed)
Daily Session Note  Patient Details  Name: Gabrielle Savage MRN: [id number] Date of Birth: 1945/12/19 Referring Provider:   [name] Cancer Associated Rehabilitation & Exercise from 05/11/2022 in [facility] Cardiac and Pulmonary Rehab  Referring Provider Charlaine Dalton MD       Encounter Date: 08/31/2022  Check In:  Session Check In - 08/31/22 1216       Check-In   Supervising physician immediately available to respond to emergencies See telemetry face sheet for immediately available ER MD    Location [facility]Cardiac & Pulmonary Rehab    Staff Present Larna Daughters, MS, ACSM CEP, Exercise Physiologist;Noah Tickle, BS, Exercise Physiologist;Elycia [place] Luan Pulling, MA, RCEP, CCRP, CCET    Virtual Visit No    Medication changes reported     No    Fall or balance concerns reported    No    Warm-up and Cool-down Performed on first and last piece of equipment    Resistance Training Performed Yes    VAD Patient? No    PAD/SET Patient? No      Pain Assessment   Currently in Pain? No/denies                Social History   Tobacco Use  Smoking Status Never   Passive exposure: Never  Smokeless Tobacco Never    Goals Met:  Proper associated with RPD/PD & O2 Sat Independence with exercise equipment Using PLB without cueing & demonstrates good technique Exercise tolerated well No report of concerns or symptoms today Strength training completed today  Goals Unmet:  Not Applicable  Comments: Pt able to follow exercise prescription today without complaint.  Will continue to monitor for progression.    Dr. Emily Filbert is Medical Director for [place].  Dr. Ottie Glazier is Medical Director for [facility] Pulmonary Rehabilitation.

## 2022-09-02 DIAGNOSIS — C641 Malignant neoplasm of right kidney, except renal pelvis: Secondary | ICD-10-CM

## 2022-09-02 NOTE — Progress Notes (Signed)
Daily Session Note  Patient Details  Name: Gabrielle Savage MRN: [id number] Date of Birth: 1946-03-11 Referring Provider:   [name] Cancer Associated Rehabilitation & Exercise from 05/11/2022 in [facility] Cardiac and Pulmonary Rehab  Referring Provider Charlaine Dalton MD       Encounter Date: 09/02/2022  Check In:  Session Check In - 09/02/22 1222       Check-In   Supervising physician immediately available to respond to emergencies See telemetry face sheet for immediately available ER MD    Location [facility]Card[facility] & Pulmonary Rehab    Staff Present Antionette Fairy, BS, Exercise Physiologist;Susanne Bice, RN, BSN, CCRP    Virtual Visit No    Medication changes reported     No    Fall or balance concerns reported    No    Warm-up and Cool-down Performed on first and last piece of equipment    Resistance Training Performed Yes    VAD Patient? No    PAD/SET Patient? No      Pain Assessment   Currently in Pain? No/denies    Multiple Pain Sites No                Social History   Tobacco Use  Smoking Status Never   Passive exposure: Never  Smokeless Tobacco Never    Goals Met:  Independence with exercise equipment Exercise tolerated well No report of concerns or symptoms today  Goals Unmet:  Not Applicable  Comments: Pt able to follow exercise prescription today without complaint.  Will continue to monitor for progression.    Dr. Emily Filbert is Medical Director for [place].  Dr. Ottie Glazier is Medical Director for [facility] Pulmonary Rehabilitation.

## 2022-09-07 ENCOUNTER — Inpatient Hospital Stay: Payer: Medicare Other

## 2022-09-07 ENCOUNTER — Inpatient Hospital Stay (HOSPITAL_BASED_OUTPATIENT_CLINIC_OR_DEPARTMENT_OTHER): Payer: Medicare Other | Admitting: Internal Medicine

## 2022-09-07 ENCOUNTER — Encounter: Payer: Self-pay | Admitting: Internal Medicine

## 2022-09-07 VITALS — BP 138/56 | HR 65 | Temp 98.2°F | Resp 16 | Wt 186.4 lb

## 2022-09-07 DIAGNOSIS — R946 Abnormal results of thyroid function studies: Secondary | ICD-10-CM

## 2022-09-07 DIAGNOSIS — C641 Malignant neoplasm of right kidney, except renal pelvis: Secondary | ICD-10-CM

## 2022-09-07 DIAGNOSIS — Z5112 Encounter for antineoplastic immunotherapy: Secondary | ICD-10-CM | POA: Diagnosis not present

## 2022-09-07 LAB — CBC WITH DIFFERENTIAL/PLATELET
Abs Immature Granulocytes: 0.02 10*3/uL (ref 0.00–0.07)
Basophils Absolute: 0 10*3/uL (ref 0.0–0.1)
Basophils Relative: 1 %
Eosinophils Absolute: 0.1 10*3/uL (ref 0.0–0.5)
Eosinophils Relative: 2 %
HCT: 36.6 % (ref 36.0–46.0)
Hemoglobin: 11.6 g/dL — ABNORMAL LOW (ref 12.0–15.0)
Immature Granulocytes: 0 %
Lymphocytes Relative: 16 %
Lymphs Abs: 1 10*3/uL (ref 0.7–4.0)
MCH: 29.7 pg (ref 26.0–34.0)
MCHC: 31.7 g/dL (ref 30.0–36.0)
MCV: 93.8 fL (ref 80.0–100.0)
Monocytes Absolute: 0.5 10*3/uL (ref 0.1–1.0)
Monocytes Relative: 9 %
Neutro Abs: 4.4 10*3/uL (ref 1.7–7.7)
Neutrophils Relative %: 72 %
Platelets: 154 10*3/uL (ref 150–400)
RBC: 3.9 MIL/uL (ref 3.87–5.11)
RDW: 13.8 % (ref 11.5–15.5)
WBC: 6 10*3/uL (ref 4.0–10.5)
nRBC: 0 % (ref 0.0–0.2)

## 2022-09-07 LAB — COMPREHENSIVE METABOLIC PANEL
ALT: 11 U/L (ref 0–44)
AST: 13 U/L — ABNORMAL LOW (ref 15–41)
Albumin: 4 g/dL (ref 3.5–5.0)
Alkaline Phosphatase: 88 U/L (ref 38–126)
Anion gap: 5 (ref 5–15)
BUN: 55 mg/dL — ABNORMAL HIGH (ref 8–23)
CO2: 21 mmol/L — ABNORMAL LOW (ref 22–32)
Calcium: 9.5 mg/dL (ref 8.9–10.3)
Chloride: 113 mmol/L — ABNORMAL HIGH (ref 98–111)
Creatinine, Ser: 2.3 mg/dL — ABNORMAL HIGH (ref 0.44–1.00)
GFR, Estimated: 22 mL/min — ABNORMAL LOW (ref >60)
Glucose, Bld: 133 mg/dL — ABNORMAL HIGH (ref 70–99)
Potassium: 4.9 mmol/L (ref 3.5–5.1)
Sodium: 139 mmol/L (ref 135–145)
Total Bilirubin: 0.4 mg/dL (ref 0.3–1.2)
Total Protein: 6.8 g/dL (ref 6.5–8.1)

## 2022-09-07 LAB — TSH: TSH: 1.981 u[IU]/mL (ref 0.350–4.500)

## 2022-09-07 MED ORDER — TRIAMCINOLONE ACETONIDE 0.5 % EX OINT: 1.0000 | TOPICAL_OINTMENT | Freq: Two times a day (BID) | CUTANEOUS | 1 refills | Status: DC

## 2022-09-07 NOTE — Assessment & Plan Note (Addendum)
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  Tolerating [name] fairly well-except for mild skin rash see below.  # HOLD [name] #11 today-given the ongoing itching/rash. Labs today reviewed;  DEC 2023- TSH-WNL.    # Skin rash- ? [name] vs CKD- Phos- 4.8; Recommed continue kenalog ointment.  Also recommend antihistamine to Claritin. HOLD off PO steroids [DM]  # Intermittently- elevated calcium- 10.4; STOP vit D 50k; today calcium is 10.0.  Vit D levels- 69 [SEP 2023]- STABLE.  # Anemia: Hemoglobin around 11-12 overall stable/improving; on PO iron every other day. NOV 2023- Iron sat-35%; ferretin- 330- STABLE.    #CKD stage IV- GFR ~22; Continue enough hydration. Monitor closely. [s/p [facility] evaluation on June 1st, 2023]- STABLE.   #Diabetes: Blood sugars 133- patient on metformin on glimepiride [as per nephrology]STABLE.   # IV ACCESS: PIV.    # DISPOSITION: # HOLD [name] today # follow up in 3 weeks/Tuesday or Wednesday - MD; labs- cbc/cmp;  [name]- Dr.B

## 2022-09-07 NOTE — Progress Notes (Signed)
Rash is improving but still bad especially with itching.

## 2022-09-07 NOTE — Progress Notes (Signed)
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  Histologic Grade (WHO / ISUP Grade): G3, nucleoli conspicuous and  eosinophilic at 443 X magnification  Tumor Extent: Extends into major vein (segmental branch adjacent to  renal sinus)  Sarcomatoid Features: Not identified  Rhabdoid Features: Not identified  Tumor Necrosis: Present, microscopic and macroscopic, 20%   MARGINS  Margin Status: All margins negative for invasive carcinoma   REGIONAL LYMPH NODES  Regional Lymph Node Status: Not applicable (no regional lymph nodes  submitted or found)   DISTANT METASTASIS  Distant Site(s) Involved, if applicable: Not applicable   PATHOLOGIC STAGE CLASSIFICATION (pTNM, AJCC 8th Edition):  TNM Descriptors: Applicable  XV4M  pN - Not assigned (no lymph nodes submitted or found)  pM - Not applicable   # GQ6P- stage III; G-3; no sarcomatoid features [right nephrectomy- Dr.Sninksi].  # FEB 14th, 2023- CT AP [GI; [facility]]- . Large mass exophytic extending from lower to mid pole of the RIGHT kidney consistent with renal neoplasm. [Dr.Sninski; [facility]]  # FEB 2023- Right lower lobe subpleural 4 mm lung nodule; bone survey negative  #April 7th, 2023- On adjuvant [name]    # CKD III [ sec to HTN > 20 years; none now]; Anemia- EGD/colo- 2023- [[facility]]; Dr.Kowalski [No CAD]   Clear cell renal cell carcinoma, right ([place])  12/12/2021 Initial Diagnosis   Clear cell renal cell carcinoma, right ([place])   12/18/2021 - 04/05/2022 Chemotherapy   Patient is on Treatment Plan : RENAL CELL Pembrolizumab (200) q21d     12/18/2021 -  Chemotherapy   Patient is on  Treatment Plan : kidney adjuvant Pembrolizumab (200) q21d     01/08/2022 Cancer Staging   Staging form: Kidney, AJCC 8th Edition - Pathologic: Stage III (pT3a, pN0, cM0) - Signed by Cammie Sickle, MD on 01/08/2022    HISTORY OF PRESENTING ILLNESS: Ambulating independently.  Accompanied by husband.   Troy Sine 76 y.o.  female with history of stage III kidney cancer; CKD stage III currently on adjuvant Beryle Flock is here for follow-up.    Rash is improving but still bad especially with itching. Patient states she is really itchy on her back.  Some improvement with kenalog.   Denies any worsening shortness of breath or cough.  Denies any worsening joint pains.  No nausea no vomiting.  Review of Systems  Constitutional:  Positive for malaise/fatigue. Negative for chills and fever.  HENT:  Negative for nosebleeds and sore throat.   Eyes:  Negative for double vision.  Respiratory:  Negative for cough, hemoptysis, sputum production and wheezing.   Cardiovascular:  Negative for chest pain, palpitations, orthopnea and leg swelling.  Gastrointestinal:  Negative for abdominal pain, blood in stool, constipation, diarrhea, heartburn, melena, nausea and vomiting.  Genitourinary:  Negative for dysuria, frequency and urgency.  Musculoskeletal:  Positive for back pain and joint pain.  Skin:  Positive for itching and rash.  Neurological:  Negative for dizziness, tingling, focal weakness, weakness and headaches.  Endo/Heme/Allergies:  Does not bruise/bleed easily.  Psychiatric/Behavioral:  Negative for depression. The patient is not nervous/anxious and does not have insomnia.      MEDICAL HISTORY:  Past Medical History:  Diagnosis  Date   Aortic atherosclerosis (HCC)    Arthritis    B12 deficiency    Bradycardia    CAD (coronary artery disease)    Carotid atherosclerosis    CKD (chronic kidney disease), stage III (HCC)    Diastolic dysfunction 18/84/1660   a.)  TTE 09/03/2021: EF 63%;  no RWMAs; LA mildly enlarged; trivial TR/PR, mild AR, moderate MR; G1DD.   DOE (dyspnea on exertion)    Fatigue    Gout    HLD (hyperlipidemia)    Hypertension    IDA (iron deficiency anemia)    Iliotibial band syndrome, left leg    MDD (major depressive disorder)    OAB (overactive bladder)    Obesity    PAC (premature atrial contraction)    noted on Holter   PONV (postoperative nausea and vomiting)    a.) single episode in 03/2013   PVC (premature ventricular contraction)    noted on Holter   Right kidney mass 10/27/2021   a.) CT 10/27/2021 -- mass measuring 8.3 x 6.8 x 8.1 cm   Sensorineural hearing loss (SNHL) of both ears    SUI (stress urinary incontinence, female)    SVT (supraventricular tachycardia)    noted on Holter   T2DM (type 2 diabetes mellitus) ([place])    Uterovaginal prolapse    Vitamin D deficiency    Voiding dysfunction     SURGICAL HISTORY: Past Surgical History:  Procedure Laterality Date   ABDOMINAL HYSTERECTOMY     partial   BLADDER SUSPENSION     CHOLECYSTECTOMY     COLONOSCOPY N/A 07/25/2017   Procedure: COLONOSCOPY;  Surgeon: [name], MD;  Location[facility] ENDOSCOPY;  Service: Endoscopy;  Laterality: N/A;   COLONOSCOPY WITH PROPOFOL N/A 10/19/2021   Procedure: COLONOSCOPY WITH PROPOFOL;  Surgeon: [name], MD;  Location: [facility] ENDOSCOPY;  Service: Endoscopy;  Laterality: N/A;  DM   ESOPHAGOGASTRODUODENOSCOPY (EGD) WITH PROPOFOL N/A 10/19/2021   Procedure: ESOPHAGOGASTRODUODENOSCOPY (EGD) WITH PROPOFOL;  Surgeon: [name], MD;  Location: [facility] ENDOSCOPY;  Service: Endoscopy;  Laterality: N/A;   JOINT REPLACEMENT Bilateral    PARTIAL KNEE REPLACEMENT   KNEE SURGERY     LAPAROSCOPIC NEPHRECTOMY, HAND ASSISTED Right 11/20/2021   Procedure: HAND ASSISTED LAPAROSCOPIC RADICAL NEPHRECTOMY;  Surgeon: Billey Co, MD;  Location: [facility] ORS;  Service: Urology;  Laterality: Right;   PARATHYROIDECTOMY      SOCIAL  HISTORY: Social History   Socioeconomic History   Marital status: Married    Spouse name: Not on file   Number of children: Not on file   Years of education: Not on file   Highest education level: Not on file  Occupational History   Not on file  Tobacco Use   Smoking status: Never    Passive exposure: Never   Smokeless tobacco: Never  Vaping Use   Vaping Use: Never used  Substance and Sexual Activity   Alcohol use: Not Currently    Comment: occ   Drug use: No   Sexual activity: Not Currently  Other Topics Concern   Not on file  Social History Narrative   Lives in [place] - close hospital; with husband; 3 biological children [[place]; [place]; [place]]. Never smoked; glass of wine rare. 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    Social Determinants of Health   Financial Resource Strain: Low Risk  (12/07/2021)   Overall Financial Resource Strain (CARDIA)    Difficulty of Paying Living Expenses: Not hard at all  Food Insecurity:  No Food Insecurity (12/07/2021)   Hunger Vital Sign    Worried About Running Out of Food in the Last Year: Never true    Ran Out of Food in the Last Year: Never true  Transportation Needs: No Transportation Needs (12/07/2021)   PRAPARE - Hydrologist (Medical): No    Lack of Transportation (Non-Medical): No  Physical Activity: Insufficiently Active (12/07/2021)   Exercise Vital Sign    Days of Exercise per Week: 2 days    Minutes of Exercise per Session: 30 min  Stress: No Stress Concern Present (12/07/2021)   [place]    Feeling of Stress : Only a little  Social Connections: Moderately Integrated (12/07/2021)   Social Connection and Isolation Panel [NHANES]    Frequency of Communication with Friends and Family: Three times a week    Frequency of Social Gatherings with Friends and Family: Three times a week    Attends Religious Services: 1 to 4 times per  year    Active Member of Clubs or Organizations: No    Attends Archivist Meetings: Never    Marital Status: Married  Human resources officer Violence: Not At Risk (12/07/2021)   Humiliation, Afraid, Rape, and Kick questionnaire    Fear of Current or Ex-Partner: No    Emotionally Abused: No    Physically Abused: No    Sexually Abused: No    FAMILY HISTORY: Family History  Problem Relation Age of Onset   Heart failure Mother    Stroke Father    Lung cancer Father    Breast cancer Neg Hx     ALLERGIES:  is allergic to codeine, nsaids, and oxycodone.  MEDICATIONS:  Current Outpatient Medications  Medication Sig Dispense Refill   allopurinol (ZYLOPRIM) 100 MG tablet Take 300 mg by mouth every morning. Pt has decreased to '100mg'$      amLODipine (NORVASC) 5 MG tablet Take 5 mg by mouth every morning.     citalopram (CELEXA) 10 MG tablet Take 10 mg by mouth every morning.     ferrous sulfate 325 (65 FE) MG tablet Take 325 mg by mouth every other day.     irbesartan (AVAPRO) 150 MG tablet Take 150 mg by mouth every morning.     vitamin B-12 (CYANOCOBALAMIN) 1000 MCG tablet Take 1,000 mcg by mouth daily.     docusate sodium (COLACE) 100 MG capsule Take 1 capsule (100 mg total) by mouth 2 (two) times daily. (Patient not taking: Reported on 07/06/2022) 20 capsule 0   triamcinolone ointment (KENALOG) 0.5 % Apply 1 Application topically 2 (two) times daily. 30 g 1   No current facility-administered medications for this visit.      Marland Kitchen  PHYSICAL EXAMINATION:  Vitals:   09/07/22 1300  BP: (!) 138/56  Pulse: 65  Resp: 16  Temp: 98.2 F (36.8 C)   Filed Weights   09/07/22 1300  Weight: 186 lb 6.4 oz (84.6 kg)   Mild maculopapular rash noted in the back and also front.   Physical Exam Vitals and nursing note reviewed.  HENT:     Head: Normocephalic and atraumatic.     Mouth/Throat:     Pharynx: Oropharynx is clear.  Eyes:     Extraocular Movements: Extraocular movements  intact.     Pupils: Pupils are equal, round, and reactive to light.  Cardiovascular:     Rate and Rhythm: Normal rate and regular rhythm.  Pulmonary:  Comments: Decreased breath sounds bilaterally.  Abdominal:     Palpations: Abdomen is soft.  Musculoskeletal:        General: Normal range of motion.     Cervical back: Normal range of motion.  Skin:    General: Skin is warm.  Neurological:     General: No focal deficit present.     Mental Status: She is alert and oriented to person, place, and time.  Psychiatric:        Behavior: Behavior normal.        Judgment: Judgment normal.      LABORATORY DATA:  I have reviewed the data as listed Lab Results  Component Value Date   WBC 6.0 09/07/2022   HGB 11.6 (L) 09/07/2022   HCT 36.6 09/07/2022   MCV 93.8 09/07/2022   PLT 154 09/07/2022   Recent Labs    07/27/22 0956 08/17/22 0941 09/07/22 1314  NA 137 140 139  K 4.4 4.2 4.9  CL 107 112* 113*  CO2 23 20* 21*  GLUCOSE 127* 117* 133*  BUN 63* 60* 55*  CREATININE 2.44* 2.16* 2.30*  CALCIUM 10.1 9.7 9.5  GFRNONAA 20* 23* 22*  PROT 7.2 6.8 6.8  ALBUMIN 4.2 3.9 4.0  AST 17 14* 13*  ALT '15 13 11  '$ ALKPHOS 92 90 88  BILITOT 0.5 0.3 0.4    RADIOGRAPHIC STUDIES: I have personally reviewed the radiological images as listed and agreed with the findings in the report. No results found.  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  Tolerating [name] fairly well-except for mild skin rash see below.  # HOLD [name] #11 today. Labs today reviewed;  DEC 2023- TSH-WNL.    # Skin rash- ? [name] vs CKD- Phos- 4.8; Recommed continue kenalog ointment.  Also recommend antihistamine to Claritin. HOLD off PO steroids [DM]  # Intermittently- elevated calcium- 10.4; STOP vit D 50k; today calcium is 10.0.  Vit D levels- 69 [SEP 2023]- STABLE.  # Anemia: Hemoglobin around 11-12 overall stable/improving; on PO iron  every other day. NOV 2023- Iron sat-35%; ferretin- 330- STABLE.    #CKD stage IV- GFR ~22; Continue enough hydration. Monitor closely. [s/p [facility] evaluation on June 1st, 2023]- STABLE.   #Diabetes: Blood sugars 133- patient on metformin on glimepiride [as per nephrology]STABLE.   # IV ACCESS: PIV.    # DISPOSITION: # HOLD [name] today # follow up in 3 weeks/Tuesday or Wednesday - MD; labs- cbc/cmp;  Beryle Flock- Dr.B      All questions were answered. The patient knows to call the clinic with any problems, questions or concerns.     Cammie Sickle, MD 09/07/2022 2:51 PM

## 2022-09-09 ENCOUNTER — Ambulatory Visit: Payer: Medicare Other | Admitting: Internal Medicine

## 2022-09-09 ENCOUNTER — Other Ambulatory Visit: Payer: Medicare Other

## 2022-09-09 ENCOUNTER — Ambulatory Visit: Payer: Medicare Other

## 2022-09-09 ENCOUNTER — Encounter: Payer: Medicare Other | Admitting: *Deleted

## 2022-09-09 VITALS — Ht 63.0 in | Wt 186.6 lb

## 2022-09-09 DIAGNOSIS — C641 Malignant neoplasm of right kidney, except renal pelvis: Secondary | ICD-10-CM

## 2022-09-09 NOTE — Progress Notes (Signed)
Daily Session Note  Patient Details  Name: Gabrielle Savage MRN: [id number] Date of Birth: Aug 12, 1946 Referring Provider:   [name] Cancer Associated Rehabilitation & Exercise from 05/11/2022 in [facility] Cardiac [facility] Pulmonary Rehab  Referring Provider Charlaine Dalton MD       Encounter Date: 09/09/2022  Check In:  Session Check In - 09/09/22 1221       Check-In   Supervising physician immediately available to respond to emergencies See telemetry face sheet for immediately available ER MD    Location [facility]-Cardiac & Pulmonary Rehab    Staff Present Alberteen Sam, MA, RCEP, CCRP, Bertram Gala, MS, ACSM CEP, Exercise Physiologist    Virtual Visit No    Medication changes reported     No    Fall or balance concerns reported    No    Warm-up and Cool-down Performed on first and last piece of equipment    Resistance Training Performed Yes    VAD Patient? No    PAD/SET Patient? No      Pain Assessment   Currently in Pain? No/denies              6 Minute Walk     Row Name 05/11/22 1413 09/09/22 1246       6 Minute Walk   Phase Initial Discharge    Distance 1250 feet 1300 feet    Distance % Change -- 4 %    Distance Feet Change -- 50 ft    Walk Time 6 minutes 6 minutes    # of Rest Breaks 0 0    MPH 2.36 2.46    METS 2.57 2.51    RPE 11 9    Perceived Dyspnea  0 0    VO2 Peak 9.01 8.79    Symptoms No No    Resting HR 62 bpm 63 bpm    Resting BP 112/58 124/62    Resting Oxygen Saturation  97 % 97 %    Exercise Oxygen Saturation  during 6 min walk 96 % 96 %    Max Ex. HR 101 bpm 102 bpm    Max Ex. BP 162/56 154/58    2 Minute Post BP 124/60 --               Social History   Tobacco Use  Smoking Status Never   Passive exposure: Never  Smokeless Tobacco Never    Goals Met:  Proper associated with RPD/PD & O2 Sat Independence with exercise equipment Exercise tolerated well No report of concerns or symptoms today Strength training  completed today  Goals Unmet:  Not Applicable  Comments: Pt able to follow exercise prescription today without complaint.  Will continue to monitor for progression.    Dr. Emily Filbert is Medical Director for [facility].  Dr. Ottie Glazier is Medical Director for [facility] Pulmonary Rehabilitation.

## 2022-09-14 ENCOUNTER — Encounter: Payer: Medicare Other | Attending: Nurse Practitioner

## 2022-09-14 DIAGNOSIS — C641 Malignant neoplasm of right kidney, except renal pelvis: Secondary | ICD-10-CM

## 2022-09-14 NOTE — Progress Notes (Signed)
Daily Session Note  Patient Details  Name: Gabrielle Savage MRN: [id number] Date of Birth: 1946/01/15 Referring Provider:   [name] Cancer Associated Rehabilitation & Exercise from 05/11/2022 in [facility] Cardiac and Pulmonary Rehab  Referring Provider Charlaine Dalton MD       Encounter Date: 09/14/2022  Check In:  Session Check In - 09/14/22 1626       Check-In   Supervising physician immediately available to respond to emergencies See telemetry face sheet for immediately available ER MD    Location [facility]-Cardiac & Pulmonary Rehab    Staff Present Larna Daughters, MS, ACSM CEP, Exercise Physiologist;Jessica Luan Pulling, MA, RCEP, CCRP, CCET    Virtual Visit No    Medication changes reported     No    Fall or balance concerns reported    No    Warm-up and Cool-down Performed on first and last piece of equipment    Resistance Training Performed Yes    VAD Patient? No    PAD/SET Patient? No                Social History   Tobacco Use  Smoking Status Never   Passive exposure: Never  Smokeless Tobacco Never    Goals Met:  Proper associated with RPD/PD & O2 Sat Independence with exercise equipment Exercise tolerated well No report of concerns or symptoms today Strength training completed today  Goals Unmet:  Not Applicable  Comments: Pt able to follow exercise prescription today without complaint.  Will continue to monitor for progression.    Dr. Emily Filbert is Medical Director for [facility].  Dr. Ottie Glazier is Medical Director for [facility] Pulmonary Rehabilitation.

## 2022-09-15 NOTE — Patient Instructions (Signed)
Discharge Patient Instructions  Patient Details  Name: Gabrielle Savage MRN: [id number] Date of Birth: Nov 16, 1945 Referring Provider:  Tracie Harrier, MD   Number of Visits: 24  Reason for Discharge:  Patient reached a stable level of exercise. Patient independent in their exercise. Patient has met program and personal goals.  Smoking History:  Social History   Tobacco Use  Smoking Status Never   Passive exposure: Never  Smokeless Tobacco Never    Diagnosis:  Clear cell renal cell carcinoma, right (HCC)  Initial Exercise Prescription:  Initial Exercise Prescription - 05/11/22 1400       Date of Initial Exercise RX and Referring Provider   Date 05/11/22    Referring Provider Charlaine Dalton MD      Oxygen   Maintain Oxygen Saturation 88% or higher      Treadmill   MPH 2.1    Grade 0.5    Minutes 15    METs 2.75      NuStep   Level 2    SPM 80    Minutes 15    METs 2.5      REL-XR   Level 1    Speed 50    Minutes 15    METs 2.5      Biostep-RELP   Level 1    SPM 50    Minutes 15    METs 2.5      Prescription Details   Frequency (times per week) 2    Duration Progress to 30 minutes of continuous aerobic without signs/symptoms of physical distress      Intensity   THRR 40-80% of Max Heartrate 95 - 129    Ratings of Perceived Exertion 11-13    Perceived Dyspnea 0-4      Progression   Progression Continue to progress workloads to maintain intensity without signs/symptoms of physical distress.      Resistance Training   Training Prescription Yes    Weight 3 lb    Reps 10-15             Discharge Exercise Prescription (Final Exercise Prescription Changes):  Exercise Prescription Changes - 09/15/22 1300       Response to Exercise   Blood Pressure (Admit) 124/62    Blood Pressure (Exercise) 154/58    Blood Pressure (Exit) 116/64    Heart Rate (Admit) 63 bpm    Heart Rate (Exercise) 106 bpm    Heart Rate (Exit) 69 bpm    Rating  of Perceived Exertion (Exercise) 13    Symptoms none    Duration Continue with 30 min of aerobic exercise without signs/symptoms of physical distress.    Intensity THRR unchanged      Progression   Progression Continue to progress workloads to maintain intensity without signs/symptoms of physical distress.    Average METs 2.6      Resistance Training   Training Prescription Yes    Weight 4 lb    Reps 10-15      Interval Training   Interval Training No      NuStep   Level 4    Minutes 15    METs 2.8      Biostep-RELP   Level 4    Minutes 15    METs 3      Track   Laps 42    Minutes 15    METs 3.28      Oxygen   Maintain Oxygen Saturation 88% or higher  Functional Capacity:  6 Minute Walk     Row Name 05/11/22 1413 09/09/22 1246       6 Minute Walk   Phase Initial Discharge    Distance 1250 feet 1300 feet    Distance % Change -- 4 %    Distance Feet Change -- 50 ft    Walk Time 6 minutes 6 minutes    # of Rest Breaks 0 0    MPH 2.36 2.46    METS 2.57 2.51    RPE 11 9    Perceived Dyspnea  0 0    VO2 Peak 9.01 8.79    Symptoms No No    Resting HR 62 bpm 63 bpm    Resting BP 112/58 124/62    Resting Oxygen Saturation  97 % 97 %    Exercise Oxygen Saturation  during 6 min walk 96 % 96 %    Max Ex. HR 101 bpm 102 bpm    Max Ex. BP 162/56 154/58    2 Minute Post BP 124/60 --             Nutrition & Weight - Outcomes:  Pre Biometrics - 05/11/22 1408       Pre Biometrics   Height _0  (1.6 m)    Weight 175 lb 4.8 oz (79.5 kg)    BMI (Calculated) 31.06    Single Leg Stand 8.3 seconds             Post Biometrics - 09/09/22 1248        Post  Biometrics   Height _1  (1.6 m)    Weight 186 lb 9.6 oz (84.6 kg)    BMI (Calculated) 33.06    Single Leg Stand 4.3 seconds              Goals reviewed with patient; copy given to patient.

## 2022-09-16 DIAGNOSIS — C641 Malignant neoplasm of right kidney, except renal pelvis: Secondary | ICD-10-CM

## 2022-09-16 NOTE — Progress Notes (Signed)
CARE Discharge Progress Report  Patient Details  Name: Gabrielle Savage MRN: [id number] Date of Birth: 1946/05/27 Referring Provider:   [name] Cancer Associated Rehabilitation & Exercise from 05/11/2022 in [facility] Cardiac and Pulmonary Rehab  Referring Provider Charlaine Dalton MD        Number of Visits: 24  Reason for Discharge:  Patient reached a stable level of exercise. Patient independent in their exercise. Patient has met program and personal goals.  Smoking History:  Social History   Tobacco Use  Smoking Status Never   Passive exposure: Never  Smokeless Tobacco Never    Diagnosis:  Clear cell renal cell carcinoma, right (HCC)  ADL UCSD:   Initial Exercise Prescription:  Initial Exercise Prescription - 05/11/22 1400       Date of Initial Exercise RX and Referring Provider   Date 05/11/22    Referring Provider Charlaine Dalton MD      Oxygen   Maintain Oxygen Saturation 88% or higher      Treadmill   MPH 2.1    Grade 0.5    Minutes 15    METs 2.75      NuStep   Level 2    SPM 80    Minutes 15    METs 2.5      REL-XR   Level 1    Speed 50    Minutes 15    METs 2.5      Biostep-RELP   Level 1    SPM 50    Minutes 15    METs 2.5      Prescription Details   Frequency (times per week) 2    Duration Progress to 30 minutes of continuous aerobic without signs/symptoms of physical distress      Intensity   THRR 40-80% of Max Heartrate 95 - 129    Ratings of Perceived Exertion 11-13    Perceived Dyspnea 0-4      Progression   Progression Continue to progress workloads to maintain intensity without signs/symptoms of physical distress.      Resistance Training   Training Prescription Yes    Weight 3 lb    Reps 10-15             Discharge Exercise Prescription (Final Exercise Prescription Changes):  Exercise Prescription Changes - 09/15/22 1300       Response to Exercise   Blood Pressure (Admit) 124/62    Blood  Pressure (Exercise) 154/58    Blood Pressure (Exit) 116/64    Heart Rate (Admit) 63 bpm    Heart Rate (Exercise) 106 bpm    Heart Rate (Exit) 69 bpm    Rating of Perceived Exertion (Exercise) 13    Symptoms none    Duration Continue with 30 min of aerobic exercise without signs/symptoms of physical distress.    Intensity THRR unchanged      Progression   Progression Continue to progress workloads to maintain intensity without signs/symptoms of physical distress.    Average METs 2.6      Resistance Training   Training Prescription Yes    Weight 4 lb    Reps 10-15      Interval Training   Interval Training No      NuStep   Level 4    Minutes 15    METs 2.8      Biostep-RELP   Level 4    Minutes 15    METs 3      Track   Laps 42  Minutes 15    METs 3.28      Oxygen   Maintain Oxygen Saturation 88% or higher             Functional Capacity:  6 Minute Walk     Row Name 05/11/22 1413 09/09/22 1246       6 Minute Walk   Phase Initial Discharge    Distance 1250 feet 1300 feet    Distance % Change -- 4 %    Distance Feet Change -- 50 ft    Walk Time 6 minutes 6 minutes    # of Rest Breaks 0 0    MPH 2.36 2.46    METS 2.57 2.51    RPE 11 9    Perceived Dyspnea  0 0    VO2 Peak 9.01 8.79    Symptoms No No    Resting HR 62 bpm 63 bpm    Resting BP 112/58 124/62    Resting Oxygen Saturation  97 % 97 %    Exercise Oxygen Saturation  during 6 min walk 96 % 96 %    Max Ex. HR 101 bpm 102 bpm    Max Ex. BP 162/56 154/58    2 Minute Post BP 124/60 --             Nutrition & Weight - Outcomes:  Pre Biometrics - 05/11/22 1408       Pre Biometrics   Height _0  (1.6 m)    Weight 175 lb 4.8 oz (79.5 kg)    BMI (Calculated) 31.06    Single Leg Stand 8.3 seconds             Post Biometrics - 09/09/22 1248        Post  Biometrics   Height _1  (1.6 m)    Weight 186 lb 9.6 oz (84.6 kg)    BMI (Calculated) 33.06    Single Leg Stand 4.3  seconds             Goals reviewed with patient; copy given to patient.

## 2022-09-16 NOTE — Progress Notes (Addendum)
Daily Session Note  Patient Details  Name: Gabrielle Savage MRN: [id number] Date of Birth: [birth year] Referring Provider:   [name] Exercise from 05/11/2022 in [facility] Cardiac [facility] Pulmonary Re[facility]  Referring Provider Charlaine Dalton MD       Encounter Date: 09/16/2022  Check In:  Session Check In - 09/16/22 1222       Check-In   Supervising physician immediately available to respond to emergencies See telemetry face sheet for immediately available ER MD    Location [facility]-Cardiac & Pulmonary Rehab    Staff Present Larna Daughters, MS, ACSM CEP, Exercise Physiologist;Deondra Labrador, BS, Exercise Physiologist    Virtual Visit No    Medication changes reported     No    Fall or balance concerns reported    No    Tobacco Cessation No Change    Warm-up and Cool-down Performed on first and last piece of equipment    Resistance Training Performed Yes    VAD Patient? No    PAD/SET Patient? No      Pain Assessment   Currently in Pain? No/denies    Multiple Pain Sites No                Social History   Tobacco Use  Smoking Status Never   Passive exposure: Never  Smokeless Tobacco Never    Goals Met:  Independence with exercise equipment Exercise tolerated well No report of concerns or symptoms today  Goals Unmet:  Not Applicable  Comments: Pt able to follow exercise prescription today without complaint.  Will continue to monitor for progression.   _0 @ graduated today from  rehab with 24 sessions completed.  Details of the patient's exercise prescription and what She needs to do in order to continue the prescription and progress were discussed with patient.  Patient was given a copy of prescription and goals.  Patient verbalized understanding.  _1 @ plans to continue to exercise by walking.  Dr. Emily Filbert is Medical Director for [facility].  Dr. Ottie Glazier is Medical Director for [facility]  Pulmonary Rehabilitation.

## 2022-09-28 ENCOUNTER — Inpatient Hospital Stay (HOSPITAL_BASED_OUTPATIENT_CLINIC_OR_DEPARTMENT_OTHER): Payer: Medicare Other | Admitting: Internal Medicine

## 2022-09-28 ENCOUNTER — Ambulatory Visit: Payer: Medicare Other | Admitting: Internal Medicine

## 2022-09-28 ENCOUNTER — Inpatient Hospital Stay: Payer: Medicare Other | Attending: Internal Medicine

## 2022-09-28 ENCOUNTER — Other Ambulatory Visit: Payer: Medicare Other

## 2022-09-28 ENCOUNTER — Ambulatory Visit: Payer: Medicare Other

## 2022-09-28 ENCOUNTER — Inpatient Hospital Stay: Payer: Medicare Other

## 2022-09-28 ENCOUNTER — Encounter: Payer: Self-pay | Admitting: Internal Medicine

## 2022-09-28 VITALS — BP 130/52 | HR 64 | Temp 98.6°F | Resp 19 | Wt 191.0 lb

## 2022-09-28 DIAGNOSIS — Z79899 Other long term (current) drug therapy: Secondary | ICD-10-CM | POA: Diagnosis not present

## 2022-09-28 DIAGNOSIS — C641 Malignant neoplasm of right kidney, except renal pelvis: Secondary | ICD-10-CM

## 2022-09-28 DIAGNOSIS — Z5112 Encounter for antineoplastic immunotherapy: Secondary | ICD-10-CM | POA: Diagnosis present

## 2022-09-28 DIAGNOSIS — R946 Abnormal results of thyroid function studies: Secondary | ICD-10-CM | POA: Diagnosis not present

## 2022-09-28 LAB — COMPREHENSIVE METABOLIC PANEL
ALT: 11 U/L (ref 0–44)
AST: 15 U/L (ref 15–41)
Albumin: 3.9 g/dL (ref 3.5–5.0)
Alkaline Phosphatase: 101 U/L (ref 38–126)
Anion gap: 9 (ref 5–15)
BUN: 49 mg/dL — ABNORMAL HIGH (ref 8–23)
CO2: 21 mmol/L — ABNORMAL LOW (ref 22–32)
Calcium: 9.1 mg/dL (ref 8.9–10.3)
Chloride: 108 mmol/L (ref 98–111)
Creatinine, Ser: 2.43 mg/dL — ABNORMAL HIGH (ref 0.44–1.00)
GFR, Estimated: 20 mL/min — ABNORMAL LOW (ref >60)
Glucose, Bld: 159 mg/dL — ABNORMAL HIGH (ref 70–99)
Potassium: 4.8 mmol/L (ref 3.5–5.1)
Sodium: 138 mmol/L (ref 135–145)
Total Bilirubin: 0.2 mg/dL — ABNORMAL LOW (ref 0.3–1.2)
Total Protein: 6.8 g/dL (ref 6.5–8.1)

## 2022-09-28 LAB — CBC WITH DIFFERENTIAL/PLATELET
Abs Immature Granulocytes: 0.03 10*3/uL (ref 0.00–0.07)
Basophils Absolute: 0 10*3/uL (ref 0.0–0.1)
Basophils Relative: 1 %
Eosinophils Absolute: 0.2 10*3/uL (ref 0.0–0.5)
Eosinophils Relative: 3 %
HCT: 38.1 % (ref 36.0–46.0)
Hemoglobin: 11.8 g/dL — ABNORMAL LOW (ref 12.0–15.0)
Immature Granulocytes: 1 %
Lymphocytes Relative: 17 %
Lymphs Abs: 1.1 10*3/uL (ref 0.7–4.0)
MCH: 29.7 pg (ref 26.0–34.0)
MCHC: 31 g/dL (ref 30.0–36.0)
MCV: 96 fL (ref 80.0–100.0)
Monocytes Absolute: 0.5 10*3/uL (ref 0.1–1.0)
Monocytes Relative: 8 %
Neutro Abs: 4.5 10*3/uL (ref 1.7–7.7)
Neutrophils Relative %: 70 %
Platelets: 158 10*3/uL (ref 150–400)
RBC: 3.97 MIL/uL (ref 3.87–5.11)
RDW: 13.5 % (ref 11.5–15.5)
WBC: 6.3 10*3/uL (ref 4.0–10.5)
nRBC: 0 % (ref 0.0–0.2)

## 2022-09-28 MED ORDER — SODIUM CHLORIDE 0.9 % IV SOLN
Freq: Once | INTRAVENOUS | Status: AC
  Filled 2022-09-28: qty 250

## 2022-09-28 MED ORDER — SODIUM CHLORIDE 0.9 % IV SOLN
200.0000 mg | Freq: Once | INTRAVENOUS | Status: AC
  Administered 2022-09-28: 200 mg via INTRAVENOUS
  Filled 2022-09-28: qty 200

## 2022-09-28 NOTE — Assessment & Plan Note (Addendum)
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  Tolerating [name] fairly well-except for mild skin rash see below.  # proceed with [name] #11 today. CBC CMP are reviewed; adequate today; Proceed with treatment today.   DEC 2023- TSH-WNL.    # Skin rash-likely secondary to [name]--s/p kenalog ointment.  Also recommend antihistamine to Claritin.  Monitor closely after restarting [name].  # Intermittently- elevated calcium- 10.4; STOP vit D 50k; today calcium is 10.0.  Vit D levels- 69 [SEP 2023]- stable.    # Anemia: Hemoglobin around 11-12 overall stable/improving; on PO iron every other day. NOV 2023- Iron sat-35%; ferretin- 330- stable.     #CKD stage IV- GFR ~20; Continue enough hydration. Monitor closely. [s/p [facility] evaluation on June 1st, 2023]- stable. Awaiting nephrology evaluation with Dr.Singh re: weight gain   #Diabetes: Blood sugars stable. patient on metformin on glimepiride [as per nephrology] stable.   # IV ACCESS: PIV.    # DISPOSITION: # proceed with [place] today # follow up in 3 weeks/Tuesday or Wednesday - MD; labs- cbc/cmp;TSH-   [name]- Dr.B

## 2022-09-28 NOTE — Progress Notes (Signed)
[facility] NOTE  Patient Care Team: Tracie Harrier, MD as PCP - General (Internal Medicine) Cammie Sickle, MD as Consulting Physician (Oncology)  CHIEF COMPLAINTS/PURPOSE OF CONSULTATION: Kidney cancer   Oncology History Overview Note  UMOR  Tumor Focality: Unifocal  Tumor Size: Greatest dimension 9.3 cm  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  Histologic Grade (WHO / ISUP Grade): G3, nucleoli conspicuous and  eosinophilic at 875 X magnification  Tumor Extent: Extends into major vein (segmental branch adjacent to  renal sinus)  Sarcomatoid Features: Not identified  Rhabdoid Features: Not identified  Tumor Necrosis: Present, microscopic and macroscopic, 20%   MARGINS  Margin Status: All margins negative for invasive carcinoma   REGIONAL LYMPH NODES  Regional Lymph Node Status: Not applicable (no regional lymph nodes  submitted or found)   DISTANT METASTASIS  Distant Site(s) Involved, if applicable: Not applicable   PATHOLOGIC STAGE CLASSIFICATION (pTNM, AJCC 8th Edition):  TNM Descriptors: Applicable  IE3P  pN - Not assigned (no lymph nodes submitted or found)  pM - Not applicable   # IR5J- stage III; G-3; no sarcomatoid features [right nephrectomy- Dr.Sninksi].  # FEB 14th, 2023- CT AP [GI; [facility]]- . Large mass exophytic extending from lower to mid pole of the RIGHT kidney consistent with renal neoplasm. [Dr.Sninski; [facility]]  # FEB 2023- Right lower lobe subpleural 4 mm lung nodule; bone survey negative  #April 7th, 2023- On adjuvant [name]    # CKD III [ sec to HTN > 20 years; none now]; Anemia- EGD/colo- 2023- [[facility]]; Dr.Kowalski [No CAD]   Clear cell renal cell carcinoma, right ([place])  12/12/2021 Initial Diagnosis   Clear cell renal cell carcinoma, right ([place])   12/18/2021 - 04/05/2022 Chemotherapy   Patient is on Treatment Plan : RENAL CELL Pembrolizumab (200) q21d     12/18/2021 -  Chemotherapy   Patient is on  Treatment Plan : kidney adjuvant Pembrolizumab (200) q21d     01/08/2022 Cancer Staging   Staging form: Kidney, AJCC 8th Edition - Pathologic: Stage III (pT3a, pN0, cM0) - Signed by Cammie Sickle, MD on 01/08/2022    HISTORY OF PRESENTING ILLNESS: Ambulating independently.  Accompanied by husband.   Gabrielle Savage 77 y.o.  female with history of stage III-IV kidney cancer; CKD stage III currently on adjuvant Beryle Flock is here for follow-up.    Patient has no concern today. Rash/itching is improved. improvement with kenalog.   Patient was to have gained weight.  Complains of mild swelling of the legs.  Denies any worsening shortness of breath or cough.  Denies any worsening joint pains.  No nausea no vomiting.  Review of Systems  Constitutional:  Positive for malaise/fatigue. Negative for chills and fever.  HENT:  Negative for nosebleeds and sore throat.   Eyes:  Negative for double vision.  Respiratory:  Negative for cough, hemoptysis, sputum production and wheezing.   Cardiovascular:  Negative for chest pain, palpitations, orthopnea and leg swelling.  Gastrointestinal:  Negative for abdominal pain, blood in stool, constipation, diarrhea, heartburn, melena, nausea and vomiting.  Genitourinary:  Negative for dysuria, frequency and urgency.  Musculoskeletal:  Positive for back pain and joint pain.  Skin:  Positive for itching and rash.  Neurological:  Negative for dizziness, tingling, focal weakness, weakness and headaches.  Endo/Heme/Allergies:  Does not bruise/bleed easily.  Psychiatric/Behavioral:  Negative for depression. The patient is not nervous/anxious and does not have insomnia.      MEDICAL HISTORY:  Past Medical  History:  Diagnosis Date   Aortic atherosclerosis (HCC)    Arthritis    B12 deficiency    Bradycardia    CAD (coronary artery disease)    Carotid atherosclerosis    CKD (chronic kidney disease), stage III (HCC)    Diastolic dysfunction 73/41/9379    a.)  TTE 09/03/2021: EF 63%; no RWMAs; LA mildly enlarged; trivial TR/PR, mild AR, moderate MR; G1DD.   DOE (dyspnea on exertion)    Fatigue    Gout    HLD (hyperlipidemia)    Hypertension    IDA (iron deficiency anemia)    Iliotibial band syndrome, left leg    MDD (major depressive disorder)    OAB (overactive bladder)    Obesity    PAC (premature atrial contraction)    noted on Holter   PONV (postoperative nausea and vomiting)    a.) single episode in 03/2013   PVC (premature ventricular contraction)    noted on Holter   Right kidney mass 10/27/2021   a.) CT 10/27/2021 -- mass measuring 8.3 x 6.8 x 8.1 cm   Sensorineural hearing loss (SNHL) of both ears    SUI (stress urinary incontinence, female)    SVT (supraventricular tachycardia)    noted on Holter   T2DM (type 2 diabetes mellitus) ([place])    Uterovaginal prolapse    Vitamin D deficiency    Voiding dysfunction     SURGICAL HISTORY: Past Surgical History:  Procedure Laterality Date   ABDOMINAL HYSTERECTOMY     partial   BLADDER SUSPENSION     CHOLECYSTECTOMY     COLONOSCOPY N/A 07/25/2017   Procedure: COLONOSCOPY;  Surgeon: [name], MD;  Location[facility] ENDOSCOPY;  Service: Endoscopy;  Laterality: N/A;   COLONOSCOPY WITH PROPOFOL N/A 10/19/2021   Procedure: COLONOSCOPY WITH PROPOFOL;  Surgeon: [name], MD;  Location: [facility] ENDOSCOPY;  Service: Endoscopy;  Laterality: N/A;  DM   ESOPHAGOGASTRODUODENOSCOPY (EGD) WITH PROPOFOL N/A 10/19/2021   Procedure: ESOPHAGOGASTRODUODENOSCOPY (EGD) WITH PROPOFOL;  Surgeon: [name], MD;  Location: [facility] ENDOSCOPY;  Service: Endoscopy;  Laterality: N/A;   JOINT REPLACEMENT Bilateral    PARTIAL KNEE REPLACEMENT   KNEE SURGERY     LAPAROSCOPIC NEPHRECTOMY, HAND ASSISTED Right 11/20/2021   Procedure: HAND ASSISTED LAPAROSCOPIC RADICAL NEPHRECTOMY;  Surgeon: Billey Co, MD;  Location: [facility] ORS;  Service: Urology;  Laterality: Right;    PARATHYROIDECTOMY      SOCIAL HISTORY: Social History   Socioeconomic History   Marital status: Married    Spouse name: Not on file   Number of children: Not on file   Years of education: Not on file   Highest education level: Not on file  Occupational History   Not on file  Tobacco Use   Smoking status: Never    Passive exposure: Never   Smokeless tobacco: Never  Vaping Use   Vaping Use: Never used  Substance and Sexual Activity   Alcohol use: Not Currently    Comment: occ   Drug use: No   Sexual activity: Not Currently  Other Topics Concern   Not on file  Social History Narrative   Lives in [place] - close hospital; with husband; 3 biological children [[place]; [place]; [place]]. Never smoked; glass of wine rare. 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    Social Determinants of Health   Financial Resource Strain: Low Risk  (12/07/2021)   Overall Financial Resource Strain (CARDIA)    Difficulty of Paying Living Expenses: Not hard at all  Food Insecurity: No Food Insecurity (12/07/2021)   Hunger Vital Sign    Worried About Running Out of Food in the Last Year: Never true    Ran Out of Food in the Last Year: Never true  Transportation Needs: No Transportation Needs (12/07/2021)   PRAPARE - Hydrologist (Medical): No    Lack of Transportation (Non-Medical): No  Physical Activity: Insufficiently Active (12/07/2021)   Exercise Vital Sign    Days of Exercise per Week: 2 days    Minutes of Exercise per Session: 30 min  Stress: No Stress Concern Present (12/07/2021)   [place]    Feeling of Stress : Only a little  Social Connections: Moderately Integrated (12/07/2021)   Social Connection and Isolation Panel [NHANES]    Frequency of Communication with Friends and Family: Three times a week    Frequency of Social Gatherings with Friends and Family: Three times a week    Attends  Religious Services: 1 to 4 times per year    Active Member of Clubs or Organizations: No    Attends Archivist Meetings: Never    Marital Status: Married  Human resources officer Violence: Not At Risk (12/07/2021)   Humiliation, Afraid, Rape, and Kick questionnaire    Fear of Current or Ex-Partner: No    Emotionally Abused: No    Physically Abused: No    Sexually Abused: No    FAMILY HISTORY: Family History  Problem Relation Age of Onset   Heart failure Mother    Stroke Father    Lung cancer Father    Breast cancer Neg Hx     ALLERGIES:  is allergic to codeine, nsaids, and oxycodone.  MEDICATIONS:  Current Outpatient Medications  Medication Sig Dispense Refill   allopurinol (ZYLOPRIM) 100 MG tablet Take 300 mg by mouth every morning. Pt has decreased to '100mg'$      amLODipine (NORVASC) 5 MG tablet Take 5 mg by mouth every morning.     citalopram (CELEXA) 10 MG tablet Take 10 mg by mouth every morning.     ferrous sulfate 325 (65 FE) MG tablet Take 325 mg by mouth every other day.     irbesartan (AVAPRO) 150 MG tablet Take 150 mg by mouth every morning.     triamcinolone ointment (KENALOG) 0.5 % Apply 1 Application topically 2 (two) times daily. 30 g 1   vitamin B-12 (CYANOCOBALAMIN) 1000 MCG tablet Take 1,000 mcg by mouth daily.     docusate sodium (COLACE) 100 MG capsule Take 1 capsule (100 mg total) by mouth 2 (two) times daily. (Patient not taking: Reported on 07/06/2022) 20 capsule 0   No current facility-administered medications for this visit.      Marland Kitchen  PHYSICAL EXAMINATION:  Vitals:   09/28/22 1428  BP: (!) 130/52  Pulse: 64  Resp: 19  Temp: 98.6 F (37 C)  SpO2: 98%   Filed Weights   09/28/22 1428  Weight: 191 lb (86.6 kg)   Mild maculopapular rash noted in the back and also front.   Physical Exam Vitals and nursing note reviewed.  HENT:     Head: Normocephalic and atraumatic.     Mouth/Throat:     Pharynx: Oropharynx is clear.  Eyes:      Extraocular Movements: Extraocular movements intact.     Pupils: Pupils are equal, round, and reactive to light.  Cardiovascular:     Rate and Rhythm: Normal rate and regular  rhythm.  Pulmonary:     Comments: Decreased breath sounds bilaterally.  Abdominal:     Palpations: Abdomen is soft.  Musculoskeletal:        General: Normal range of motion.     Cervical back: Normal range of motion.  Skin:    General: Skin is warm.  Neurological:     General: No focal deficit present.     Mental Status: She is alert and oriented to person, place, and time.  Psychiatric:        Behavior: Behavior normal.        Judgment: Judgment normal.      LABORATORY DATA:  I have reviewed the data as listed Lab Results  Component Value Date   WBC 6.3 09/28/2022   HGB 11.8 (L) 09/28/2022   HCT 38.1 09/28/2022   MCV 96.0 09/28/2022   PLT 158 09/28/2022   Recent Labs    08/17/22 0941 09/07/22 1314 09/28/22 1408  NA 140 139 138  K 4.2 4.9 4.8  CL 112* 113* 108  CO2 20* 21* 21*  GLUCOSE 117* 133* 159*  BUN 60* 55* 49*  CREATININE 2.16* 2.30* 2.43*  CALCIUM 9.7 9.5 9.1  GFRNONAA 23* 22* 20*  PROT 6.8 6.8 6.8  ALBUMIN 3.9 4.0 3.9  AST 14* 13* 15  ALT '13 11 11  '$ ALKPHOS 90 88 101  BILITOT 0.3 0.4 0.2*    RADIOGRAPHIC STUDIES: I have personally reviewed the radiological images as listed and agreed with the findings in the report. No results found.  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  Tolerating [name] fairly well-except for mild skin rash see below.  # proceed with [name] #11 today. CBC CMP are reviewed; adequate today; Proceed with treatment today.   DEC 2023- TSH-WNL.    # Skin rash-likely secondary to [name]--s/p kenalog ointment.  Also recommend antihistamine to Claritin.  Monitor closely after restarting [name].  # Intermittently- elevated calcium- 10.4; STOP vit D 50k; today calcium is 10.0.  Vit D  levels- 69 [SEP 2023]- stable.    # Anemia: Hemoglobin around 11-12 overall stable/improving; on PO iron every other day. NOV 2023- Iron sat-35%; ferretin- 330- stable.     #CKD stage IV- GFR ~20; Continue enough hydration. Monitor closely. [s/p [facility] evaluation on June 1st, 2023]- stable. Awaiting nephrology evaluation with Dr.Singh re: weight gain   #Diabetes: Blood sugars stable. patient on metformin on glimepiride [as per nephrology] stable.   # IV ACCESS: PIV.    # DISPOSITION: # proceed with [place] today # follow up in 3 weeks/Tuesday or Wednesday - MD; labs- cbc/cmp;TSH-   [name]- Dr.B   All questions were answered. The patient knows to call the clinic with any problems, questions or concerns.     Cammie Sickle, MD 09/28/2022 6:24 PM

## 2022-09-28 NOTE — Progress Notes (Signed)
Patient has no concerns 

## 2022-09-28 NOTE — Patient Instructions (Signed)
[facility] AT [place]  Discharge Instructions: Thank you for choosing [facility] to provide your oncology and hematology care.  If you have a lab appointment with the [facility], please go directly to the [facility] and check in at the registration area.  Wear comfortable clothing and clothing appropriate for easy access to any Portacath or PICC line.   We strive to give you quality time with your provider. You may need to reschedule your appointment if you arrive late (15 or more minutes).  Arriving late affects you and other patients whose appointments are after yours.  Also, if you miss three or more appointments without notifying the office, you may be dismissed from the clinic at the provider's discretion.      For prescription refill requests, have your pharmacy contact our office and allow 72 hours for refills to be completed.    Today you received the following chemotherapy and/or immunotherapy agents- [name]      To help prevent nausea and vomiting after your treatment, we encourage you to take your nausea medication as directed.  BELOW ARE SYMPTOMS THAT SHOULD BE REPORTED IMMEDIATELY: *FEVER GREATER THAN 100.4 F (38 C) OR HIGHER *CHILLS OR SWEATING *NAUSEA AND VOMITING THAT IS NOT CONTROLLED WITH YOUR NAUSEA MEDICATION *UNUSUAL SHORTNESS OF BREATH *UNUSUAL BRUISING OR BLEEDING *URINARY PROBLEMS (pain or burning when urinating, or frequent urination) *BOWEL PROBLEMS (unusual diarrhea, constipation, pain near the anus) TENDERNESS IN MOUTH AND THROAT WITH OR WITHOUT PRESENCE OF ULCERS (sore throat, sores in mouth, or a toothache) UNUSUAL RASH, SWELLING OR PAIN  UNUSUAL VAGINAL DISCHARGE OR ITCHING   Items with * indicate a potential emergency and should be followed up as soon as possible or go to the Emergency Department if any problems should occur.  Please show the CHEMOTHERAPY ALERT CARD or IMMUNOTHERAPY ALERT CARD at check-in to  the Emergency Department and triage nurse.  Should you have questions after your visit or need to cancel or reschedule your appointment, please contact [facility] [place] [facility] [place]  [phone] and follow the prompts.  Office hours are 8:00 a.m. to 4:30 p.m. Monday - Friday. Please note that voicemails left after 4:00 p.m. may not be returned until the following business day.  We are closed weekends and major holidays. You have access to a nurse at all times for urgent questions. Please call the main number to the clinic [phone] and follow the prompts.  For any non-urgent questions, you may also contact your provider using MyChart. We now offer e-Visits for anyone 39 and older to request care online for non-urgent symptoms. For details visit [email or web address].   Also download the MyChart app! Go to the app store, search "MyChart", open the app, select [facility], and log in with your MyChart username and password.

## 2022-10-19 ENCOUNTER — Inpatient Hospital Stay: Payer: Medicare Other | Attending: Internal Medicine | Admitting: Internal Medicine

## 2022-10-19 ENCOUNTER — Encounter: Payer: Self-pay | Admitting: Internal Medicine

## 2022-10-19 ENCOUNTER — Inpatient Hospital Stay: Payer: Medicare Other

## 2022-10-19 VITALS — BP 145/65 | HR 60 | Temp 98.4°F | Resp 16 | Ht 63.0 in | Wt 191.3 lb

## 2022-10-19 DIAGNOSIS — C641 Malignant neoplasm of right kidney, except renal pelvis: Secondary | ICD-10-CM

## 2022-10-19 DIAGNOSIS — Z5112 Encounter for antineoplastic immunotherapy: Secondary | ICD-10-CM | POA: Insufficient documentation

## 2022-10-19 DIAGNOSIS — Z79620 Long term (current) use of immunosuppressive biologic: Secondary | ICD-10-CM | POA: Insufficient documentation

## 2022-10-19 DIAGNOSIS — R946 Abnormal results of thyroid function studies: Secondary | ICD-10-CM

## 2022-10-19 LAB — CBC WITH DIFFERENTIAL/PLATELET
Abs Immature Granulocytes: 0.03 10*3/uL (ref 0.00–0.07)
Basophils Absolute: 0.1 10*3/uL (ref 0.0–0.1)
Basophils Relative: 1 %
Eosinophils Absolute: 0.4 10*3/uL (ref 0.0–0.5)
Eosinophils Relative: 6 %
HCT: 37.9 % (ref 36.0–46.0)
Hemoglobin: 11.7 g/dL — ABNORMAL LOW (ref 12.0–15.0)
Immature Granulocytes: 0 %
Lymphocytes Relative: 28 %
Lymphs Abs: 1.9 10*3/uL (ref 0.7–4.0)
MCH: 29.9 pg (ref 26.0–34.0)
MCHC: 30.9 g/dL (ref 30.0–36.0)
MCV: 96.9 fL (ref 80.0–100.0)
Monocytes Absolute: 0.6 10*3/uL (ref 0.1–1.0)
Monocytes Relative: 10 %
Neutro Abs: 3.7 10*3/uL (ref 1.7–7.7)
Neutrophils Relative %: 55 %
Platelets: 152 10*3/uL (ref 150–400)
RBC: 3.91 MIL/uL (ref 3.87–5.11)
RDW: 13.7 % (ref 11.5–15.5)
WBC: 6.7 10*3/uL (ref 4.0–10.5)
nRBC: 0 % (ref 0.0–0.2)

## 2022-10-19 LAB — COMPREHENSIVE METABOLIC PANEL
ALT: 18 U/L (ref 0–44)
AST: 15 U/L (ref 15–41)
Albumin: 4.1 g/dL (ref 3.5–5.0)
Alkaline Phosphatase: 102 U/L (ref 38–126)
Anion gap: 9 (ref 5–15)
BUN: 59 mg/dL — ABNORMAL HIGH (ref 8–23)
CO2: 22 mmol/L (ref 22–32)
Calcium: 9.5 mg/dL (ref 8.9–10.3)
Chloride: 108 mmol/L (ref 98–111)
Creatinine, Ser: 2.46 mg/dL — ABNORMAL HIGH (ref 0.44–1.00)
GFR, Estimated: 20 mL/min — ABNORMAL LOW (ref >60)
Glucose, Bld: 122 mg/dL — ABNORMAL HIGH (ref 70–99)
Potassium: 4.6 mmol/L (ref 3.5–5.1)
Sodium: 139 mmol/L (ref 135–145)
Total Bilirubin: 0.4 mg/dL (ref 0.3–1.2)
Total Protein: 6.9 g/dL (ref 6.5–8.1)

## 2022-10-19 LAB — TSH: TSH: 3.87 u[IU]/mL (ref 0.350–4.500)

## 2022-10-19 MED ORDER — SODIUM CHLORIDE 0.9 % IV SOLN
200.0000 mg | Freq: Once | INTRAVENOUS | Status: AC
  Administered 2022-10-19: 200 mg via INTRAVENOUS
  Filled 2022-10-19: qty 8

## 2022-10-19 MED ORDER — SODIUM CHLORIDE 0.9 % IV SOLN
Freq: Once | INTRAVENOUS | Status: AC
  Filled 2022-10-19: qty 250

## 2022-10-19 NOTE — Progress Notes (Signed)
Patient denies new problems/concerns today.   

## 2022-10-19 NOTE — Assessment & Plan Note (Addendum)
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  Tolerating [name] fairly well-except for mild skin rash see below.  # proceed with [name] #13 today of planned 17. CBC CMP are reviewed; adequate today; Proceed with treatment today.  DEC 2023- TSH-WNL.    # Skin rash-likely secondary to [name]--s/p kenalog ointment.  Also recommend antihistamine to Claritin.  Monitor closely after restarting [name].stable.   # Intermittently- elevated calcium- 10.4; STOP vit D 50k; today calcium is 10.0.  Vit D levels- 69 [SEP 2023]- stable.    # Anemia: Hemoglobin around 11-12 overall stable/improving; on PO iron every other day. NOV 2023- Iron sat-35%; ferretin- 330- stable.     #CKD stage IV- GFR ~20; Continue enough hydration. Monitor closely. [s/p [facility] evaluation on June 1st, 2023]-stable.   #Diabetes: patient on metformin on glimepiride [as per nephrology]- 122-stable.   # IV ACCESS: PIV.    # DISPOSITION: # proceed with [place] today # follow up in 3 weeks/Tuesday or Wednesday - MD; labs- cbc/cmp;-   [name]- Dr.B

## 2022-10-19 NOTE — Progress Notes (Signed)
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  Histologic Grade (WHO / ISUP Grade): G3, nucleoli conspicuous and  eosinophilic at 008 X magnification  Tumor Extent: Extends into major vein (segmental branch adjacent to  renal sinus)  Sarcomatoid Features: Not identified  Rhabdoid Features: Not identified  Tumor Necrosis: Present, microscopic and macroscopic, 20%   MARGINS  Margin Status: All margins negative for invasive carcinoma   REGIONAL LYMPH NODES  Regional Lymph Node Status: Not applicable (no regional lymph nodes  submitted or found)   DISTANT METASTASIS  Distant Site(s) Involved, if applicable: Not applicable   PATHOLOGIC STAGE CLASSIFICATION (pTNM, AJCC 8th Edition):  TNM Descriptors: Applicable  QP6P  pN - Not assigned (no lymph nodes submitted or found)  pM - Not applicable   # PJ0D- stage III; G-3; no sarcomatoid features [right nephrectomy- Dr.Sninksi].  # FEB 14th, 2023- CT AP [GI; [facility]]- . Large mass exophytic extending from lower to mid pole of the RIGHT kidney consistent with renal neoplasm. [Dr.Sninski; [facility]]  # FEB 2023- Right lower lobe subpleural 4 mm lung nodule; bone survey negative  #April 7th, 2023- On adjuvant [name]    # CKD III [ sec to HTN > 20 years; none now]; Anemia- EGD/colo- 2023- [[facility]]; Dr.Kowalski [No CAD]   Clear cell renal cell carcinoma, right ([place])  12/12/2021 Initial Diagnosis   Clear cell renal cell carcinoma, right ([place])   12/18/2021 - 04/05/2022 Chemotherapy   Patient is on Treatment Plan : RENAL CELL Pembrolizumab (200) q21d     12/18/2021 -  Chemotherapy   Patient is on  Treatment Plan : kidney adjuvant Pembrolizumab (200) q21d     01/08/2022 Cancer Staging   Staging form: Kidney, AJCC 8th Edition - Pathologic: Stage III (pT3a, pN0, cM0) - Signed by Cammie Sickle, MD on 01/08/2022    HISTORY OF PRESENTING ILLNESS: Ambulating independently.  Accompanied by husband.   Gabrielle Savage 77 y.o.  female with history of stage III-IV kidney cancer; CKD stage III currently on adjuvant Beryle Flock is here for follow-up.    Patient denies new problems/concerns today. Rash/itching is improved. improvement with kenalog.   Patient was to have gained weight.  Complains of mild swelling of the legs. Denies any worsening shortness of breath or cough.  Denies any worsening joint pains.  No nausea no vomiting.  Review of Systems  Constitutional:  Positive for malaise/fatigue. Negative for chills and fever.  HENT:  Negative for nosebleeds and sore throat.   Eyes:  Negative for double vision.  Respiratory:  Negative for cough, hemoptysis, sputum production and wheezing.   Cardiovascular:  Negative for chest pain, palpitations, orthopnea and leg swelling.  Gastrointestinal:  Negative for abdominal pain, blood in stool, constipation, diarrhea, heartburn, melena, nausea and vomiting.  Genitourinary:  Negative for dysuria, frequency and urgency.  Musculoskeletal:  Positive for back pain and joint pain.  Skin:  Positive for itching and rash.  Neurological:  Negative for dizziness, tingling, focal weakness, weakness and headaches.  Endo/Heme/Allergies:  Does not bruise/bleed easily.  Psychiatric/Behavioral:  Negative for depression. The patient is not nervous/anxious and does not have insomnia.      MEDICAL HISTORY:  Past Medical History:  Diagnosis Date   Aortic atherosclerosis (HCC)    Arthritis    B12 deficiency    Bradycardia    CAD (coronary artery disease)    Carotid atherosclerosis    CKD (chronic kidney disease), stage III (HCC)    Diastolic dysfunction  74/94/4967   a.)  TTE 09/03/2021: EF 63%; no RWMAs; LA mildly enlarged; trivial TR/PR, mild AR, moderate MR; G1DD.   DOE (dyspnea on exertion)    Fatigue    Gout    HLD (hyperlipidemia)    Hypertension    IDA (iron deficiency anemia)    Iliotibial band syndrome, left leg    MDD (major depressive disorder)    OAB (overactive bladder)    Obesity    PAC (premature atrial contraction)    noted on Holter   PONV (postoperative nausea and vomiting)    a.) single episode in 03/2013   PVC (premature ventricular contraction)    noted on Holter   Right kidney mass 10/27/2021   a.) CT 10/27/2021 -- mass measuring 8.3 x 6.8 x 8.1 cm   Sensorineural hearing loss (SNHL) of both ears    SUI (stress urinary incontinence, female)    SVT (supraventricular tachycardia)    noted on Holter   T2DM (type 2 diabetes mellitus) ([place])    Uterovaginal prolapse    Vitamin D deficiency    Voiding dysfunction     SURGICAL HISTORY: Past Surgical History:  Procedure Laterality Date   ABDOMINAL HYSTERECTOMY     partial   BLADDER SUSPENSION     CHOLECYSTECTOMY     COLONOSCOPY N/A 07/25/2017   Procedure: COLONOSCOPY;  Surgeon: [name], MD;  Location[facility] ENDOSCOPY;  Service: Endoscopy;  Laterality: N/A;   COLONOSCOPY WITH PROPOFOL N/A 10/19/2021   Procedure: COLONOSCOPY WITH PROPOFOL;  Surgeon: [name], MD;  Location: [facility] ENDOSCOPY;  Service: Endoscopy;  Laterality: N/A;  DM   ESOPHAGOGASTRODUODENOSCOPY (EGD) WITH PROPOFOL N/A 10/19/2021   Procedure: ESOPHAGOGASTRODUODENOSCOPY (EGD) WITH PROPOFOL;  Surgeon: [name], MD;  Location: [facility] ENDOSCOPY;  Service: Endoscopy;  Laterality: N/A;   JOINT REPLACEMENT Bilateral    PARTIAL KNEE REPLACEMENT   KNEE SURGERY     LAPAROSCOPIC NEPHRECTOMY, HAND ASSISTED Right 11/20/2021   Procedure: HAND ASSISTED LAPAROSCOPIC RADICAL NEPHRECTOMY;  Surgeon: Billey Co, MD;  Location: [facility] ORS;  Service: Urology;  Laterality: Right;    PARATHYROIDECTOMY      SOCIAL HISTORY: Social History   Socioeconomic History   Marital status: Married    Spouse name: Not on file   Number of children: Not on file   Years of education: Not on file   Highest education level: Not on file  Occupational History   Not on file  Tobacco Use   Smoking status: Never    Passive exposure: Never   Smokeless tobacco: Never  Vaping Use   Vaping Use: Never used  Substance and Sexual Activity   Alcohol use: Not Currently    Comment: occ   Drug use: No   Sexual activity: Not Currently  Other Topics Concern   Not on file  Social History Narrative   Lives in [place] - close hospital; with husband; 3 biological children [[place]; [place]; [place]]. Never smoked; glass of wine rare. 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    Social Determinants of Health   Financial Resource Strain: Low Risk  (12/07/2021)   Overall Financial Resource Strain (CARDIA)    Difficulty of Paying Living Expenses: Not hard at all  Food  Insecurity: No Food Insecurity (12/07/2021)   Hunger Vital Sign    Worried About Running Out of Food in the Last Year: Never true    Ran Out of Food in the Last Year: Never true  Transportation Needs: No Transportation Needs (12/07/2021)   PRAPARE - Hydrologist (Medical): No    Lack of Transportation (Non-Medical): No  Physical Activity: Insufficiently Active (12/07/2021)   Exercise Vital Sign    Days of Exercise per Week: 2 days    Minutes of Exercise per Session: 30 min  Stress: No Stress Concern Present (12/07/2021)   [place]    Feeling of Stress : Only a little  Social Connections: Moderately Integrated (12/07/2021)   Social Connection and Isolation Panel [NHANES]    Frequency of Communication with Friends and Family: Three times a week    Frequency of Social Gatherings with Friends and Family: Three times a week    Attends  Religious Services: 1 to 4 times per year    Active Member of Clubs or Organizations: No    Attends Archivist Meetings: Never    Marital Status: Married  Human resources officer Violence: Not At Risk (12/07/2021)   Humiliation, Afraid, Rape, and Kick questionnaire    Fear of Current or Ex-Partner: No    Emotionally Abused: No    Physically Abused: No    Sexually Abused: No    FAMILY HISTORY: Family History  Problem Relation Age of Onset   Heart failure Mother    Stroke Father    Lung cancer Father    Breast cancer Neg Hx     ALLERGIES:  is allergic to codeine, nsaids, and oxycodone.  MEDICATIONS:  Current Outpatient Medications  Medication Sig Dispense Refill   allopurinol (ZYLOPRIM) 100 MG tablet Take 300 mg by mouth every morning. Pt has decreased to '100mg'$      amLODipine (NORVASC) 5 MG tablet Take 5 mg by mouth every morning.     citalopram (CELEXA) 10 MG tablet Take 10 mg by mouth every morning.     docusate sodium (COLACE) 100 MG capsule Take 1 capsule (100 mg total) by mouth 2 (two) times daily. (Patient not taking: Reported on 07/06/2022) 20 capsule 0   ferrous sulfate 325 (65 FE) MG tablet Take 325 mg by mouth every other day.     irbesartan (AVAPRO) 150 MG tablet Take 150 mg by mouth every morning.     triamcinolone ointment (KENALOG) 0.5 % Apply 1 Application topically 2 (two) times daily. 30 g 1   vitamin B-12 (CYANOCOBALAMIN) 1000 MCG tablet Take 1,000 mcg by mouth daily.     No current facility-administered medications for this visit.      Marland Kitchen  PHYSICAL EXAMINATION:  There were no vitals filed for this visit.  There were no vitals filed for this visit.  Mild maculopapular rash noted in the back and also front.   Physical Exam Vitals and nursing note reviewed.  HENT:     Head: Normocephalic and atraumatic.     Mouth/Throat:     Pharynx: Oropharynx is clear.  Eyes:     Extraocular Movements: Extraocular movements intact.     Pupils: Pupils are  equal, round, and reactive to light.  Cardiovascular:     Rate and Rhythm: Normal rate and regular rhythm.  Pulmonary:     Comments: Decreased breath sounds bilaterally.  Abdominal:     Palpations: Abdomen is soft.  Musculoskeletal:        General: Normal range of motion.     Cervical back: Normal range of motion.  Skin:    General: Skin is warm.  Neurological:     General: No focal deficit present.     Mental Status: She is alert and oriented to person, place, and time.  Psychiatric:        Behavior: Behavior normal.        Judgment: Judgment normal.     LABORATORY DATA:  I have reviewed the data as listed Lab Results  Component Value Date   WBC 6.3 09/28/2022   HGB 11.8 (L) 09/28/2022   HCT 38.1 09/28/2022   MCV 96.0 09/28/2022   PLT 158 09/28/2022   Recent Labs    08/17/22 0941 09/07/22 1314 09/28/22 1408  NA 140 139 138  K 4.2 4.9 4.8  CL 112* 113* 108  CO2 20* 21* 21*  GLUCOSE 117* 133* 159*  BUN 60* 55* 49*  CREATININE 2.16* 2.30* 2.43*  CALCIUM 9.7 9.5 9.1  GFRNONAA 23* 22* 20*  PROT 6.8 6.8 6.8  ALBUMIN 3.9 4.0 3.9  AST 14* 13* 15  ALT '13 11 11  '$ ALKPHOS 90 88 101  BILITOT 0.3 0.4 0.2*     RADIOGRAPHIC STUDIES: I have personally reviewed the radiological images as listed and agreed with the findings in the report. No results found.  No problem-specific Assessment & Plan notes found for this encounter.  All questions were answered. The patient knows to call the clinic with any problems, questions or concerns.     Cammie Sickle, MD 10/19/2022 8:50 AM

## 2022-10-19 NOTE — Patient Instructions (Signed)
[facility]  Discharge Instructions: Thank you for choosing [facility] to provide your oncology and hematology care.  If you have a lab appointment with the [facility], please go directly to the [facility] and check in at the registration area.  Wear comfortable clothing and clothing appropriate for easy access to any Portacath or PICC line.   We strive to give you quality time with your provider. You may need to reschedule your appointment if you arrive late (15 or more minutes).  Arriving late affects you and other patients whose appointments are after yours.  Also, if you miss three or more appointments without notifying the office, you may be dismissed from the clinic at the provider's discretion.      For prescription refill requests, have your pharmacy contact our office and allow 72 hours for refills to be completed.    To help prevent nausea and vomiting after your treatment, we encourage you to take your nausea medication as directed.  BELOW ARE SYMPTOMS THAT SHOULD BE REPORTED IMMEDIATELY: *FEVER GREATER THAN 100.4 F (38 C) OR HIGHER *CHILLS OR SWEATING *NAUSEA AND VOMITING THAT IS NOT CONTROLLED WITH YOUR NAUSEA MEDICATION *UNUSUAL SHORTNESS OF BREATH *UNUSUAL BRUISING OR BLEEDING *URINARY PROBLEMS (pain or burning when urinating, or frequent urination) *BOWEL PROBLEMS (unusual diarrhea, constipation, pain near the anus) TENDERNESS IN MOUTH AND THROAT WITH OR WITHOUT PRESENCE OF ULCERS (sore throat, sores in mouth, or a toothache) UNUSUAL RASH, SWELLING OR PAIN  UNUSUAL VAGINAL DISCHARGE OR ITCHING   Items with * indicate a potential emergency and should be followed up as soon as possible or go to the Emergency Department if any problems should occur.  Please show the CHEMOTHERAPY ALERT CARD or IMMUNOTHERAPY ALERT CARD at check-in to the Emergency Department and triage nurse.  Should you have questions after your visit or  need to cancel or reschedule your appointment, please contact [facility]  [phone] and follow the prompts.  Office hours are 8:00 a.m. to 4:30 p.m. Monday - Friday. Please note that voicemails left after 4:00 p.m. may not be returned until the following business day.  We are closed weekends and major holidays. You have access to a nurse at all times for urgent questions. Please call the main number to the clinic [phone] and follow the prompts.  For any non-urgent questions, you may also contact your provider using MyChart. We now offer e-Visits for anyone 66 and older to request care online for non-urgent symptoms. For details visit [email or web address].   Also download the MyChart app! Go to the app store, search "MyChart", open the app, select [facility], and log in with your MyChart username and password.

## 2022-10-28 DIAGNOSIS — Z905 Acquired absence of kidney: Secondary | ICD-10-CM | POA: Insufficient documentation

## 2022-11-09 ENCOUNTER — Inpatient Hospital Stay (HOSPITAL_BASED_OUTPATIENT_CLINIC_OR_DEPARTMENT_OTHER): Payer: Medicare Other | Admitting: Internal Medicine

## 2022-11-09 ENCOUNTER — Inpatient Hospital Stay: Payer: Medicare Other

## 2022-11-09 ENCOUNTER — Encounter: Payer: Self-pay | Admitting: Internal Medicine

## 2022-11-09 VITALS — BP 137/61 | HR 67 | Temp 98.1°F | Resp 18 | Wt 193.6 lb

## 2022-11-09 DIAGNOSIS — C641 Malignant neoplasm of right kidney, except renal pelvis: Secondary | ICD-10-CM

## 2022-11-09 DIAGNOSIS — Z5112 Encounter for antineoplastic immunotherapy: Secondary | ICD-10-CM | POA: Diagnosis not present

## 2022-11-09 LAB — CBC WITH DIFFERENTIAL/PLATELET
Abs Immature Granulocytes: 0.03 10*3/uL (ref 0.00–0.07)
Basophils Absolute: 0 10*3/uL (ref 0.0–0.1)
Basophils Relative: 1 %
Eosinophils Absolute: 0.3 10*3/uL (ref 0.0–0.5)
Eosinophils Relative: 4 %
HCT: 38.8 % (ref 36.0–46.0)
Hemoglobin: 12.1 g/dL (ref 12.0–15.0)
Immature Granulocytes: 0 %
Lymphocytes Relative: 23 %
Lymphs Abs: 1.5 10*3/uL (ref 0.7–4.0)
MCH: 30.1 pg (ref 26.0–34.0)
MCHC: 31.2 g/dL (ref 30.0–36.0)
MCV: 96.5 fL (ref 80.0–100.0)
Monocytes Absolute: 0.8 10*3/uL (ref 0.1–1.0)
Monocytes Relative: 11 %
Neutro Abs: 4.1 10*3/uL (ref 1.7–7.7)
Neutrophils Relative %: 61 %
Platelets: 178 10*3/uL (ref 150–400)
RBC: 4.02 MIL/uL (ref 3.87–5.11)
RDW: 14.1 % (ref 11.5–15.5)
WBC: 6.8 10*3/uL (ref 4.0–10.5)
nRBC: 0 % (ref 0.0–0.2)

## 2022-11-09 LAB — COMPREHENSIVE METABOLIC PANEL
ALT: 12 U/L (ref 0–44)
AST: 13 U/L — ABNORMAL LOW (ref 15–41)
Albumin: 4.2 g/dL (ref 3.5–5.0)
Alkaline Phosphatase: 116 U/L (ref 38–126)
Anion gap: 9 (ref 5–15)
BUN: 57 mg/dL — ABNORMAL HIGH (ref 8–23)
CO2: 21 mmol/L — ABNORMAL LOW (ref 22–32)
Calcium: 9.7 mg/dL (ref 8.9–10.3)
Chloride: 107 mmol/L (ref 98–111)
Creatinine, Ser: 2.2 mg/dL — ABNORMAL HIGH (ref 0.44–1.00)
GFR, Estimated: 23 mL/min — ABNORMAL LOW (ref >60)
Glucose, Bld: 124 mg/dL — ABNORMAL HIGH (ref 70–99)
Potassium: 4.2 mmol/L (ref 3.5–5.1)
Sodium: 137 mmol/L (ref 135–145)
Total Bilirubin: 0.5 mg/dL (ref 0.3–1.2)
Total Protein: 7.3 g/dL (ref 6.5–8.1)

## 2022-11-09 LAB — TSH: TSH: 3.02 u[IU]/mL (ref 0.350–4.500)

## 2022-11-09 MED ORDER — SODIUM CHLORIDE 0.9 % IV SOLN
200.0000 mg | Freq: Once | INTRAVENOUS | Status: AC
  Administered 2022-11-09: 200 mg via INTRAVENOUS
  Filled 2022-11-09: qty 8

## 2022-11-09 MED ORDER — TRIAMCINOLONE ACETONIDE 0.5 % EX OINT: 1.0000 | TOPICAL_OINTMENT | Freq: Two times a day (BID) | CUTANEOUS | 1 refills | Status: DC

## 2022-11-09 MED ORDER — SODIUM CHLORIDE 0.9 % IV SOLN
Freq: Once | INTRAVENOUS | Status: AC
  Filled 2022-11-09: qty 250

## 2022-11-09 NOTE — Progress Notes (Signed)
Has Fatigue. Complains of itching, could be dry skin not sure. Appetite is great. No blood in urine.

## 2022-11-09 NOTE — Progress Notes (Signed)
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  Histologic Grade (WHO / ISUP Grade): G3, nucleoli conspicuous and  eosinophilic at 123XX123 X magnification  Tumor Extent: Extends into major vein (segmental branch adjacent to  renal sinus)  Sarcomatoid Features: Not identified  Rhabdoid Features: Not identified  Tumor Necrosis: Present, microscopic and macroscopic, 20%   MARGINS  Margin Status: All margins negative for invasive carcinoma   REGIONAL LYMPH NODES  Regional Lymph Node Status: Not applicable (no regional lymph nodes  submitted or found)   DISTANT METASTASIS  Distant Site(s) Involved, if applicable: Not applicable   PATHOLOGIC STAGE CLASSIFICATION (pTNM, AJCC 8th Edition):  TNM Descriptors: Applicable  [id number]  pN - Not assigned (no lymph nodes submitted or found)  pM - Not applicable   # 123XX123- stage III; G-3; no sarcomatoid features [right nephrectomy- Dr.Sninksi].  # FEB 14th, 2023- CT AP [Gabrielle Savage; Gabrielle Savage]- . Large mass exophytic extending from lower to mid pole of the RIGHT kidney consistent with renal neoplasm. [Gabrielle Savage; Gabrielle Savage]  # FEB 2023- Right lower lobe subpleural 4 mm lung nodule; bone survey negative  #April 7th, 2023- On adjuvant [name]    # CKD III [ sec to HTN > 20 years; none now]; Anemia- EGD/colo- 2023- [Gabrielle Savage]; Dr.Kowalski [No CAD]   Clear cell carcinoma of kidney, right (Gabrielle Savage)  12/12/2021 Initial Diagnosis   Clear cell renal cell carcinoma, right (Gabrielle Savage)   12/18/2021 - 04/05/2022 Chemotherapy   Patient is on Treatment Plan : RENAL CELL Pembrolizumab (200) q21d     12/18/2021 -  Chemotherapy   Patient is on Treatment  Plan : kidney adjuvant Pembrolizumab (200) q21d     01/08/2022 Cancer Staging   Staging form: Kidney, AJCC 8th Edition - Pathologic: Stage III (pT3a, pN0, cM0) - Signed by Gabrielle Sickle, MD on 01/08/2022    HISTORY OF PRESENTING ILLNESS: Ambulating independently.  Alone.   Gabrielle Savage 77 y.o.  female with history of stage III-IV kidney cancer; CKD stage III currently on adjuvant Beryle Flock is here for follow-up.    Has Fatigue. Complains of itching, could be dry skin not sure. improvement with kenalog.   Appetite is great. No blood in urine.   Patient was to have gained weight.  Complains of mild swelling of the legs. Denies any worsening shortness of breath or cough.  Denies any worsening joint pains.  No nausea no vomiting.  Review of Systems  Constitutional:  Positive for malaise/fatigue. Negative for chills and fever.  HENT:  Negative for nosebleeds and sore throat.   Eyes:  Negative for double vision.  Respiratory:  Negative for cough, hemoptysis, sputum production and wheezing.   Cardiovascular:  Negative for chest pain, palpitations, orthopnea and leg swelling.  Gastrointestinal:  Negative for abdominal pain, blood in stool, constipation, diarrhea, heartburn, melena, nausea and vomiting.  Genitourinary:  Negative for dysuria, frequency and urgency.  Musculoskeletal:  Positive for back pain and joint pain.  Skin:  Positive for itching and rash.  Neurological:  Negative for dizziness, tingling, focal weakness, weakness and headaches.  Endo/Heme/Allergies:  Does not bruise/bleed easily.  Psychiatric/Behavioral:  Negative for depression. The patient is not nervous/anxious and does not have insomnia.  MEDICAL HISTORY:  Past Medical History:  Diagnosis Date   Aortic atherosclerosis (HCC)    Arthritis    B12 deficiency    Bradycardia    CAD (coronary artery disease)    Carotid atherosclerosis    CKD (chronic kidney disease), stage III (HCC)    Diastolic  dysfunction [id number]   a.)  TTE 09/03/2021: EF 63%; no RWMAs; LA mildly enlarged; trivial TR/PR, mild AR, moderate MR; G1DD.   DOE (dyspnea on exertion)    Fatigue    Gout    HLD (hyperlipidemia)    Hypertension    IDA (iron deficiency anemia)    Iliotibial band syndrome, left leg    MDD (major depressive disorder)    OAB (overactive bladder)    Obesity    PAC (premature atrial contraction)    noted on Holter   PONV (postoperative nausea and vomiting)    a.) single episode in 03/2013   PVC (premature ventricular contraction)    noted on Holter   Right kidney mass 10/27/2021   a.) CT 10/27/2021 -- mass measuring 8.3 x 6.8 x 8.1 cm   Sensorineural hearing loss (SNHL) of both ears    SUI (stress urinary incontinence, female)    SVT (supraventricular tachycardia)    noted on Holter   T2DM (type 2 diabetes mellitus) ([place])    Uterovaginal prolapse    Vitamin D deficiency    Voiding dysfunction     SURGICAL HISTORY: Past Surgical History:  Procedure Laterality Date   ABDOMINAL HYSTERECTOMY     partial   BLADDER SUSPENSION     CHOLECYSTECTOMY     COLONOSCOPY N/A 07/25/2017   Procedure: COLONOSCOPY;  Surgeon: [name], MD;  Location: [facility] ENDOSCOPY;  Service: Endoscopy;  Laterality: N/A;   COLONOSCOPY WITH PROPOFOL N/A 10/19/2021   Procedure: COLONOSCOPY WITH PROPOFOL;  Surgeon: [name], MD;  Location: [facility] ENDOSCOPY;  Service: Endoscopy;  Laterality: N/A;  DM   ESOPHAGOGASTRODUODENOSCOPY (EGD) WITH PROPOFOL N/A 10/19/2021   Procedure: ESOPHAGOGASTRODUODENOSCOPY (EGD) WITH PROPOFOL;  Surgeon: [name], MD;  Location: [facility] ENDOSCOPY;  Service: Endoscopy;  Laterality: N/A;   JOINT REPLACEMENT Bilateral    PARTIAL KNEE REPLACEMENT   KNEE SURGERY     LAPAROSCOPIC NEPHRECTOMY, HAND ASSISTED Right 11/20/2021   Procedure: HAND ASSISTED LAPAROSCOPIC RADICAL NEPHRECTOMY;  Surgeon: Billey Co, MD;  Location: [facility] ORS;  Service: Urology;  Laterality:  Right;   PARATHYROIDECTOMY      SOCIAL HISTORY: Social History   Socioeconomic History   Marital status: Married    Spouse name: Not on file   Number of children: Not on file   Years of education: Not on file   Highest education level: Not on file  Occupational History   Not on file  Tobacco Use   Smoking status: Never    Passive exposure: Never   Smokeless tobacco: Never  Vaping Use   Vaping Use: Never used  Substance and Sexual Activity   Alcohol use: Not Currently    Comment: occ   Drug use: No   Sexual activity: Not Currently  Other Topics Concern   Not on file  Social History Narrative   Lives in [place] - close hospital; with husband; 3 biological children [[place]; [place]; [place]]. Never smoked; glass of wine rare. 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    Social Determinants of Health   Financial Resource Strain: Low Risk  (12/07/2021)   Overall Financial Resource Strain (CARDIA)    Difficulty of Paying Living  Expenses: Not hard at all  Food Insecurity: No Food Insecurity (12/07/2021)   Hunger Vital Sign    Worried About Running Out of Food in the Last Year: Never true    Ran Out of Food in the Last Year: Never true  Transportation Needs: No Transportation Needs (12/07/2021)   PRAPARE - Hydrologist (Medical): No    Lack of Transportation (Non-Medical): No  Physical Activity: Insufficiently Active (12/07/2021)   Exercise Vital Sign    Days of Exercise per Week: 2 days    Minutes of Exercise per Session: 30 min  Stress: No Stress Concern Present (12/07/2021)   [place]    Feeling of Stress : Only a little  Social Connections: Moderately Integrated (12/07/2021)   Social Connection and Isolation Panel [NHANES]    Frequency of Communication with Friends and Family: Three times a week    Frequency of Social Gatherings with Friends and Family: Three times a week     Attends Religious Services: 1 to 4 times per year    Active Member of Clubs or Organizations: No    Attends Archivist Meetings: Never    Marital Status: Married  Human resources officer Violence: Not At Risk (12/07/2021)   Humiliation, Afraid, Rape, and Kick questionnaire    Fear of Current or Ex-Partner: No    Emotionally Abused: No    Physically Abused: No    Sexually Abused: No    FAMILY HISTORY: Family History  Problem Relation Age of Onset   Heart failure Mother    Stroke Father    Lung cancer Father    Breast cancer Neg Hx     ALLERGIES:  is allergic to codeine, nsaids, and oxycodone.  MEDICATIONS:  Current Outpatient Medications  Medication Sig Dispense Refill   allopurinol (ZYLOPRIM) 100 MG tablet Take 300 mg by mouth every morning. Pt has decreased to '100mg'$      amLODipine (NORVASC) 5 MG tablet Take 5 mg by mouth every morning.     citalopram (CELEXA) 10 MG tablet Take 10 mg by mouth every morning.     ferrous sulfate 325 (65 FE) MG tablet Take 325 mg by mouth every other day.     irbesartan (AVAPRO) 150 MG tablet Take 150 mg by mouth every morning.     vitamin B-12 (CYANOCOBALAMIN) 1000 MCG tablet Take 1,000 mcg by mouth daily.     docusate sodium (COLACE) 100 MG capsule Take 1 capsule (100 mg total) by mouth 2 (two) times daily. (Patient not taking: Reported on 07/06/2022) 20 capsule 0   triamcinolone ointment (KENALOG) 0.5 % Apply 1 Application topically 2 (two) times daily. 30 g 1   No current facility-administered medications for this visit.      Marland Kitchen  PHYSICAL EXAMINATION:  Vitals:   11/09/22 0957  BP: 137/61  Pulse: 67  Resp: 18  Temp: 98.1 F (36.7 C)   Filed Weights   11/09/22 0957  Weight: 193 lb 9.6 oz (87.8 kg)   Mild maculopapular rash noted in the back and also front.   Physical Exam Vitals and nursing note reviewed.  HENT:     Head: Normocephalic and atraumatic.     Mouth/Throat:     Pharynx: Oropharynx is clear.  Eyes:      Extraocular Movements: Extraocular movements intact.     Pupils: Pupils are equal, round, and reactive to light.  Cardiovascular:     Rate and Rhythm:  Normal rate and regular rhythm.  Pulmonary:     Comments: Decreased breath sounds bilaterally.  Abdominal:     Palpations: Abdomen is soft.  Musculoskeletal:        General: Normal range of motion.     Cervical back: Normal range of motion.  Skin:    General: Skin is warm.  Neurological:     General: No focal deficit present.     Mental Status: She is alert and oriented to person, place, and time.  Psychiatric:        Behavior: Behavior normal.        Judgment: Judgment normal.      LABORATORY DATA:  I have reviewed the data as listed Lab Results  Component Value Date   WBC 6.8 11/09/2022   HGB 12.1 11/09/2022   HCT 38.8 11/09/2022   MCV 96.5 11/09/2022   PLT 178 11/09/2022   Recent Labs    09/28/22 1408 10/19/22 0910 11/09/22 0943  NA 138 139 137  K 4.8 4.6 4.2  CL 108 108 107  CO2 21* 22 21*  GLUCOSE 159* 122* 124*  BUN 49* 59* 57*  CREATININE 2.43* 2.46* 2.20*  CALCIUM 9.1 9.5 9.7  GFRNONAA 20* 20* 23*  PROT 6.8 6.9 7.3  ALBUMIN 3.9 4.1 4.2  AST 15 15 13*  ALT '11 18 12  '$ ALKPHOS 101 102 116  BILITOT 0.2* 0.4 0.5    RADIOGRAPHIC STUDIES: I have personally reviewed the radiological images as listed and agreed with the findings in the report. No results found.  Clear cell carcinoma of kidney, right ([place]) #Stage IIIa-clear-cell renal carcinoma-on adjuvant [name].  Tolerating [name] fairly well-except for mild skin rash see below.  # proceed with [name] #14 today of planned 17. CBC CMP are reviewed; adequate today; Proceed with treatment today.  DEC 2023- TSH-WNL.  stable  # Skin rash-likely secondary to [name]--s/p kenalog ointment.  Also recommend antihistamine to Claritin.  Monitor closely after restarting [name].stable. refilled today.   # Intermittently- elevated calcium- 10.4; STOP vit D  50k; today calcium is 10.0.  Vit D levels- 69 [SEP 2023]- stable.    # Anemia: Hemoglobin around 11-12 overall stable/improving; on PO iron every other day. NOV 2023- Iron sat-35%; ferretin- 330- stable.     #CKD stage IV- GFR ~20; Continue enough hydration. Monitor closely. [s/p [facility] evaluation on June 1st, 2023]-stable.   #Diabetes: patient on metformin on glimepiride [as per nephrology]--stable. .   # IV ACCESS: PIV.    # DISPOSITION: # proceed with [place] today # follow up in 3 weeks/Tuesday or Wednesday - MD; labs- cbc/cmp;-   Beryle Flock- Dr.B  All questions were answered. The patient knows to call the clinic with any problems, questions or concerns.   Gabrielle Sickle, MD 11/09/2022 10:28 AM

## 2022-11-09 NOTE — Patient Instructions (Signed)
[facility] [place] [facility]  Discharge Instructions: Thank you for choosing [facility] to provide your oncology and hematology care.  If you have a lab appointment with the Cancer Center, please go directly to the Cancer Center and check in at the registration area.  Wear comfortable clothing and clothing appropriate for easy access to any Portacath or PICC line.   We strive to give you quality time with your provider. You may need to reschedule your appointment if you arrive late (15 or more minutes).  Arriving late affects you and other patients whose appointments are after yours.  Also, if you miss three or more appointments without notifying the office, you may be dismissed from the clinic at the provider's discretion.      For prescription refill requests, have your pharmacy contact our office and allow 72 hours for refills to be completed.    Today you received the following chemotherapy and/or immunotherapy agents- [name]      To help prevent nausea and vomiting after your treatment, we encourage you to take your nausea medication as directed.  BELOW ARE SYMPTOMS THAT SHOULD BE REPORTED IMMEDIATELY: *FEVER GREATER THAN 100.4 F (38 C) OR HIGHER *CHILLS OR SWEATING *NAUSEA AND VOMITING THAT IS NOT CONTROLLED WITH YOUR NAUSEA MEDICATION *UNUSUAL SHORTNESS OF BREATH *UNUSUAL BRUISING OR BLEEDING *URINARY PROBLEMS (pain or burning when urinating, or frequent urination) *BOWEL PROBLEMS (unusual diarrhea, constipation, pain near the anus) TENDERNESS IN MOUTH AND THROAT WITH OR WITHOUT PRESENCE OF ULCERS (sore throat, sores in mouth, or a toothache) UNUSUAL RASH, SWELLING OR PAIN  UNUSUAL VAGINAL DISCHARGE OR ITCHING   Items with * indicate a potential emergency and should be followed up as soon as possible or go to the Emergency Department if any problems should occur.  Please show the CHEMOTHERAPY ALERT CARD or IMMUNOTHERAPY ALERT CARD at check-in to  the Emergency Department and triage nurse.  Should you have questions after your visit or need to cancel or reschedule your appointment, please contact [facility] [place] [facility]  [phone] and follow the prompts.  Office hours are 8:00 a.m. to 4:30 p.m. Monday - Friday. Please note that voicemails left after 4:00 p.m. may not be returned until the following business day.  We are closed weekends and major holidays. You have access to a nurse at all times for urgent questions. Please call the main number to the clinic [phone] and follow the prompts.  For any non-urgent questions, you may also contact your provider using MyChart. We now offer e-Visits for anyone 18 and older to request care online for non-urgent symptoms. For details visit [email or web address].   Also download the MyChart app! Go to the app store, search "MyChart", open the app, select [place], and log in with your MyChart username and password.   

## 2022-11-09 NOTE — Assessment & Plan Note (Addendum)
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  Tolerating [name] fairly well-except for mild skin rash see below.  # proceed with [name] #14 today of planned 17. CBC CMP are reviewed; adequate today; Proceed with treatment today.  DEC 2023- TSH-WNL.  stable  # Skin rash-likely secondary to [name]--s/p kenalog ointment.  Also recommend antihistamine to Claritin.  Monitor closely after restarting [name].stable. refilled today.   # Intermittently- elevated calcium- 10.4; STOP vit D 50k; today calcium is 10.0.  Vit D levels- 69 [SEP 2023]- stable.    # Anemia: Hemoglobin around 11-12 overall stable/improving; on PO iron every other day. NOV 2023- Iron sat-35%; ferretin- 330- stable.     #CKD stage IV- GFR ~20; Continue enough hydration. Monitor closely. [s/p [facility] evaluation on June 1st, 2023]-stable.   #Diabetes: patient on metformin on glimepiride [as per nephrology]--stable. .   # IV ACCESS: PIV.    # DISPOSITION: # proceed with [place] today # follow up in 3 weeks/Tuesday or Wednesday - MD; labs- cbc/cmp;-   [name]- Dr.B

## 2022-11-11 LAB — T4: T4, Total: 5.8 ug/dL (ref 4.5–12.0)

## 2022-11-30 ENCOUNTER — Inpatient Hospital Stay: Payer: Medicare Other | Attending: Internal Medicine

## 2022-11-30 ENCOUNTER — Inpatient Hospital Stay (HOSPITAL_BASED_OUTPATIENT_CLINIC_OR_DEPARTMENT_OTHER): Payer: Medicare Other | Admitting: Internal Medicine

## 2022-11-30 ENCOUNTER — Inpatient Hospital Stay: Payer: Medicare Other

## 2022-11-30 ENCOUNTER — Encounter: Payer: Self-pay | Admitting: Internal Medicine

## 2022-11-30 VITALS — BP 149/57 | HR 66 | Temp 99.0°F | Resp 17 | Wt 198.6 lb

## 2022-11-30 DIAGNOSIS — C641 Malignant neoplasm of right kidney, except renal pelvis: Secondary | ICD-10-CM

## 2022-11-30 DIAGNOSIS — R946 Abnormal results of thyroid function studies: Secondary | ICD-10-CM | POA: Diagnosis not present

## 2022-11-30 DIAGNOSIS — Z5112 Encounter for antineoplastic immunotherapy: Secondary | ICD-10-CM | POA: Insufficient documentation

## 2022-11-30 LAB — COMPREHENSIVE METABOLIC PANEL
ALT: 12 U/L (ref 0–44)
AST: 16 U/L (ref 15–41)
Albumin: 4.2 g/dL (ref 3.5–5.0)
Alkaline Phosphatase: 115 U/L (ref 38–126)
Anion gap: 6 (ref 5–15)
BUN: 44 mg/dL — ABNORMAL HIGH (ref 8–23)
CO2: 23 mmol/L (ref 22–32)
Calcium: 9.1 mg/dL (ref 8.9–10.3)
Chloride: 108 mmol/L (ref 98–111)
Creatinine, Ser: 2.25 mg/dL — ABNORMAL HIGH (ref 0.44–1.00)
GFR, Estimated: 22 mL/min — ABNORMAL LOW (ref >60)
Glucose, Bld: 105 mg/dL — ABNORMAL HIGH (ref 70–99)
Potassium: 4.4 mmol/L (ref 3.5–5.1)
Sodium: 137 mmol/L (ref 135–145)
Total Bilirubin: 0.4 mg/dL (ref 0.3–1.2)
Total Protein: 6.9 g/dL (ref 6.5–8.1)

## 2022-11-30 LAB — CBC WITH DIFFERENTIAL/PLATELET
Abs Immature Granulocytes: 0.03 10*3/uL (ref 0.00–0.07)
Basophils Absolute: 0 10*3/uL (ref 0.0–0.1)
Basophils Relative: 0 %
Eosinophils Absolute: 0.2 10*3/uL (ref 0.0–0.5)
Eosinophils Relative: 3 %
HCT: 37.2 % (ref 36.0–46.0)
Hemoglobin: 11.7 g/dL — ABNORMAL LOW (ref 12.0–15.0)
Immature Granulocytes: 0 %
Lymphocytes Relative: 19 %
Lymphs Abs: 1.3 10*3/uL (ref 0.7–4.0)
MCH: 30 pg (ref 26.0–34.0)
MCHC: 31.5 g/dL (ref 30.0–36.0)
MCV: 95.4 fL (ref 80.0–100.0)
Monocytes Absolute: 0.7 10*3/uL (ref 0.1–1.0)
Monocytes Relative: 10 %
Neutro Abs: 4.5 10*3/uL (ref 1.7–7.7)
Neutrophils Relative %: 68 %
Platelets: 161 10*3/uL (ref 150–400)
RBC: 3.9 MIL/uL (ref 3.87–5.11)
RDW: 13.9 % (ref 11.5–15.5)
WBC: 6.7 10*3/uL (ref 4.0–10.5)
nRBC: 0 % (ref 0.0–0.2)

## 2022-11-30 MED ORDER — SODIUM CHLORIDE 0.9 % IV SOLN
200.0000 mg | Freq: Once | INTRAVENOUS | Status: AC
  Administered 2022-11-30: 200 mg via INTRAVENOUS
  Filled 2022-11-30: qty 8

## 2022-11-30 MED ORDER — SODIUM CHLORIDE 0.9 % IV SOLN
Freq: Once | INTRAVENOUS | Status: AC
  Filled 2022-11-30: qty 250

## 2022-11-30 MED ORDER — TRIAMCINOLONE ACETONIDE 0.5 % EX OINT: 1.0000 | TOPICAL_OINTMENT | Freq: Two times a day (BID) | CUTANEOUS | 1 refills | Status: DC

## 2022-11-30 NOTE — Progress Notes (Signed)
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  Histologic Grade (WHO / ISUP Grade): G3, nucleoli conspicuous and  eosinophilic at 123XX123 X magnification  Tumor Extent: Extends into major vein (segmental branch adjacent to  renal sinus)  Sarcomatoid Features: Not identified  Rhabdoid Features: Not identified  Tumor Necrosis: Present, microscopic and macroscopic, 20%   MARGINS  Margin Status: All margins negative for invasive carcinoma   REGIONAL LYMPH NODES  Regional Lymph Node Status: Not applicable (no regional lymph nodes  submitted or found)   DISTANT METASTASIS  Distant Site(s) Involved, if applicable: Not applicable   PATHOLOGIC STAGE CLASSIFICATION (pTNM, AJCC 8th Edition):  TNM Descriptors: Applicable  [id number]  pN - Not assigned (no lymph nodes submitted or found)  pM - Not applicable   # 123XX123- stage III; G-3; no sarcomatoid features [right nephrectomy- Dr.Sninksi].  # FEB 14th, 2023- CT AP [GI; [facility]]- . Large mass exophytic extending from lower to mid pole of the RIGHT kidney consistent with renal neoplasm. [Dr.Sninski; [facility]]  # FEB 2023- Right lower lobe subpleural 4 mm lung nodule; bone survey negative  #April 7th, 2023- On adjuvant [name]    # CKD III [ sec to HTN > 20 years; none now]; Anemia- EGD/colo- 2023- [[facility]]; Dr.Kowalski [No CAD]   Clear cell carcinoma of kidney, right ([place])  12/12/2021 Initial Diagnosis   Clear cell renal cell carcinoma, right ([place])   12/18/2021 - 04/05/2022 Chemotherapy   Patient is on Treatment Plan : RENAL CELL Pembrolizumab (200) q21d     12/18/2021 -  Chemotherapy   Patient is on Treatment  Plan : kidney adjuvant Pembrolizumab (200) q21d     01/08/2022 Cancer Staging   Staging form: Kidney, AJCC 8th Edition - Pathologic: Stage III (pT3a, pN0, cM0) - Signed by Cammie Sickle, MD on 01/08/2022    HISTORY OF PRESENTING ILLNESS: Ambulating independently.  With husband.   Gabrielle Savage 77 y.o.  female with history of stage III-IV kidney cancer; CKD stage III currently on adjuvant Beryle Flock is here for follow-up.    Itchy rash on arms and shoulders; she was told it is from the [place]. States the rash is better. Appetite is good. Fatigue all the time. Bowels normal. improvement with kenalog.   Appetite is great. No blood in urine.   Patient was to have gained weight.  Complains of mild swelling of the legs. Denies any worsening shortness of breath or cough.  Denies any worsening joint pains.  No nausea no vomiting.  Review of Systems  Constitutional:  Positive for malaise/fatigue. Negative for chills and fever.  HENT:  Negative for nosebleeds and sore throat.   Eyes:  Negative for double vision.  Respiratory:  Negative for cough, hemoptysis, sputum production and wheezing.   Cardiovascular:  Negative for chest pain, palpitations, orthopnea and leg swelling.  Gastrointestinal:  Negative for abdominal pain, blood in stool, constipation, diarrhea, heartburn, melena, nausea and vomiting.  Genitourinary:  Negative for dysuria, frequency and urgency.  Musculoskeletal:  Positive for back pain and joint pain.  Skin:  Positive for itching and rash.  Neurological:  Negative for dizziness, tingling, focal weakness, weakness and headaches.  Endo/Heme/Allergies:  Does not bruise/bleed  easily.  Psychiatric/Behavioral:  Negative for depression. The patient is not nervous/anxious and does not have insomnia.      MEDICAL HISTORY:  Past Medical History:  Diagnosis Date   Aortic atherosclerosis (HCC)    Arthritis    B12 deficiency    Bradycardia    CAD (coronary artery disease)     Carotid atherosclerosis    CKD (chronic kidney disease), stage III (HCC)    Diastolic dysfunction [id number]   a.)  TTE 09/03/2021: EF 63%; no RWMAs; LA mildly enlarged; trivial TR/PR, mild AR, moderate MR; G1DD.   DOE (dyspnea on exertion)    Fatigue    Gout    HLD (hyperlipidemia)    Hypertension    IDA (iron deficiency anemia)    Iliotibial band syndrome, left leg    MDD (major depressive disorder)    OAB (overactive bladder)    Obesity    PAC (premature atrial contraction)    noted on Holter   PONV (postoperative nausea and vomiting)    a.) single episode in 03/2013   PVC (premature ventricular contraction)    noted on Holter   Right kidney mass 10/27/2021   a.) CT 10/27/2021 -- mass measuring 8.3 x 6.8 x 8.1 cm   Sensorineural hearing loss (SNHL) of both ears    SUI (stress urinary incontinence, female)    SVT (supraventricular tachycardia)    noted on Holter   T2DM (type 2 diabetes mellitus) ([place])    Uterovaginal prolapse    Vitamin D deficiency    Voiding dysfunction     SURGICAL HISTORY: Past Surgical History:  Procedure Laterality Date   ABDOMINAL HYSTERECTOMY     partial   BLADDER SUSPENSION     CHOLECYSTECTOMY     COLONOSCOPY N/A 07/25/2017   Procedure: COLONOSCOPY;  Surgeon: [name], MD;  [facility];  Service: Endoscopy;  Laterality: N/A;   COLONOSCOPY WITH PROPOFOL N/A 10/19/2021   Procedure: COLONOSCOPY WITH PROPOFOL;  Surgeon: [name], MD;  Location: [facility] ENDOSCOPY;  Service: Endoscopy;  Laterality: N/A;  DM   ESOPHAGOGASTRODUODENOSCOPY (EGD) WITH PROPOFOL N/A 10/19/2021   Procedure: ESOPHAGOGASTRODUODENOSCOPY (EGD) WITH PROPOFOL;  Surgeon: [name], MD;  Location: [facility] ENDOSCOPY;  Service: Endoscopy;  Laterality: N/A;   JOINT REPLACEMENT Bilateral    PARTIAL KNEE REPLACEMENT   KNEE SURGERY     LAPAROSCOPIC NEPHRECTOMY, HAND ASSISTED Right 11/20/2021   Procedure: HAND ASSISTED LAPAROSCOPIC RADICAL  NEPHRECTOMY;  Surgeon: Billey Co, MD;  Location: [facility] ORS;  Service: Urology;  Laterality: Right;   PARATHYROIDECTOMY      SOCIAL HISTORY: Social History   Socioeconomic History   Marital status: Married    Spouse name: Not on file   Number of children: Not on file   Years of education: Not on file   Highest education level: Not on file  Occupational History   Not on file  Tobacco Use   Smoking status: Never    Passive exposure: Never   Smokeless tobacco: Never  Vaping Use   Vaping Use: Never used  Substance and Sexual Activity   Alcohol use: Not Currently    Comment: occ   Drug use: No   Sexual activity: Not Currently  Other Topics Concern   Not on file  Social History Narrative   Lives in [place] - close hospital; with husband; 3 biological children [[place]; [place]; [place]]. Never smoked; glass of wine rare. 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    Social Determinants of Health  Financial Resource Strain: Low Risk  (12/07/2021)   Overall Financial Resource Strain (CARDIA)    Difficulty of Paying Living Expenses: Not hard at all  Food Insecurity: No Food Insecurity (12/07/2021)   Hunger Vital Sign    Worried About Running Out of Food in the Last Year: Never true    Ran Out of Food in the Last Year: Never true  Transportation Needs: No Transportation Needs (12/07/2021)   PRAPARE - Hydrologist (Medical): No    Lack of Transportation (Non-Medical): No  Physical Activity: Insufficiently Active (12/07/2021)   Exercise Vital Sign    Days of Exercise per Week: 2 days    Minutes of Exercise per Session: 30 min  Stress: No Stress Concern Present (12/07/2021)   [place]    Feeling of Stress : Only a little  Social Connections: Moderately Integrated (12/07/2021)   Social Connection and Isolation Panel [NHANES]    Frequency of Communication with Friends and Family: Three  times a week    Frequency of Social Gatherings with Friends and Family: Three times a week    Attends Religious Services: 1 to 4 times per year    Active Member of Clubs or Organizations: No    Attends Archivist Meetings: Never    Marital Status: Married  Human resources officer Violence: Not At Risk (12/07/2021)   Humiliation, Afraid, Rape, and Kick questionnaire    Fear of Current or Ex-Partner: No    Emotionally Abused: No    Physically Abused: No    Sexually Abused: No    FAMILY HISTORY: Family History  Problem Relation Age of Onset   Heart failure Mother    Stroke Father    Lung cancer Father    Breast cancer Neg Hx     ALLERGIES:  is allergic to codeine, nsaids, and oxycodone.  MEDICATIONS:  Current Outpatient Medications  Medication Sig Dispense Refill   allopurinol (ZYLOPRIM) 100 MG tablet Take 300 mg by mouth every morning. Pt has decreased to 100mg      amLODipine (NORVASC) 5 MG tablet Take 5 mg by mouth every morning.     citalopram (CELEXA) 10 MG tablet Take 10 mg by mouth every morning.     ferrous sulfate 325 (65 FE) MG tablet Take 325 mg by mouth every other day.     irbesartan (AVAPRO) 150 MG tablet Take 150 mg by mouth every morning.     vitamin B-12 (CYANOCOBALAMIN) 1000 MCG tablet Take 1,000 mcg by mouth daily.     docusate sodium (COLACE) 100 MG capsule Take 1 capsule (100 mg total) by mouth 2 (two) times daily. (Patient not taking: Reported on 07/06/2022) 20 capsule 0   triamcinolone ointment (KENALOG) 0.5 % Apply 1 Application topically 2 (two) times daily. 30 g 1   No current facility-administered medications for this visit.      Marland Kitchen  PHYSICAL EXAMINATION:  Vitals:   11/30/22 1455  BP: (!) 149/57  Pulse: 66  Resp: 17  Temp: 99 F (37.2 C)  SpO2: 100%   Filed Weights   11/30/22 1455  Weight: 198 lb 9.6 oz (90.1 kg)   Mild maculopapular rash noted in the back and also front.   Physical Exam Vitals and nursing note reviewed.  HENT:      Head: Normocephalic and atraumatic.     Mouth/Throat:     Pharynx: Oropharynx is clear.  Eyes:     Extraocular Movements:  Extraocular movements intact.     Pupils: Pupils are equal, round, and reactive to light.  Cardiovascular:     Rate and Rhythm: Normal rate and regular rhythm.  Pulmonary:     Comments: Decreased breath sounds bilaterally.  Abdominal:     Palpations: Abdomen is soft.  Musculoskeletal:        General: Normal range of motion.     Cervical back: Normal range of motion.  Skin:    General: Skin is warm.  Neurological:     General: No focal deficit present.     Mental Status: She is alert and oriented to person, place, and time.  Psychiatric:        Behavior: Behavior normal.        Judgment: Judgment normal.      LABORATORY DATA:  I have reviewed the data as listed Lab Results  Component Value Date   WBC 6.7 11/30/2022   HGB 11.7 (L) 11/30/2022   HCT 37.2 11/30/2022   MCV 95.4 11/30/2022   PLT 161 11/30/2022   Recent Labs    10/19/22 0910 11/09/22 0943 11/30/22 1442  NA 139 137 137  K 4.6 4.2 4.4  CL 108 107 108  CO2 22 21* 23  GLUCOSE 122* 124* 105*  BUN 59* 57* 44*  CREATININE 2.46* 2.20* 2.25*  CALCIUM 9.5 9.7 9.1  GFRNONAA 20* 23* 22*  PROT 6.9 7.3 6.9  ALBUMIN 4.1 4.2 4.2  AST 15 13* 16  ALT 18 12 12   ALKPHOS 102 116 115  BILITOT 0.4 0.5 0.4    RADIOGRAPHIC STUDIES: I have personally reviewed the radiological images as listed and agreed with the findings in the report. No results found.  Clear cell carcinoma of kidney, right ([place]) #Stage IIIa-clear-cell renal carcinoma-on adjuvant [name].  Tolerating [name] fairly well-except for mild skin rash see below.  # proceed with [name] #15 today of planned 17. CBC CMP are reviewed; adequate today; Proceed with treatment today.  DEC 2023- TSH-WNL.  stable  # Skin rash-likely secondary to [name]--s/p kenalog ointment; and Claritin.  Monitor closely after restarting  [name].stable. refilled today.   # Intermittently- elevated calcium- 10.4; STOP vit D 50k; today calcium is 10.0.  Vit D levels- 69 [SEP 2023]- stable.    # Anemia: Hemoglobin around 11-12 overall stable/improving; on PO iron every other day. NOV 2023- Iron sat-35%; ferretin- 330-stable.        #CKD stage IV- GFR ~20; Continue enough hydration. Monitor closely. [s/p [facility] evaluation on June 1st, 2023]-stable.   #Diabetes: patient OFF metformin; currently on glimepiride [as per nephrology]--sstable.    # IV ACCESS: PIV.    # DISPOSITION: # proceed with [place] today # follow up in 3 weeks/Tuesday or Wednesday - MD; labs- cbc/cmp;TSH-  [name]- Dr.B  All questions were answered. The patient knows to call the clinic with any problems, questions or concerns.   Cammie Sickle, MD 11/30/2022 3:27 PM

## 2022-11-30 NOTE — Assessment & Plan Note (Signed)
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  Tolerating [name] fairly well-except for mild skin rash see below.  # proceed with [name] #15 today of planned 17. CBC CMP are reviewed; adequate today; Proceed with treatment today.  DEC 2023- TSH-WNL.  stable  # Skin rash-likely secondary to [name]--s/p kenalog ointment; and Claritin.  Monitor closely after restarting [name].stable. refilled today.   # Intermittently- elevated calcium- 10.4; STOP vit D 50k; today calcium is 10.0.  Vit D levels- 69 [SEP 2023]- stable.    # Anemia: Hemoglobin around 11-12 overall stable/improving; on PO iron every other day. NOV 2023- Iron sat-35%; ferretin- 330-stable.        #CKD stage IV- GFR ~20; Continue enough hydration. Monitor closely. [s/p [facility] evaluation on June 1st, 2023]-stable.   #Diabetes: patient OFF metformin; currently on glimepiride [as per nephrology]--sstable.    # IV ACCESS: PIV.    # DISPOSITION: # proceed with [place] today # follow up in 3 weeks/Tuesday or Wednesday - MD; labs- cbc/cmp;TSH-  [name]- Dr.B

## 2022-11-30 NOTE — Progress Notes (Signed)
Itchy rash on arms and shoulders; she was told it is from the [place]. States the rash is better. Appetite is good. Fatigue all the time. Bowels normal.

## 2022-12-14 ENCOUNTER — Other Ambulatory Visit: Payer: Self-pay

## 2022-12-16 ENCOUNTER — Encounter: Payer: Self-pay | Admitting: Urology

## 2022-12-16 ENCOUNTER — Ambulatory Visit (INDEPENDENT_AMBULATORY_CARE_PROVIDER_SITE_OTHER): Payer: Medicare Other | Admitting: Urology

## 2022-12-16 VITALS — BP 156/78 | HR 72 | Ht 61.0 in | Wt 196.0 lb

## 2022-12-16 DIAGNOSIS — N184 Chronic kidney disease, stage 4 (severe): Secondary | ICD-10-CM

## 2022-12-16 DIAGNOSIS — C649 Malignant neoplasm of unspecified kidney, except renal pelvis: Secondary | ICD-10-CM | POA: Diagnosis not present

## 2022-12-16 NOTE — Progress Notes (Signed)
   12/16/2022 12:29 PM   Gabrielle Savage May 06, 1946 [id number]  Reason for visit: Follow up kidney cancer, CKD  HPI: 77 year old female who presented with a 8cm right renal mass worrisome for RCC, and some possible small retroperitoneal lymph nodes that were indeterminate, as well as an indeterminate 4 mm right lower lobe subpleural nodule.  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  She follows with Dr. Rogue Bussing with oncology, and has been receiving adjuvant [name].  At our last visit in October 2023 I ordered surveillance imaging, but this was never completed for unclear reasons.  She has an appointment next week with oncology and they are planning to order additional imaging at that time.  In the setting of her CKD, I think an MRI abdomen and pelvis with chest imaging would be the best option to evaluate for any recurrence or progression of her disease.  We discussed the importance of follow-up imaging again today.  She overall is doing well.  She is having some bothersome itching from the [facility] but otherwise denies any complaints.  Renal function stable with creatinine of 2.25(eGFR 22) which is stable over the last 4 months. She follows closely with nephrology.  We discussed importance of adequate hydration, blood pressure control, and avoiding NSAIDs.  Continue oncology follow-up, they will reportedly order cross-sectional imaging within the next 1 to 2 months RTC 6 months  [place]ley [place], [place] [address] [place], [place] [address] [phone]

## 2022-12-17 ENCOUNTER — Other Ambulatory Visit: Payer: Self-pay

## 2022-12-21 ENCOUNTER — Inpatient Hospital Stay: Payer: Medicare Other

## 2022-12-21 ENCOUNTER — Encounter: Payer: Self-pay | Admitting: Internal Medicine

## 2022-12-21 ENCOUNTER — Inpatient Hospital Stay: Payer: Medicare Other | Attending: Internal Medicine

## 2022-12-21 ENCOUNTER — Inpatient Hospital Stay (HOSPITAL_BASED_OUTPATIENT_CLINIC_OR_DEPARTMENT_OTHER): Payer: Medicare Other | Admitting: Internal Medicine

## 2022-12-21 VITALS — BP 158/68 | HR 61 | Temp 97.8°F | Resp 16 | Ht 61.0 in | Wt 199.6 lb

## 2022-12-21 DIAGNOSIS — R946 Abnormal results of thyroid function studies: Secondary | ICD-10-CM

## 2022-12-21 DIAGNOSIS — Z5112 Encounter for antineoplastic immunotherapy: Secondary | ICD-10-CM | POA: Insufficient documentation

## 2022-12-21 DIAGNOSIS — C641 Malignant neoplasm of right kidney, except renal pelvis: Secondary | ICD-10-CM

## 2022-12-21 DIAGNOSIS — Z79620 Long term (current) use of immunosuppressive biologic: Secondary | ICD-10-CM | POA: Diagnosis not present

## 2022-12-21 DIAGNOSIS — Z79899 Other long term (current) drug therapy: Secondary | ICD-10-CM | POA: Insufficient documentation

## 2022-12-21 LAB — COMPREHENSIVE METABOLIC PANEL
ALT: 13 U/L (ref 0–44)
AST: 13 U/L — ABNORMAL LOW (ref 15–41)
Albumin: 4.1 g/dL (ref 3.5–5.0)
Alkaline Phosphatase: 123 U/L (ref 38–126)
Anion gap: 6 (ref 5–15)
BUN: 51 mg/dL — ABNORMAL HIGH (ref 8–23)
CO2: 21 mmol/L — ABNORMAL LOW (ref 22–32)
Calcium: 9.3 mg/dL (ref 8.9–10.3)
Chloride: 114 mmol/L — ABNORMAL HIGH (ref 98–111)
Creatinine, Ser: 2.15 mg/dL — ABNORMAL HIGH (ref 0.44–1.00)
GFR, Estimated: 23 mL/min — ABNORMAL LOW (ref >60)
Glucose, Bld: 134 mg/dL — ABNORMAL HIGH (ref 70–99)
Potassium: 4.8 mmol/L (ref 3.5–5.1)
Sodium: 141 mmol/L (ref 135–145)
Total Bilirubin: 0.5 mg/dL (ref 0.3–1.2)
Total Protein: 7.1 g/dL (ref 6.5–8.1)

## 2022-12-21 LAB — CBC WITH DIFFERENTIAL/PLATELET
Abs Immature Granulocytes: 0.03 10*3/uL (ref 0.00–0.07)
Basophils Absolute: 0 10*3/uL (ref 0.0–0.1)
Basophils Relative: 1 %
Eosinophils Absolute: 0.4 10*3/uL (ref 0.0–0.5)
Eosinophils Relative: 6 %
HCT: 37.9 % (ref 36.0–46.0)
Hemoglobin: 12 g/dL (ref 12.0–15.0)
Immature Granulocytes: 1 %
Lymphocytes Relative: 21 %
Lymphs Abs: 1.3 10*3/uL (ref 0.7–4.0)
MCH: 30.2 pg (ref 26.0–34.0)
MCHC: 31.7 g/dL (ref 30.0–36.0)
MCV: 95.2 fL (ref 80.0–100.0)
Monocytes Absolute: 0.6 10*3/uL (ref 0.1–1.0)
Monocytes Relative: 10 %
Neutro Abs: 3.7 10*3/uL (ref 1.7–7.7)
Neutrophils Relative %: 61 %
Platelets: 163 10*3/uL (ref 150–400)
RBC: 3.98 MIL/uL (ref 3.87–5.11)
RDW: 13.5 % (ref 11.5–15.5)
WBC: 6 10*3/uL (ref 4.0–10.5)
nRBC: 0 % (ref 0.0–0.2)

## 2022-12-21 LAB — TSH: TSH: 3.737 u[IU]/mL (ref 0.350–4.500)

## 2022-12-21 MED ORDER — TRIAMCINOLONE ACETONIDE 0.5 % EX OINT: 1.0000 | TOPICAL_OINTMENT | Freq: Two times a day (BID) | CUTANEOUS | 1 refills | Status: DC

## 2022-12-21 MED ORDER — HYDROXYZINE HCL 10 MG PO TABS: 10.0000 mg | ORAL_TABLET | Freq: Every evening | ORAL | 0 refills | Status: DC | PRN

## 2022-12-21 MED ORDER — SODIUM CHLORIDE 0.9 % IV SOLN
200.0000 mg | Freq: Once | INTRAVENOUS | Status: AC
  Administered 2022-12-21: 200 mg via INTRAVENOUS
  Filled 2022-12-21: qty 8

## 2022-12-21 MED ORDER — SODIUM CHLORIDE 0.9 % IV SOLN
Freq: Once | INTRAVENOUS | Status: AC
  Filled 2022-12-21: qty 250

## 2022-12-21 NOTE — Progress Notes (Signed)
Patient is still having trouble with itching, this interrupts her sleep. Depressed about gaining weight and she "doesn't eat".

## 2022-12-21 NOTE — Progress Notes (Signed)
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  Histologic Grade (WHO / ISUP Grade): G3, nucleoli conspicuous and  eosinophilic at 100 X magnification  Tumor Extent: Extends into major vein (segmental branch adjacent to  renal sinus)  Sarcomatoid Features: Not identified  Rhabdoid Features: Not identified  Tumor Necrosis: Present, microscopic and macroscopic, 20%   MARGINS  Margin Status: All margins negative for invasive carcinoma   REGIONAL LYMPH NODES  Regional Lymph Node Status: Not applicable (no regional lymph nodes  submitted or found)   DISTANT METASTASIS  Distant Site(s) Involved, if applicable: Not applicable   PATHOLOGIC STAGE CLASSIFICATION (pTNM, AJCC 8th Edition):  TNM Descriptors: Applicable  pT3a  pN - Not assigned (no lymph nodes submitted or found)  pM - Not applicable   # PT3A- stage III; G-3; no sarcomatoid features [right nephrectomy- Dr.Sninksi].  # FEB 14th, 2023- CT AP [GI; [facility]]- . Large mass exophytic extending from lower to mid pole of the RIGHT kidney consistent with renal neoplasm. [Dr.Sninski; Uorlogy]  # FEB 2023- Right lower lobe subpleural 4 mm lung nodule; bone survey negative  #April 7th, 2023- On adjuvant [name]    # CKD III [ sec to HTN > 20 years; none now]; Anemia- EGD/colo- 2023- [[facility]]; Dr.Kowalski [No CAD]   Clear cell carcinoma of kidney, right  12/12/2021 Initial Diagnosis   Clear cell renal cell carcinoma, right (HCC)   12/18/2021 - 04/05/2022 Chemotherapy   Patient is on Treatment Plan : RENAL CELL Pembrolizumab (200) q21d     12/18/2021 -  Chemotherapy   Patient is on Treatment Plan  : kidney adjuvant Pembrolizumab (200) q21d     01/08/2022 Cancer Staging   Staging form: Kidney, AJCC 8th Edition - Pathologic: Stage III (pT3a, pN0, cM0) - Signed by Earna CoderBrahmanday, Wakisha Alberts R, MD on 01/08/2022    HISTORY OF PRESENTING ILLNESS: Ambulating independently.  Alone.   Gabrielle Savage 77 y.o.  female with history of stage III-IV kidney cancer; CKD stage III currently on adjuvant Rande LawmanKeytruda is here for follow-up.    Patient is still having trouble with itching, this interrupts her sleep.  improvement with kenalog. But no resolution noted. Feels miserable.   Depressed about gaining weight and she "doesn't eat".   Complains of mild swelling of the legs. Denies any worsening shortness of breath or cough.  Denies any worsening joint pains.  No nausea no vomiting. Noted to left leg cramps.  Review of Systems  Constitutional:  Positive for malaise/fatigue. Negative for chills and fever.  HENT:  Negative for nosebleeds and sore throat.   Eyes:  Negative for double vision.  Respiratory:  Negative for cough, hemoptysis, sputum production and wheezing.   Cardiovascular:  Negative for chest pain, palpitations, orthopnea and leg swelling.  Gastrointestinal:  Negative for abdominal pain, blood in stool, constipation, diarrhea, heartburn, melena, nausea and vomiting.  Genitourinary:  Negative for dysuria, frequency and urgency.  Musculoskeletal:  Positive for back pain and joint pain.  Skin:  Positive for itching and rash.  Neurological:  Negative for dizziness, tingling, focal weakness, weakness and headaches.  Endo/Heme/Allergies:  Does not bruise/bleed easily.  Psychiatric/Behavioral:  Negative for depression. The patient is not nervous/anxious and  does not have insomnia.      MEDICAL HISTORY:  Past Medical History:  Diagnosis Date   Aortic atherosclerosis    Arthritis    B12 deficiency    Bradycardia    CAD (coronary artery disease)    Carotid atherosclerosis    CKD (chronic kidney  disease), stage III    Diastolic dysfunction 09/03/2021   a.)  TTE 09/03/2021: EF 63%; no RWMAs; LA mildly enlarged; trivial TR/PR, mild AR, moderate MR; G1DD.   DOE (dyspnea on exertion)    Fatigue    Gout    HLD (hyperlipidemia)    Hypertension    IDA (iron deficiency anemia)    Iliotibial band syndrome, left leg    MDD (major depressive disorder)    OAB (overactive bladder)    Obesity    PAC (premature atrial contraction)    noted on Holter   PONV (postoperative nausea and vomiting)    a.) single episode in 03/2013   PVC (premature ventricular contraction)    noted on Holter   Right kidney mass 10/27/2021   a.) CT 10/27/2021 -- mass measuring 8.3 x 6.8 x 8.1 cm   Sensorineural hearing loss (SNHL) of both ears    SUI (stress urinary incontinence, female)    SVT (supraventricular tachycardia)    noted on Holter   T2DM (type 2 diabetes mellitus)    Uterovaginal prolapse    Vitamin D deficiency    Voiding dysfunction     SURGICAL HISTORY: Past Surgical History:  Procedure Laterality Date   ABDOMINAL HYSTERECTOMY     partial   BLADDER SUSPENSION     CHOLECYSTECTOMY     COLONOSCOPY N/A 07/25/2017   Procedure: COLONOSCOPY;  Surgeon: Christena Deem, MD;  Location: [facility] ENDOSCOPY;  Service: Endoscopy;  Laterality: N/A;   COLONOSCOPY WITH PROPOFOL N/A 10/19/2021   Procedure: COLONOSCOPY WITH PROPOFOL;  Surgeon: Regis Bill, MD;  Location: [facility] ENDOSCOPY;  Service: Endoscopy;  Laterality: N/A;  DM   ESOPHAGOGASTRODUODENOSCOPY (EGD) WITH PROPOFOL N/A 10/19/2021   Procedure: ESOPHAGOGASTRODUODENOSCOPY (EGD) WITH PROPOFOL;  Surgeon: Regis Bill, MD;  Location: [facility] ENDOSCOPY;  Service: Endoscopy;  Laterality: N/A;   JOINT REPLACEMENT Bilateral    PARTIAL KNEE REPLACEMENT   KNEE SURGERY     LAPAROSCOPIC NEPHRECTOMY, HAND ASSISTED Right 11/20/2021   Procedure: HAND ASSISTED LAPAROSCOPIC RADICAL NEPHRECTOMY;  Surgeon: Sondra Come, MD;  Location: [facility] ORS;   Service: Urology;  Laterality: Right;   PARATHYROIDECTOMY      SOCIAL HISTORY: Social History   Socioeconomic History   Marital status: Married    Spouse name: Not on file   Number of children: Not on file   Years of education: Not on file   Highest education level: Not on file  Occupational History   Not on file  Tobacco Use   Smoking status: Never    Passive exposure: Never   Smokeless tobacco: Never  Vaping Use   Vaping Use: Never used  Substance and Sexual Activity   Alcohol use: Not Currently    Comment: occ   Drug use: No   Sexual activity: Not Currently  Other Topics Concern   Not on file  Social History Narrative   Lives in [place] - close hospital; with husband; 3 biological children [[place]; [place]; [place]]. Never smoked; glass of wine rare. 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    Social Determinants of Health   Financial Resource Strain: Low Risk  (12/07/2021)   Overall Financial Resource Strain (CARDIA)  Difficulty of Paying Living Expenses: Not hard at all  Food Insecurity: No Food Insecurity (12/07/2021)   Hunger Vital Sign    Worried About Running Out of Food in the Last Year: Never true    Ran Out of Food in the Last Year: Never true  Transportation Needs: No Transportation Needs (12/07/2021)   PRAPARE - Administrator, Civil Service (Medical): No    Lack of Transportation (Non-Medical): No  Physical Activity: Insufficiently Active (12/07/2021)   Exercise Vital Sign    Days of Exercise per Week: 2 days    Minutes of Exercise per Session: 30 min  Stress: No Stress Concern Present (12/07/2021)   [employer] of Occupational Health - Occupational Stress Questionnaire    Feeling of Stress : Only a little  Social Connections: Moderately Integrated (12/07/2021)   Social Connection and Isolation Panel [NHANES]    Frequency of Communication with Friends and Family: Three times a week    Frequency of Social Gatherings with Friends and  Family: Three times a week    Attends Religious Services: 1 to 4 times per year    Active Member of Clubs or Organizations: No    Attends Banker Meetings: Never    Marital Status: Married  Catering manager Violence: Not At Risk (12/07/2021)   Humiliation, Afraid, Rape, and Kick questionnaire    Fear of Current or Ex-Partner: No    Emotionally Abused: No    Physically Abused: No    Sexually Abused: No    FAMILY HISTORY: Family History  Problem Relation Age of Onset   Heart failure Mother    Stroke Father    Lung cancer Father    Breast cancer Neg Hx     ALLERGIES:  is allergic to codeine, nsaids, and oxycodone.  MEDICATIONS:  Current Outpatient Medications  Medication Sig Dispense Refill   allopurinol (ZYLOPRIM) 100 MG tablet Take 300 mg by mouth every morning. Pt has decreased to 100mg      amLODipine (NORVASC) 5 MG tablet Take 5 mg by mouth every morning.     citalopram (CELEXA) 10 MG tablet Take 10 mg by mouth every morning.     docusate sodium (COLACE) 100 MG capsule Take 1 capsule (100 mg total) by mouth 2 (two) times daily. 20 capsule 0   ferrous sulfate 325 (65 FE) MG tablet Take 325 mg by mouth every other day.     hydrOXYzine (ATARAX) 10 MG tablet Take 1 tablet (10 mg total) by mouth at bedtime as needed. For itching. 30 tablet 0   irbesartan (AVAPRO) 150 MG tablet Take 150 mg by mouth every morning.     vitamin B-12 (CYANOCOBALAMIN) 1000 MCG tablet Take 1,000 mcg by mouth daily.     triamcinolone ointment (KENALOG) 0.5 % Apply 1 Application topically 2 (two) times daily. 30 g 1   No current facility-administered medications for this visit.   Facility-Administered Medications Ordered in Other Visits  Medication Dose Route Frequency Provider Last Rate Last Admin   0.9 %  sodium chloride infusion   Intravenous Once [name], MD       pembrolizumab [facility]) 200 mg in sodium chloride 0.9 % 50 mL chemo infusion  200 mg Intravenous Once  [name], MD          .  PHYSICAL EXAMINATION:  Vitals:   12/21/22 0833  BP: (!) 158/68  Pulse: 61  Resp: 16  Temp: 97.8 F (36.6 C)  SpO2: 100%  Filed Weights   12/21/22 0833  Weight: 199 lb 9.6 oz (90.5 kg)   Mild maculopapular rash noted in the back and also front.   Physical Exam Vitals and nursing note reviewed.  HENT:     Head: Normocephalic and atraumatic.     Mouth/Throat:     Pharynx: Oropharynx is clear.  Eyes:     Extraocular Movements: Extraocular movements intact.     Pupils: Pupils are equal, round, and reactive to light.  Cardiovascular:     Rate and Rhythm: Normal rate and regular rhythm.  Pulmonary:     Comments: Decreased breath sounds bilaterally.  Abdominal:     Palpations: Abdomen is soft.  Musculoskeletal:        General: Normal range of motion.     Cervical back: Normal range of motion.  Skin:    General: Skin is warm.  Neurological:     General: No focal deficit present.     Mental Status: She is alert and oriented to person, place, and time.  Psychiatric:        Behavior: Behavior normal.        Judgment: Judgment normal.      LABORATORY DATA:  I have reviewed the data as listed Lab Results  Component Value Date   WBC 6.0 12/21/2022   HGB 12.0 12/21/2022   HCT 37.9 12/21/2022   MCV 95.2 12/21/2022   PLT 163 12/21/2022   Recent Labs    11/09/22 0943 11/30/22 1442 12/21/22 0832  NA 137 137 141  K 4.2 4.4 4.8  CL 107 108 114*  CO2 21* 23 21*  GLUCOSE 124* 105* 134*  BUN 57* 44* 51*  CREATININE 2.20* 2.25* 2.15*  CALCIUM 9.7 9.1 9.3  GFRNONAA 23* 22* 23*  PROT 7.3 6.9 7.1  ALBUMIN 4.2 4.2 4.1  AST 13* 16 13*  ALT 12 12 13   ALKPHOS 116 115 123  BILITOT 0.5 0.4 0.5    RADIOGRAPHIC STUDIES: I have personally reviewed the radiological images as listed and agreed with the findings in the report. No results found.  Clear cell carcinoma of kidney, right (HCC) #Stage IIIa-clear-cell renal carcinoma-on  adjuvant [name].  Tolerating [name] fairly well-except for mild skin rash see below.  # proceed with [name] #16 today of planned 17. CBC CMP are reviewed; adequate today; Proceed with treatment today.  DEC 2023- TSH-WNL.  stable  # Skin rash-likely secondary to [name]-- and allegra; Worse- recommend cerave; vaseline; atarax prn. Continue kenalog.   # Intermittently- elevated calcium- 10.4; STOP vit D 50k; today calcium is 10.0.  Vit D levels- 69 [SEP 2023]- stable.    # Anemia: Hemoglobin around 11-12 overall stable/improving; on PO iron every other day. NOV 2023- Iron sat-35%; ferretin- 330-stable.        #CKD stage IV- GFR ~20; Continue enough hydration. Monitor closely. [s/p Dr.Korrapati Nephrology evaluation on June 1st, 2023]-stable.   #Diabetes: patient OFF metformin;/glimepiride [as per nephrology]--stable.    # IV ACCESS: PIV.    # DISPOSITION: # proceed with [place] today # follow up in 3 weeks/Tuesday or Wednesday - MD; labs- cbc/cmp;TSH-  [name]- Dr.B  All questions were answered. The patient knows to call the clinic with any problems, questions or concerns.   [name], MD 12/21/2022 9:47 AM

## 2022-12-21 NOTE — Patient Instructions (Signed)
[facility] [place] [facility]  Discharge Instructions: Thank you for choosing [facility] to provide your oncology and hematology care.  If you have a lab appointment with the Cancer Center, please go directly to the Cancer Center and check in at the registration area.  Wear comfortable clothing and clothing appropriate for easy access to any Portacath or PICC line.   We strive to give you quality time with your provider. You may need to reschedule your appointment if you arrive late (15 or more minutes).  Arriving late affects you and other patients whose appointments are after yours.  Also, if you miss three or more appointments without notifying the office, you may be dismissed from the clinic at the provider's discretion.      For prescription refill requests, have your pharmacy contact our office and allow 72 hours for refills to be completed.    Today you received the following chemotherapy and/or immunotherapy agents- [name]      To help prevent nausea and vomiting after your treatment, we encourage you to take your nausea medication as directed.  BELOW ARE SYMPTOMS THAT SHOULD BE REPORTED IMMEDIATELY: *FEVER GREATER THAN 100.4 F (38 C) OR HIGHER *CHILLS OR SWEATING *NAUSEA AND VOMITING THAT IS NOT CONTROLLED WITH YOUR NAUSEA MEDICATION *UNUSUAL SHORTNESS OF BREATH *UNUSUAL BRUISING OR BLEEDING *URINARY PROBLEMS (pain or burning when urinating, or frequent urination) *BOWEL PROBLEMS (unusual diarrhea, constipation, pain near the anus) TENDERNESS IN MOUTH AND THROAT WITH OR WITHOUT PRESENCE OF ULCERS (sore throat, sores in mouth, or a toothache) UNUSUAL RASH, SWELLING OR PAIN  UNUSUAL VAGINAL DISCHARGE OR ITCHING   Items with * indicate a potential emergency and should be followed up as soon as possible or go to the Emergency Department if any problems should occur.  Please show the CHEMOTHERAPY ALERT CARD or IMMUNOTHERAPY ALERT CARD at check-in to  the Emergency Department and triage nurse.  Should you have questions after your visit or need to cancel or reschedule your appointment, please contact [facility] [place] [facility]  [phone] and follow the prompts.  Office hours are 8:00 a.m. to 4:30 p.m. Monday - Friday. Please note that voicemails left after 4:00 p.m. may not be returned until the following business day.  We are closed weekends and major holidays. You have access to a nurse at all times for urgent questions. Please call the main number to the clinic [phone] and follow the prompts.  For any non-urgent questions, you may also contact your provider using MyChart. We now offer e-Visits for anyone 18 and older to request care online for non-urgent symptoms. For details visit [email or web address].   Also download the MyChart app! Go to the app store, search "MyChart", open the app, select , and log in with your MyChart username and password.   

## 2022-12-21 NOTE — Assessment & Plan Note (Addendum)
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  Tolerating [name] fairly well-except for mild skin rash see below.  # proceed with [name] #16 today of planned 17. CBC CMP are reviewed; adequate today; Proceed with treatment today.  DEC 2023- TSH-WNL.  stable  # Skin rash-likely secondary to [name]-- and allegra; Worse- recommend cerave; vaseline; atarax prn. Continue kenalog.   # Intermittently- elevated calcium- 10.4; STOP vit D 50k; today calcium is 10.0.  Vit D levels- 69 [SEP 2023]- stable.    # Anemia: Hemoglobin around 11-12 overall stable/improving; on PO iron every other day. NOV 2023- Iron sat-35%; ferretin- 330-stable.        #CKD stage IV- GFR ~20; Continue enough hydration. Monitor closely. [s/p Dr.Korrapati Nephrology evaluation on June 1st, 2023]-stable.   #Diabetes: patient OFF metformin;/glimepiride [as per nephrology]--stable.    # IV ACCESS: PIV.    # DISPOSITION: # proceed with [place] today # follow up in 3 weeks/Tuesday or Wednesday - MD; labs- cbc/cmp;TSH-  [name]- Dr.B

## 2023-01-11 ENCOUNTER — Inpatient Hospital Stay: Payer: Medicare Other

## 2023-01-11 ENCOUNTER — Encounter: Payer: Self-pay | Admitting: Internal Medicine

## 2023-01-11 ENCOUNTER — Inpatient Hospital Stay (HOSPITAL_BASED_OUTPATIENT_CLINIC_OR_DEPARTMENT_OTHER): Payer: Medicare Other | Admitting: Internal Medicine

## 2023-01-11 VITALS — BP 140/55 | HR 72 | Temp 98.6°F | Ht 61.0 in | Wt 201.4 lb

## 2023-01-11 DIAGNOSIS — E559 Vitamin D deficiency, unspecified: Secondary | ICD-10-CM | POA: Diagnosis not present

## 2023-01-11 DIAGNOSIS — C641 Malignant neoplasm of right kidney, except renal pelvis: Secondary | ICD-10-CM | POA: Diagnosis not present

## 2023-01-11 DIAGNOSIS — N184 Chronic kidney disease, stage 4 (severe): Secondary | ICD-10-CM

## 2023-01-11 DIAGNOSIS — Z5112 Encounter for antineoplastic immunotherapy: Secondary | ICD-10-CM | POA: Diagnosis not present

## 2023-01-11 DIAGNOSIS — Z79899 Other long term (current) drug therapy: Secondary | ICD-10-CM

## 2023-01-11 LAB — CBC WITH DIFFERENTIAL/PLATELET
Abs Immature Granulocytes: 0.03 10*3/uL (ref 0.00–0.07)
Basophils Absolute: 0.1 10*3/uL (ref 0.0–0.1)
Basophils Relative: 1 %
Eosinophils Absolute: 0.5 10*3/uL (ref 0.0–0.5)
Eosinophils Relative: 8 %
HCT: 38.2 % (ref 36.0–46.0)
Hemoglobin: 11.9 g/dL — ABNORMAL LOW (ref 12.0–15.0)
Immature Granulocytes: 1 %
Lymphocytes Relative: 22 %
Lymphs Abs: 1.4 10*3/uL (ref 0.7–4.0)
MCH: 30 pg (ref 26.0–34.0)
MCHC: 31.2 g/dL (ref 30.0–36.0)
MCV: 96.2 fL (ref 80.0–100.0)
Monocytes Absolute: 0.6 10*3/uL (ref 0.1–1.0)
Monocytes Relative: 9 %
Neutro Abs: 3.8 10*3/uL (ref 1.7–7.7)
Neutrophils Relative %: 59 %
Platelets: 165 10*3/uL (ref 150–400)
RBC: 3.97 MIL/uL (ref 3.87–5.11)
RDW: 13.7 % (ref 11.5–15.5)
WBC: 6.4 10*3/uL (ref 4.0–10.5)
nRBC: 0 % (ref 0.0–0.2)

## 2023-01-11 LAB — COMPREHENSIVE METABOLIC PANEL
ALT: 15 U/L (ref 0–44)
AST: 16 U/L (ref 15–41)
Albumin: 4 g/dL (ref 3.5–5.0)
Alkaline Phosphatase: 128 U/L — ABNORMAL HIGH (ref 38–126)
Anion gap: 7 (ref 5–15)
BUN: 46 mg/dL — ABNORMAL HIGH (ref 8–23)
CO2: 20 mmol/L — ABNORMAL LOW (ref 22–32)
Calcium: 9 mg/dL (ref 8.9–10.3)
Chloride: 110 mmol/L (ref 98–111)
Creatinine, Ser: 2.51 mg/dL — ABNORMAL HIGH (ref 0.44–1.00)
GFR, Estimated: 19 mL/min — ABNORMAL LOW (ref >60)
Glucose, Bld: 121 mg/dL — ABNORMAL HIGH (ref 70–99)
Potassium: 4.3 mmol/L (ref 3.5–5.1)
Sodium: 137 mmol/L (ref 135–145)
Total Bilirubin: 0.2 mg/dL — ABNORMAL LOW (ref 0.3–1.2)
Total Protein: 6.8 g/dL (ref 6.5–8.1)

## 2023-01-11 LAB — TSH: TSH: 4.074 u[IU]/mL (ref 0.350–4.500)

## 2023-01-11 MED ORDER — MONTELUKAST SODIUM 10 MG PO TABS: 10.0000 mg | ORAL_TABLET | Freq: Every day | ORAL | 1 refills | Status: DC

## 2023-01-11 MED ORDER — HYDROXYZINE HCL 10 MG PO TABS: 10.0000 mg | ORAL_TABLET | Freq: Every evening | ORAL | 0 refills | Status: DC | PRN

## 2023-01-11 MED ORDER — SODIUM CHLORIDE 0.9 % IV SOLN
Freq: Once | INTRAVENOUS | Status: AC
  Filled 2023-01-11: qty 250

## 2023-01-11 MED ORDER — SODIUM CHLORIDE 0.9 % IV SOLN
200.0000 mg | Freq: Once | INTRAVENOUS | Status: AC
  Administered 2023-01-11: 200 mg via INTRAVENOUS
  Filled 2023-01-11: qty 8

## 2023-01-11 NOTE — Progress Notes (Signed)
C/o itching all over, the hydroxyzine makes her sleep longer.

## 2023-01-11 NOTE — Progress Notes (Signed)
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  Histologic Grade (WHO / ISUP Grade): G3, nucleoli conspicuous and  eosinophilic at 100 X magnification  Tumor Extent: Extends into major vein (segmental branch adjacent to  renal sinus)  Sarcomatoid Features: Not identified  Rhabdoid Features: Not identified  Tumor Necrosis: Present, microscopic and macroscopic, 20%   MARGINS  Margin Status: All margins negative for invasive carcinoma   REGIONAL LYMPH NODES  Regional Lymph Node Status: Not applicable (no regional lymph nodes  submitted or found)   DISTANT METASTASIS  Distant Site(s) Involved, if applicable: Not applicable   PATHOLOGIC STAGE CLASSIFICATION (pTNM, AJCC 8th Edition):  TNM Descriptors: Applicable  pT3a  pN - Not assigned (no lymph nodes submitted or found)  pM - Not applicable   # PT3A- stage III; G-3; no sarcomatoid features [right nephrectomy- Dr.Sninksi].  # FEB 14th, 2023- CT AP [GI; [facility]]- . Large mass exophytic extending from lower to mid pole of the RIGHT kidney consistent with renal neoplasm. [Dr.Sninski; Uorlogy]  # FEB 2023- Right lower lobe subpleural 4 mm lung nodule; bone survey negative  #April 7th, 2023- On adjuvant [name]    # CKD III [ sec to HTN > 20 years; none now]; Anemia- EGD/colo- 2023- [[facility]]; Dr.Kowalski [No CAD]   Clear cell carcinoma of kidney, right (HCC)  12/12/2021 Initial Diagnosis   Clear cell renal cell carcinoma, right (HCC)   12/18/2021 - 04/05/2022 Chemotherapy   Patient is on Treatment Plan : RENAL CELL Pembrolizumab (200) q21d     12/18/2021 -  Chemotherapy   Patient is on Treatment  Plan : kidney adjuvant Pembrolizumab (200) q21d     01/08/2022 Cancer Staging   Staging form: Kidney, AJCC 8th Edition - Pathologic: Stage III (pT3a, pN0, cM0) - Signed by [name], MD on 01/08/2022    HISTORY OF PRESENTING ILLNESS: Ambulating independently.  Accompanied by husband.  Gabrielle Savage 77 y.o.  female with history of stage III-IV kidney cancer; CKD stage III currently on adjuvant [name] is here for follow-up.    Patient continues to complain of itching all over. The hydroxyzine makes her sleep longer.   Complains of mild swelling of the legs. Denies any worsening shortness of breath or cough.  Denies any worsening joint pains.  No nausea no vomiting.   Review of Systems  Constitutional:  Positive for malaise/fatigue. Negative for chills and fever.  HENT:  Negative for nosebleeds and sore throat.   Eyes:  Negative for double vision.  Respiratory:  Negative for cough, hemoptysis, sputum production and wheezing.   Cardiovascular:  Negative for chest pain, palpitations, orthopnea and leg swelling.  Gastrointestinal:  Negative for abdominal pain, blood in stool, constipation, diarrhea, heartburn, melena, nausea and vomiting.  Genitourinary:  Negative for dysuria, frequency and urgency.  Musculoskeletal:  Positive for back pain and joint pain.  Skin:  Positive for itching and rash.  Neurological:  Negative for dizziness, tingling, focal weakness, weakness and headaches.  Endo/Heme/Allergies:  Does not bruise/bleed easily.  Psychiatric/Behavioral:  Negative for depression. The patient is not nervous/anxious and does not have insomnia.      MEDICAL HISTORY:  Past Medical History:  Diagnosis Date  Aortic atherosclerosis (HCC)    Arthritis    B12 deficiency    Bradycardia    CAD (coronary artery disease)    Carotid atherosclerosis    CKD (chronic kidney disease), stage III (HCC)    Diastolic dysfunction 09/03/2021   a.)  TTE 09/03/2021: EF 63%; no RWMAs; LA  mildly enlarged; trivial TR/PR, mild AR, moderate MR; G1DD.   DOE (dyspnea on exertion)    Fatigue    Gout    HLD (hyperlipidemia)    Hypertension    IDA (iron deficiency anemia)    Iliotibial band syndrome, left leg    MDD (major depressive disorder)    OAB (overactive bladder)    Obesity    PAC (premature atrial contraction)    noted on Holter   PONV (postoperative nausea and vomiting)    a.) single episode in 03/2013   PVC (premature ventricular contraction)    noted on Holter   Right kidney mass 10/27/2021   a.) CT 10/27/2021 -- mass measuring 8.3 x 6.8 x 8.1 cm   Sensorineural hearing loss (SNHL) of both ears    SUI (stress urinary incontinence, female)    SVT (supraventricular tachycardia)    noted on Holter   T2DM (type 2 diabetes mellitus) (HCC)    Uterovaginal prolapse    Vitamin D deficiency    Voiding dysfunction     SURGICAL HISTORY: Past Surgical History:  Procedure Laterality Date   ABDOMINAL HYSTERECTOMY     partial   BLADDER SUSPENSION     CHOLECYSTECTOMY     COLONOSCOPY N/A 07/25/2017   Procedure: COLONOSCOPY;  Surgeon: Christena Deem, MD;  Location: [facility];  Service: Endoscopy;  Laterality: N/A;   COLONOSCOPY WITH PROPOFOL N/A 10/19/2021   Procedure: COLONOSCOPY WITH PROPOFOL;  Surgeon: Regis Bill, MD;  Location: [facility] ENDOSCOPY;  Service: Endoscopy;  Laterality: N/A;  DM   ESOPHAGOGASTRODUODENOSCOPY (EGD) WITH PROPOFOL N/A 10/19/2021   Procedure: ESOPHAGOGASTRODUODENOSCOPY (EGD) WITH PROPOFOL;  Surgeon: Regis Bill, MD;  Location: [facility] ENDOSCOPY;  Service: Endoscopy;  Laterality: N/A;   JOINT REPLACEMENT Bilateral    PARTIAL KNEE REPLACEMENT   KNEE SURGERY     LAPAROSCOPIC NEPHRECTOMY, HAND ASSISTED Right 11/20/2021   Procedure: HAND ASSISTED LAPAROSCOPIC RADICAL NEPHRECTOMY;  Surgeon: Sondra Come, MD;  Location: [facility] ORS;  Service: Urology;  Laterality: Right;   PARATHYROIDECTOMY      SOCIAL HISTORY: Social History    Socioeconomic History   Marital status: Married    Spouse name: Not on file   Number of children: Not on file   Years of education: Not on file   Highest education level: Not on file  Occupational History   Not on file  Tobacco Use   Smoking status: Never    Passive exposure: Never   Smokeless tobacco: Never  Vaping Use   Vaping Use: Never used  Substance and Sexual Activity   Alcohol use: Not Currently    Comment: occ   Drug use: No   Sexual activity: Not Currently  Other Topics Concern   Not on file  Social History Narrative   Lives in [place] - close hospital; with husband; 3 biological children [[place]; [place]; [place]]. Never smoked; glass of wine rare. 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    Social Determinants of Health   Financial Resource Strain: Low Risk  (12/07/2021)   Overall Financial Resource Strain (CARDIA)    Difficulty of Paying Living Expenses: Not hard at all  Food Insecurity: No Food Insecurity (  12/07/2021)   Hunger Vital Sign    Worried About Running Out of Food in the Last Year: Never true    Ran Out of Food in the Last Year: Never true  Transportation Needs: No Transportation Needs (12/07/2021)   PRAPARE - Administrator, Civil Service (Medical): No    Lack of Transportation (Non-Medical): No  Physical Activity: Insufficiently Active (12/07/2021)   Exercise Vital Sign    Days of Exercise per Week: 2 days    Minutes of Exercise per Session: 30 min  Stress: No Stress Concern Present (12/07/2021)   [employer] of Occupational Health - Occupational Stress Questionnaire    Feeling of Stress : Only a little  Social Connections: Moderately Integrated (12/07/2021)   Social Connection and Isolation Panel [NHANES]    Frequency of Communication with Friends and Family: Three times a week    Frequency of Social Gatherings with Friends and Family: Three times a week    Attends Religious Services: 1 to 4 times per year    Active Member of  Clubs or Organizations: No    Attends Banker Meetings: Never    Marital Status: Married  Catering manager Violence: Not At Risk (12/07/2021)   Humiliation, Afraid, Rape, and Kick questionnaire    Fear of Current or Ex-Partner: No    Emotionally Abused: No    Physically Abused: No    Sexually Abused: No    FAMILY HISTORY: Family History  Problem Relation Age of Onset   Heart failure Mother    Stroke Father    Lung cancer Father    Breast cancer Neg Hx     ALLERGIES:  is allergic to codeine, nsaids, and oxycodone.  MEDICATIONS:  Current Outpatient Medications  Medication Sig Dispense Refill   allopurinol (ZYLOPRIM) 100 MG tablet Take 300 mg by mouth every morning. Pt has decreased to 100mg      amLODipine (NORVASC) 5 MG tablet Take 5 mg by mouth every morning.     citalopram (CELEXA) 10 MG tablet Take 10 mg by mouth every morning.     docusate sodium (COLACE) 100 MG capsule Take 1 capsule (100 mg total) by mouth 2 (two) times daily. 20 capsule 0   ferrous sulfate 325 (65 FE) MG tablet Take 325 mg by mouth every other day.     irbesartan (AVAPRO) 150 MG tablet Take 150 mg by mouth every morning.     montelukast (SINGULAIR) 10 MG tablet Take 1 tablet (10 mg total) by mouth at bedtime. 30 tablet 1   triamcinolone ointment (KENALOG) 0.5 % Apply 1 Application topically 2 (two) times daily. 30 g 1   vitamin B-12 (CYANOCOBALAMIN) 1000 MCG tablet Take 1,000 mcg by mouth daily.     hydrOXYzine (ATARAX) 10 MG tablet Take 1 tablet (10 mg total) by mouth at bedtime as needed. For itching. 30 tablet 0   No current facility-administered medications for this visit.      Marland Kitchen  PHYSICAL EXAMINATION:  Vitals:   01/11/23 0945  BP: (!) 140/55  Pulse: 72  Temp: 98.6 F (37 C)  SpO2: 94%    Filed Weights   01/11/23 0945  Weight: 201 lb 6.4 oz (91.4 kg)    Mild maculopapular rash noted in the back and also front.   Physical Exam Vitals and nursing note reviewed.  HENT:      Head: Normocephalic and atraumatic.     Mouth/Throat:     Pharynx: Oropharynx is clear.  Eyes:  Extraocular Movements: Extraocular movements intact.     Pupils: Pupils are equal, round, and reactive to light.  Cardiovascular:     Rate and Rhythm: Normal rate and regular rhythm.  Pulmonary:     Comments: Decreased breath sounds bilaterally.  Abdominal:     Palpations: Abdomen is soft.  Musculoskeletal:        General: Normal range of motion.     Cervical back: Normal range of motion.  Skin:    General: Skin is warm.  Neurological:     General: No focal deficit present.     Mental Status: She is alert and oriented to person, place, and time.  Psychiatric:        Behavior: Behavior normal.        Judgment: Judgment normal.      LABORATORY DATA:  I have reviewed the data as listed Lab Results  Component Value Date   WBC 6.4 01/11/2023   HGB 11.9 (L) 01/11/2023   HCT 38.2 01/11/2023   MCV 96.2 01/11/2023   PLT 165 01/11/2023   Recent Labs    11/30/22 1442 12/21/22 0832 01/11/23 0947  NA 137 141 137  K 4.4 4.8 4.3  CL 108 114* 110  CO2 23 21* 20*  GLUCOSE 105* 134* 121*  BUN 44* 51* 46*  CREATININE 2.25* 2.15* 2.51*  CALCIUM 9.1 9.3 9.0  GFRNONAA 22* 23* 19*  PROT 6.9 7.1 6.8  ALBUMIN 4.2 4.1 4.0  AST 16 13* 16  ALT 12 13 15   ALKPHOS 115 123 128*  BILITOT 0.4 0.5 0.2*    RADIOGRAPHIC STUDIES: I have personally reviewed the radiological images as listed and agreed with the findings in the report. No results found.  Clear cell carcinoma of kidney, right (HCC) #Stage IIIa-clear-cell renal carcinoma-on adjuvant [name].  Tolerating [name] fairly well-except for mild skin rash see below.  # proceed with [name] #17 today of planned 17-CBC CMP are reviewed; adequate today; Proceed with treatment today.  APRIL 2024- TSH-WNL.  stable  # Skin rash-likely secondary to [name]-- continue allegra; cerave; vaseline; atarax prn. Continue kenalog. Add  singulair. Worse-discussed with patient regarding steroids however given concerns of blood sugars HOLD off steoids.   # Intermittently- elevated calcium- 10.4; STOP vit D 50k; today calcium is 10.0.  Vit D levels- 69 [SEP 2023]- stable.    # Anemia: Hemoglobin around 11-12 overall stable/improving; on PO iron every other day. NOV 2023- Iron sat-35%; ferretin- 330-stable.   #CKD stage IV- GFR ~19; Continue enough hydration. Monitor closely. [s/p Dr.Korrapati Nephrology evaluation next week. 2023]-stable.   #Diabetes: patient OFF metformin;/glimepiride [as per nephrology]-stable.   # IV ACCESS: PIV.    # DISPOSITION: # proceed with [place] today # follow up in 1months - MD; labs- cbc/cmp;TSH; no treatment- non-contrast CT scan CAP prior- Dr.B  All questions were answered. The patient knows to call the clinic with any problems, questions or concerns.   [name], MD 01/11/2023 10:38 AM

## 2023-01-11 NOTE — Patient Instructions (Signed)
[facility] [place] [facility]  Discharge Instructions: Thank you for choosing [facility] to provide your oncology and hematology care.  If you have a lab appointment with the Cancer Center, please go directly to the Cancer Center and check in at the registration area.  Wear comfortable clothing and clothing appropriate for easy access to any Portacath or PICC line.   We strive to give you quality time with your provider. You may need to reschedule your appointment if you arrive late (15 or more minutes).  Arriving late affects you and other patients whose appointments are after yours.  Also, if you miss three or more appointments without notifying the office, you may be dismissed from the clinic at the provider's discretion.      For prescription refill requests, have your pharmacy contact our office and allow 72 hours for refills to be completed.    Today you received the following chemotherapy and/or immunotherapy agents- [name]      To help prevent nausea and vomiting after your treatment, we encourage you to take your nausea medication as directed.  BELOW ARE SYMPTOMS THAT SHOULD BE REPORTED IMMEDIATELY: *FEVER GREATER THAN 100.4 F (38 C) OR HIGHER *CHILLS OR SWEATING *NAUSEA AND VOMITING THAT IS NOT CONTROLLED WITH YOUR NAUSEA MEDICATION *UNUSUAL SHORTNESS OF BREATH *UNUSUAL BRUISING OR BLEEDING *URINARY PROBLEMS (pain or burning when urinating, or frequent urination) *BOWEL PROBLEMS (unusual diarrhea, constipation, pain near the anus) TENDERNESS IN MOUTH AND THROAT WITH OR WITHOUT PRESENCE OF ULCERS (sore throat, sores in mouth, or a toothache) UNUSUAL RASH, SWELLING OR PAIN  UNUSUAL VAGINAL DISCHARGE OR ITCHING   Items with * indicate a potential emergency and should be followed up as soon as possible or go to the Emergency Department if any problems should occur.  Please show the CHEMOTHERAPY ALERT CARD or IMMUNOTHERAPY ALERT CARD at check-in to  the Emergency Department and triage nurse.  Should you have questions after your visit or need to cancel or reschedule your appointment, please contact [facility] [place] [facility]  [phone] and follow the prompts.  Office hours are 8:00 a.m. to 4:30 p.m. Monday - Friday. Please note that voicemails left after 4:00 p.m. may not be returned until the following business day.  We are closed weekends and major holidays. You have access to a nurse at all times for urgent questions. Please call the main number to the clinic [phone] and follow the prompts.  For any non-urgent questions, you may also contact your provider using MyChart. We now offer e-Visits for anyone 18 and older to request care online for non-urgent symptoms. For details visit [email or web address].   Also download the MyChart app! Go to the app store, search "MyChart", open the app, select [facility], and log in with your MyChart username and password.   

## 2023-01-11 NOTE — Assessment & Plan Note (Addendum)
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  Tolerating [name] fairly well-except for mild skin rash see below.  # proceed with [name] #17 today of planned 17-CBC CMP are reviewed; adequate today; Proceed with treatment today.  APRIL 2024- TSH-WNL.  stable  # Skin rash-likely secondary to [name]-- continue allegra; cerave; vaseline; atarax prn. Continue kenalog. Add singulair. Worse-discussed with patient regarding steroids however given concerns of blood sugars HOLD off steoids.   # Intermittently- elevated calcium- 10.4; STOP vit D 50k; today calcium is 10.0.  Vit D levels- 69 [SEP 2023]- stable.    # Anemia: Hemoglobin around 11-12 overall stable/improving; on PO iron every other day. NOV 2023- Iron sat-35%; ferretin- 330-stable.   #CKD stage IV- GFR ~19; Continue enough hydration. Monitor closely. [s/p Dr.Korrapati Nephrology evaluation next week. 2023]-stable.   #Diabetes: patient OFF metformin;/glimepiride [as per nephrology]-stable.   # IV ACCESS: PIV.    # DISPOSITION: # proceed with [place] today # follow up in 1months - MD; labs- cbc/cmp;TSH; no treatment- non-contrast CT scan CAP prior- Dr.B

## 2023-01-13 LAB — T4: T4, Total: 5.6 ug/dL (ref 4.5–12.0)

## 2023-02-09 ENCOUNTER — Ambulatory Visit: Payer: Medicare Other

## 2023-02-10 ENCOUNTER — Ambulatory Visit
Admission: RE | Admit: 2023-02-10 | Discharge: 2023-02-10 | Disposition: A | Discharge status: Home / Self Care | Payer: Medicare Other | Source: Ambulatory Visit | Attending: Internal Medicine | Admitting: Internal Medicine

## 2023-02-10 DIAGNOSIS — N184 Chronic kidney disease, stage 4 (severe): Secondary | ICD-10-CM | POA: Diagnosis present

## 2023-02-10 DIAGNOSIS — C641 Malignant neoplasm of right kidney, except renal pelvis: Secondary | ICD-10-CM | POA: Insufficient documentation

## 2023-02-11 ENCOUNTER — Other Ambulatory Visit: Payer: Self-pay

## 2023-02-14 ENCOUNTER — Inpatient Hospital Stay: Payer: Medicare Other | Attending: Internal Medicine

## 2023-02-14 ENCOUNTER — Encounter: Payer: Self-pay | Admitting: Internal Medicine

## 2023-02-14 ENCOUNTER — Inpatient Hospital Stay (HOSPITAL_BASED_OUTPATIENT_CLINIC_OR_DEPARTMENT_OTHER): Payer: Medicare Other | Admitting: Internal Medicine

## 2023-02-14 VITALS — BP 156/63 | HR 66 | Temp 98.8°F | Resp 19 | Wt 204.4 lb

## 2023-02-14 DIAGNOSIS — Z9221 Personal history of antineoplastic chemotherapy: Secondary | ICD-10-CM | POA: Insufficient documentation

## 2023-02-14 DIAGNOSIS — Z7952 Long term (current) use of systemic steroids: Secondary | ICD-10-CM | POA: Insufficient documentation

## 2023-02-14 DIAGNOSIS — N184 Chronic kidney disease, stage 4 (severe): Secondary | ICD-10-CM | POA: Diagnosis not present

## 2023-02-14 DIAGNOSIS — Z90710 Acquired absence of both cervix and uterus: Secondary | ICD-10-CM | POA: Insufficient documentation

## 2023-02-14 DIAGNOSIS — C641 Malignant neoplasm of right kidney, except renal pelvis: Secondary | ICD-10-CM

## 2023-02-14 DIAGNOSIS — Z79899 Other long term (current) drug therapy: Secondary | ICD-10-CM | POA: Insufficient documentation

## 2023-02-14 DIAGNOSIS — E1122 Type 2 diabetes mellitus with diabetic chronic kidney disease: Secondary | ICD-10-CM | POA: Diagnosis not present

## 2023-02-14 DIAGNOSIS — I129 Hypertensive chronic kidney disease with stage 1 through stage 4 chronic kidney disease, or unspecified chronic kidney disease: Secondary | ICD-10-CM | POA: Diagnosis not present

## 2023-02-14 DIAGNOSIS — Z905 Acquired absence of kidney: Secondary | ICD-10-CM | POA: Insufficient documentation

## 2023-02-14 LAB — CBC WITH DIFFERENTIAL (CANCER CENTER ONLY)
Abs Immature Granulocytes: 0.07 10*3/uL (ref 0.00–0.07)
Basophils Absolute: 0 10*3/uL (ref 0.0–0.1)
Basophils Relative: 1 %
Eosinophils Absolute: 0.6 10*3/uL — ABNORMAL HIGH (ref 0.0–0.5)
Eosinophils Relative: 7 %
HCT: 37.9 % (ref 36.0–46.0)
Hemoglobin: 11.7 g/dL — ABNORMAL LOW (ref 12.0–15.0)
Immature Granulocytes: 1 %
Lymphocytes Relative: 13 %
Lymphs Abs: 1 10*3/uL (ref 0.7–4.0)
MCH: 29.8 pg (ref 26.0–34.0)
MCHC: 30.9 g/dL (ref 30.0–36.0)
MCV: 96.4 fL (ref 80.0–100.0)
Monocytes Absolute: 0.7 10*3/uL (ref 0.1–1.0)
Monocytes Relative: 9 %
Neutro Abs: 5.1 10*3/uL (ref 1.7–7.7)
Neutrophils Relative %: 69 %
Platelet Count: 186 10*3/uL (ref 150–400)
RBC: 3.93 MIL/uL (ref 3.87–5.11)
RDW: 13.6 % (ref 11.5–15.5)
WBC Count: 7.4 10*3/uL (ref 4.0–10.5)
nRBC: 0 % (ref 0.0–0.2)

## 2023-02-14 LAB — CMP (CANCER CENTER ONLY)
ALT: 11 U/L (ref 0–44)
AST: 13 U/L — ABNORMAL LOW (ref 15–41)
Albumin: 3.9 g/dL (ref 3.5–5.0)
Alkaline Phosphatase: 137 U/L — ABNORMAL HIGH (ref 38–126)
Anion gap: 6 (ref 5–15)
BUN: 50 mg/dL — ABNORMAL HIGH (ref 8–23)
CO2: 22 mmol/L (ref 22–32)
Calcium: 9 mg/dL (ref 8.9–10.3)
Chloride: 109 mmol/L (ref 98–111)
Creatinine: 2.15 mg/dL — ABNORMAL HIGH (ref 0.44–1.00)
GFR, Estimated: 23 mL/min — ABNORMAL LOW (ref >60)
Glucose, Bld: 129 mg/dL — ABNORMAL HIGH (ref 70–99)
Potassium: 4.8 mmol/L (ref 3.5–5.1)
Sodium: 137 mmol/L (ref 135–145)
Total Bilirubin: 0.4 mg/dL (ref 0.3–1.2)
Total Protein: 7.2 g/dL (ref 6.5–8.1)

## 2023-02-14 LAB — TSH: TSH: 2.677 u[IU]/mL (ref 0.350–4.500)

## 2023-02-14 MED ORDER — PREDNISONE 20 MG PO TABS: 20.0000 mg | ORAL_TABLET | Freq: Every day | ORAL | 0 refills | Status: DC

## 2023-02-14 MED ORDER — TRIAMCINOLONE ACETONIDE 0.5 % EX OINT: 1.0000 | TOPICAL_OINTMENT | Freq: Two times a day (BID) | CUTANEOUS | 1 refills | Status: DC

## 2023-02-14 MED ORDER — HYDROXYZINE HCL 10 MG PO TABS: 10.0000 mg | ORAL_TABLET | Freq: Every evening | ORAL | 1 refills | Status: DC | PRN

## 2023-02-14 NOTE — Progress Notes (Signed)
Patient states she is still have some itching from the [facility]. Patient states she has been itching for the last couple of months.

## 2023-02-14 NOTE — Assessment & Plan Note (Addendum)
#  Stage IIIa-clear-cell renal carcinoma-s/p  adjuvant [name] x 1 year [MAY 2024]. MAY 30th, 2024- CT CAP- non contrast- Status post interval right nephrectomy. No suspicious soft tissue in the nephrectomy bed or other noncontrast CT evidence of recurrent or metastatic disease in the chest, abdomen, or pelvis.  Will plan surveillance imaging every 6 months also.  # June 2024- CT Unchanged 0.4 cm nodule of the dependent right lower lobe, nonspecific and almost certainly benign incidental sequelae of prior infection or inflammation. Recommend follow up in 6 months.   # Skin rash-likely secondary to [name]-- continue allegra; cerave; vaseline; atarax prn. Continue kenalog. Add singulair. Worse/ not better- recommend starting  prednisone 20 mg/day; Take 1 pill a day for 2 weeks; do not stop until further directions.  Follow-up with APP in 2 weeks regarding symptom management.  # Intermittently- elevated calcium- 10.4; STOP vit D 50k; today calcium is 10.0.  Vit D levels- 69 [SEP 2023]- stable.    # Anemia: Hemoglobin around 11-12 overall stable/improving; on PO iron every other day. NOV 2023- Iron sat-35%; ferretin- 330-stable.   #CKD stage IV- GFR ~19; Continue enough hydration. Monitor closely. [s/p Dr.Korrapati Nephrology evaluation next week. 2023]-stable.   #Diabetes: patient OFF metformin;/glimepiride [as per nephrology]-monitor blood sugars closely at home.   #Incidental findings on Imaging  CT , 2024: 3. Trace bilateral pleural effusions and interlobular septal thickening at the lung bases, consistent with mild pulmonary edema [discussed with Dr.Korrapati/ [facility]- cards-[facility]]; Coronary artery disease; Aortic Atherosclerosis .I reviewed/discussed/counseled the patient.   # IV ACCESS: PIV.    # DISPOSITION: # follow up in APP- 2 weeks; labs-bmp-   Dr.B  # I reviewed the blood work- with the patient in detail; also reviewed the imaging independently [as summarized above]; and with the patient  in detail.

## 2023-02-14 NOTE — Progress Notes (Signed)
[facility] CONSULT NOTE  Patient Care Team: [name], MD as PCP - General (Internal Medicine) [name], MD as Consulting Physician (Oncology)  CHIEF COMPLAINTS/PURPOSE OF CONSULTATION: Kidney cancer    HISTORY OF PRESENTING ILLNESS: Ambulating independently.  Accompanied by husband.  Gabrielle Savage 77 y.o.  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  patient states she is still have some itching from the [facility]. Patient states she has been itching for the last couple of months.   Patient continues to complain of itching all over. The hydroxyzine makes her sleep longer.   Complains of shortness of breath on exertion.  Also difficult sleeping at night.  No worsening cough.  Positive for weight gain.  Mild swelling in the legs.  Denies any worsening joint pains.  No nausea no vomiting.   Review of Systems  Constitutional:  Positive for malaise/fatigue. Negative for chills and fever.  HENT:  Negative for nosebleeds and sore throat.   Eyes:  Negative for double vision.  Respiratory:  Negative for cough, hemoptysis, sputum production and wheezing.   Cardiovascular:  Negative for chest pain, palpitations, orthopnea and leg swelling.  Gastrointestinal:  Negative for abdominal pain, blood in stool, constipation, diarrhea, heartburn, melena, nausea and vomiting.  Genitourinary:  Negative for dysuria, frequency and urgency.  Musculoskeletal:  Positive for back pain and joint pain.  Skin:  Positive for itching and rash.  Neurological:  Negative for dizziness, tingling, focal weakness, weakness and headaches.  Endo/Heme/Allergies:  Does not bruise/bleed easily.  Psychiatric/Behavioral:  Negative for depression. The patient is not nervous/anxious and does not have insomnia.      MEDICAL HISTORY:  Past Medical History:  Diagnosis  Date   Aortic atherosclerosis (HCC)    Arthritis    B12 deficiency    Bradycardia    CAD (coronary artery disease)    Carotid atherosclerosis    CKD (chronic kidney disease), stage III (HCC)    Diastolic dysfunction 09/03/2021   a.)  TTE 09/03/2021: EF 63%; no RWMAs; LA mildly enlarged; trivial TR/PR, mild AR, moderate MR; G1DD.   DOE (dyspnea on exertion)    Fatigue    Gout    HLD (hyperlipidemia)    Hypertension    IDA (iron deficiency anemia)    Iliotibial band syndrome, left leg    MDD (major depressive disorder)    OAB (overactive bladder)    Obesity    PAC (premature atrial contraction)    noted on Holter   PONV (postoperative nausea and vomiting)    a.) single episode in 03/2013   PVC (premature ventricular contraction)    noted on Holter   Right kidney mass 10/27/2021   a.) CT 10/27/2021 -- mass measuring 8.3 x 6.8 x 8.1 cm   Sensorineural hearing loss (SNHL) of both ears    SUI (stress urinary incontinence, female)    SVT (supraventricular tachycardia)    noted on Holter   T2DM (type 2 diabetes mellitus) (HCC)    Uterovaginal prolapse    Vitamin D deficiency    Voiding dysfunction     SURGICAL HISTORY: Past Surgical History:  Procedure Laterality Date   ABDOMINAL HYSTERECTOMY     partial   BLADDER SUSPENSION     CHOLECYSTECTOMY     COLONOSCOPY N/A 07/25/2017   Procedure: COLONOSCOPY;  Surgeon: Christena Deem, MD;  Location: [facility] ENDOSCOPY;  Service: Endoscopy;  Laterality: N/A;   COLONOSCOPY WITH PROPOFOL N/A 10/19/2021   Procedure: COLONOSCOPY WITH PROPOFOL;  Surgeon: Regis Bill, MD;  Location: [facility] ENDOSCOPY;  Service: Endoscopy;  Laterality: N/A;  DM   ESOPHAGOGASTRODUODENOSCOPY (EGD) WITH PROPOFOL N/A 10/19/2021   Procedure: ESOPHAGOGASTRODUODENOSCOPY (EGD) WITH PROPOFOL;  Surgeon: Regis Bill, MD;  Location: [facility] ENDOSCOPY;  Service: Endoscopy;  Laterality: N/A;   JOINT REPLACEMENT Bilateral    PARTIAL KNEE REPLACEMENT   KNEE  SURGERY     LAPAROSCOPIC NEPHRECTOMY, HAND ASSISTED Right 11/20/2021   Procedure: HAND ASSISTED LAPAROSCOPIC RADICAL NEPHRECTOMY;  Surgeon: Sondra Come, MD;  Location: [facility] ORS;  Service: Urology;  Laterality: Right;   PARATHYROIDECTOMY      SOCIAL HISTORY: Social History   Socioeconomic History   Marital status: Married    Spouse name: Not on file   Number of children: Not on file   Years of education: Not on file   Highest education level: Not on file  Occupational History   Not on file  Tobacco Use   Smoking status: Never    Passive exposure: Never   Smokeless tobacco: Never  Vaping Use   Vaping Use: Never used  Substance and Sexual Activity   Alcohol use: Not Currently    Comment: occ   Drug use: No   Sexual activity: Not Currently  Other Topics Concern   Not on file  Social History Narrative   [place] in [place]; with husband; 3 biological children [[place]; [place]; [place]]. Never smoked; glass of wine rare. 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    Social Determinants of Health   Financial Resource Strain: Low Risk  (12/07/2021)   Overall Financial Resource Strain (CARDIA)    Difficulty of Paying Living Expenses: Not hard at all  Food Insecurity: No Food Insecurity (12/07/2021)   Hunger Vital Sign    Worried About Running Out of Food in the Last Year: Never true    Ran Out of Food in the Last Year: Never true  Transportation Needs: No Transportation Needs (12/07/2021)   PRAPARE - Administrator, Civil Service (Medical): No    Lack of Transportation (Non-Medical): No  Physical Activity: Insufficiently Active (12/07/2021)   Exercise Vital Sign    Days of Exercise per Week: 2 days    Minutes of Exercise per Session: 30 min  Stress: No Stress Concern Present (12/07/2021)   [employer] of Occupational Health - Occupational Stress Questionnaire    Feeling of Stress : Only a little  Social Connections: Moderately Integrated  (12/07/2021)   Social Connection and Isolation Panel [NHANES]    Frequency of Communication with Friends and Family: Three times a week    Frequency of Social Gatherings with Friends and Family: Three times a week    Attends Religious Services: 1 to 4 times per year    Active Member of Clubs or Organizations: No    Attends Banker Meetings: Never    Marital Status: Married  Catering manager Violence: Not At Risk (12/07/2021)   Humiliation, Afraid, Rape, and Kick questionnaire    Fear of Current or Ex-Partner: No    Emotionally Abused: No    Physically Abused: No    Sexually Abused: No    FAMILY HISTORY: Family History  Problem Relation Age of Onset   Heart failure Mother    Stroke Father    Lung cancer Father    Breast cancer Neg Hx     ALLERGIES:  is allergic  to codeine, nsaids, and oxycodone.  MEDICATIONS:  Current Outpatient Medications  Medication Sig Dispense Refill   allopurinol (ZYLOPRIM) 100 MG tablet Take 300 mg by mouth every morning. Pt has decreased to 100mg      amLODipine (NORVASC) 5 MG tablet Take 5 mg by mouth every morning.     citalopram (CELEXA) 10 MG tablet Take 10 mg by mouth every morning.     ferrous sulfate 325 (65 FE) MG tablet Take 325 mg by mouth every other day.     irbesartan (AVAPRO) 150 MG tablet Take 150 mg by mouth every morning.     montelukast (SINGULAIR) 10 MG tablet Take 1 tablet (10 mg total) by mouth at bedtime. 30 tablet 1   predniSONE (DELTASONE) 20 MG tablet Take 1 tablet (20 mg total) by mouth daily with breakfast. Take 1 pill a day for 2 weeks; do not stop until further directions. once a day with food in AM> 40 tablet 0   vitamin B-12 (CYANOCOBALAMIN) 1000 MCG tablet Take 1,000 mcg by mouth daily.     docusate sodium (COLACE) 100 MG capsule Take 1 capsule (100 mg total) by mouth 2 (two) times daily. (Patient not taking: Reported on 02/14/2023) 20 capsule 0   hydrOXYzine (ATARAX) 10 MG tablet Take 1 tablet (10 mg total) by  mouth at bedtime as needed. For itching. 60 tablet 1   triamcinolone ointment (KENALOG) 0.5 % Apply 1 Application topically 2 (two) times daily. 80 g 1   No current facility-administered medications for this visit.      Marland Kitchen  PHYSICAL EXAMINATION:  Vitals:   02/14/23 1433  BP: (!) 156/63  Pulse: 66  Resp: 19  Temp: 98.8 F (37.1 C)  SpO2: 96%    Filed Weights   02/14/23 1433  Weight: 204 lb 6.4 oz (92.7 kg)    Mild maculopapular rash noted in the back and also front.   Physical Exam Vitals and nursing note reviewed.  HENT:     Head: Normocephalic and atraumatic.     Mouth/Throat:     Pharynx: Oropharynx is clear.  Eyes:     Extraocular Movements: Extraocular movements intact.     Pupils: Pupils are equal, round, and reactive to light.  Cardiovascular:     Rate and Rhythm: Normal rate and regular rhythm.  Pulmonary:     Comments: Decreased breath sounds bilaterally.  Abdominal:     Palpations: Abdomen is soft.  Musculoskeletal:        General: Normal range of motion.     Cervical back: Normal range of motion.  Skin:    General: Skin is warm.  Neurological:     General: No focal deficit present.     Mental Status: She is alert and oriented to person, place, and time.  Psychiatric:        Behavior: Behavior normal.        Judgment: Judgment normal.      LABORATORY DATA:  I have reviewed the data as listed Lab Results  Component Value Date   WBC 7.4 02/14/2023   HGB 11.7 (L) 02/14/2023   HCT 37.9 02/14/2023   MCV 96.4 02/14/2023   PLT 186 02/14/2023   Recent Labs    12/21/22 0832 01/11/23 0947 02/14/23 1423  NA 141 137 137  K 4.8 4.3 4.8  CL 114* 110 109  CO2 21* 20* 22  GLUCOSE 134* 121* 129*  BUN 51* 46* 50*  CREATININE 2.15* 2.51* 2.15*  CALCIUM 9.3 9.0 9.0  GFRNONAA 23* 19* 23*  PROT 7.1 6.8 7.2  ALBUMIN 4.1 4.0 3.9  AST 13* 16 13*  ALT 13 15 11   ALKPHOS 123 128* 137*  BILITOT 0.5 0.2* 0.4    RADIOGRAPHIC STUDIES: I have  personally reviewed the radiological images as listed and agreed with the findings in the report. CT CHEST WO CONTRAST  Result Date: 02/14/2023 CLINICAL DATA:  Metastatic renal cell carcinoma * Tracking Code: BO * EXAM: CT CHEST, ABDOMEN AND PELVIS WITHOUT CONTRAST TECHNIQUE: Multidetector CT imaging of the chest, abdomen and pelvis was performed following the standard protocol without IV contrast. RADIATION DOSE REDUCTION: This exam was performed according to the departmental dose-optimization program which includes automated exposure control, adjustment of the mA and/or kV according to patient size and/or use of iterative reconstruction technique. COMPARISON:  CT chest, 10/29/2021, CT abdomen pelvis, 10/27/2021 FINDINGS: CT CHEST FINDINGS Cardiovascular: Aortic atherosclerosis. Normal heart size. Three-vessel coronary artery calcifications. Trace pericardial effusion. Mediastinum/Nodes: No enlarged mediastinal, hilar, or axillary lymph nodes. Thyroid gland, trachea, and esophagus demonstrate no significant findings. Lungs/Pleura: Unchanged 0.4 cm nodule of the dependent right lower lobe (series 506, image 75). Trace bilateral pleural effusions and interlobular septal thickening at the lung bases. Musculoskeletal: No chest wall abnormality. No acute osseous findings. Disc degenerative disease and bridging osteophytosis throughout the thoracic and lumbar spine, in keeping with DISH. CT ABDOMEN PELVIS FINDINGS Hepatobiliary: No solid liver abnormality is seen. Benign cyst or hemangioma of the peripheral liver dome, for which no further follow-up or characterization is required (series 504, image 39). Status post cholecystectomy. Mild postoperative biliary dilatation. Pancreas: Unremarkable. No pancreatic ductal dilatation or surrounding inflammatory changes. Spleen: Normal in size without significant abnormality. Adrenals/Urinary Tract: Adrenal glands are unremarkable. Status post interval right nephrectomy. No  suspicious soft tissue in the nephrectomy bed. Simple, benign left renal cortical cysts, for which no further follow-up or characterization is required. Left kidney is otherwise normal, without renal calculi, solid lesion, or hydronephrosis. Bladder is unremarkable. Stomach/Bowel: Stomach is within normal limits. Appendix appears normal. No evidence of bowel wall thickening, distention, or inflammatory changes. Descending and sigmoid diverticulosis. Vascular/Lymphatic: Aortic atherosclerosis. No enlarged abdominal or pelvic lymph nodes. Reproductive: Status post hysterectomy. Other: No abdominal wall hernia or abnormality. No ascites. Musculoskeletal: No acute osseous findings. IMPRESSION: 1. Status post interval right nephrectomy. No suspicious soft tissue in the nephrectomy bed or other noncontrast CT evidence of recurrent or metastatic disease in the chest, abdomen, or pelvis. 2. Unchanged 0.4 cm nodule of the dependent right lower lobe, nonspecific and almost certainly benign incidental sequelae of prior infection or inflammation. Attention on follow-up. 3. Trace bilateral pleural effusions and interlobular septal thickening at the lung bases, consistent with mild pulmonary edema. 4. Coronary artery disease. Aortic Atherosclerosis (ICD10-I70.0). Electronically Signed   By: Jearld Lesch M.D.   On: 02/14/2023 14:44   CT Abdomen Pelvis Wo Contrast  Result Date: 02/14/2023 CLINICAL DATA:  Metastatic renal cell carcinoma * Tracking Code: BO * EXAM: CT CHEST, ABDOMEN AND PELVIS WITHOUT CONTRAST TECHNIQUE: Multidetector CT imaging of the chest, abdomen and pelvis was performed following the standard protocol without IV contrast. RADIATION DOSE REDUCTION: This exam was performed according to the departmental dose-optimization program which includes automated exposure control, adjustment of the mA and/or kV according to patient size and/or use of iterative reconstruction technique. COMPARISON:  CT chest, 10/29/2021,  CT abdomen pelvis, 10/27/2021 FINDINGS: CT CHEST FINDINGS Cardiovascular: Aortic atherosclerosis. Normal heart size. Three-vessel coronary artery calcifications. Trace pericardial effusion. Mediastinum/Nodes:  No enlarged mediastinal, hilar, or axillary lymph nodes. Thyroid gland, trachea, and esophagus demonstrate no significant findings. Lungs/Pleura: Unchanged 0.4 cm nodule of the dependent right lower lobe (series 506, image 75). Trace bilateral pleural effusions and interlobular septal thickening at the lung bases. Musculoskeletal: No chest wall abnormality. No acute osseous findings. Disc degenerative disease and bridging osteophytosis throughout the thoracic and lumbar spine, in keeping with DISH. CT ABDOMEN PELVIS FINDINGS Hepatobiliary: No solid liver abnormality is seen. Benign cyst or hemangioma of the peripheral liver dome, for which no further follow-up or characterization is required (series 504, image 39). Status post cholecystectomy. Mild postoperative biliary dilatation. Pancreas: Unremarkable. No pancreatic ductal dilatation or surrounding inflammatory changes. Spleen: Normal in size without significant abnormality. Adrenals/Urinary Tract: Adrenal glands are unremarkable. Status post interval right nephrectomy. No suspicious soft tissue in the nephrectomy bed. Simple, benign left renal cortical cysts, for which no further follow-up or characterization is required. Left kidney is otherwise normal, without renal calculi, solid lesion, or hydronephrosis. Bladder is unremarkable. Stomach/Bowel: Stomach is within normal limits. Appendix appears normal. No evidence of bowel wall thickening, distention, or inflammatory changes. Descending and sigmoid diverticulosis. Vascular/Lymphatic: Aortic atherosclerosis. No enlarged abdominal or pelvic lymph nodes. Reproductive: Status post hysterectomy. Other: No abdominal wall hernia or abnormality. No ascites. Musculoskeletal: No acute osseous findings. IMPRESSION:  1. Status post interval right nephrectomy. No suspicious soft tissue in the nephrectomy bed or other noncontrast CT evidence of recurrent or metastatic disease in the chest, abdomen, or pelvis. 2. Unchanged 0.4 cm nodule of the dependent right lower lobe, nonspecific and almost certainly benign incidental sequelae of prior infection or inflammation. Attention on follow-up. 3. Trace bilateral pleural effusions and interlobular septal thickening at the lung bases, consistent with mild pulmonary edema. 4. Coronary artery disease. Aortic Atherosclerosis (ICD10-I70.0). Electronically Signed   By: Jearld Lesch M.D.   On: 02/14/2023 14:44    Clear cell carcinoma of kidney, right (HCC) #Stage IIIa-clear-cell renal carcinoma-s/p  adjuvant [name] x 1 year [MAY 2024]. MAY 30th, 2024- CT CAP- non contrast- Status post interval right nephrectomy. No suspicious soft tissue in the nephrectomy bed or other noncontrast CT evidence of recurrent or metastatic disease in the chest, abdomen, or pelvis.  Will plan surveillance imaging every 6 months also.  # June 2024- CT Unchanged 0.4 cm nodule of the dependent right lower lobe, nonspecific and almost certainly benign incidental sequelae of prior infection or inflammation. Recommend follow up in 6 months.   # Skin rash-likely secondary to [name]-- continue allegra; cerave; vaseline; atarax prn. Continue kenalog. Add singulair. Worse/ not better- recommend starting  prednisone 20 mg/day; Take 1 pill a day for 2 weeks; do not stop until further directions.  Follow-up with APP in 2 weeks regarding symptom management.  # Intermittently- elevated calcium- 10.4; STOP vit D 50k; today calcium is 10.0.  Vit D levels- 69 [SEP 2023]- stable.    # Anemia: Hemoglobin around 11-12 overall stable/improving; on PO iron every other day. NOV 2023- Iron sat-35%; ferretin- 330-stable.   #CKD stage IV- GFR ~19; Continue enough hydration. Monitor closely. [s/p Dr.Korrapati [facility]  evaluation next week. 2023]-stable.   #Diabetes: patient OFF metformin;/glimepiride [as per nephrology]-monitor blood sugars closely at home.   #Incidental findings on Imaging  CT , 2024: 3. Trace bilateral pleural effusions and interlobular septal thickening at the lung bases, consistent with mild pulmonary edema [discussed with Dr.Korrapati/ [facility]- cards-Amanda]; Coronary artery disease; Aortic Atherosclerosis .I reviewed/discussed/counseled the patient.   # IV ACCESS: PIV.    #  DISPOSITION: # follow up in APP- 2 weeks; labs-bmp-   Dr.B  # I reviewed the blood work- with the patient in detail; also reviewed the imaging independently [as summarized above]; and with the patient in detail.    All questions were answered. The patient knows to call the clinic with any problems, questions or concerns.   [name], MD 02/14/2023 3:19 PM

## 2023-02-28 ENCOUNTER — Encounter: Payer: Self-pay | Admitting: Nurse Practitioner

## 2023-02-28 ENCOUNTER — Inpatient Hospital Stay: Payer: Medicare Other

## 2023-02-28 ENCOUNTER — Inpatient Hospital Stay (HOSPITAL_BASED_OUTPATIENT_CLINIC_OR_DEPARTMENT_OTHER): Payer: Medicare Other | Admitting: Nurse Practitioner

## 2023-02-28 VITALS — BP 145/61 | HR 59 | Temp 97.8°F | Resp 16 | Ht 61.0 in | Wt 200.0 lb

## 2023-02-28 DIAGNOSIS — C641 Malignant neoplasm of right kidney, except renal pelvis: Secondary | ICD-10-CM | POA: Diagnosis not present

## 2023-02-28 DIAGNOSIS — R21 Rash and other nonspecific skin eruption: Secondary | ICD-10-CM

## 2023-02-28 DIAGNOSIS — T50905D Adverse effect of unspecified drugs, medicaments and biological substances, subsequent encounter: Secondary | ICD-10-CM | POA: Diagnosis not present

## 2023-02-28 LAB — BASIC METABOLIC PANEL
Anion gap: 9 (ref 5–15)
BUN: 72 mg/dL — ABNORMAL HIGH (ref 8–23)
CO2: 20 mmol/L — ABNORMAL LOW (ref 22–32)
Calcium: 8.6 mg/dL — ABNORMAL LOW (ref 8.9–10.3)
Chloride: 110 mmol/L (ref 98–111)
Creatinine, Ser: 2.31 mg/dL — ABNORMAL HIGH (ref 0.44–1.00)
GFR, Estimated: 22 mL/min — ABNORMAL LOW (ref >60)
Glucose, Bld: 149 mg/dL — ABNORMAL HIGH (ref 70–99)
Potassium: 4.5 mmol/L (ref 3.5–5.1)
Sodium: 139 mmol/L (ref 135–145)

## 2023-02-28 MED ORDER — PREDNISONE 10 MG PO TABS: ORAL_TABLET | ORAL | 0 refills | Status: AC

## 2023-02-28 NOTE — Progress Notes (Signed)
[facility] CONSULT NOTE  Patient Care Team: [name], MD as PCP - General (Internal Medicine) [name], MD as Consulting Physician (Oncology)  CHIEF COMPLAINTS/PURPOSE OF CONSULTATION: Kidney cancer  HISTORY OF PRESENTING ILLNESS: Ambulating independently.  Accompanied by husband.  Gabrielle Savage 77 y.o. 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 She had started prednisone 20 mg at last visit. Rash if much better. Less itching and nearly resolved.    Review of Systems  Constitutional:  Positive for malaise/fatigue. Negative for chills and fever.  HENT:  Negative for nosebleeds and sore throat.   Eyes:  Negative for double vision.  Respiratory:  Negative for cough, hemoptysis, sputum production and wheezing.   Cardiovascular:  Negative for chest pain, palpitations, orthopnea and leg swelling.  Gastrointestinal:  Negative for abdominal pain, blood in stool, constipation, diarrhea, heartburn, melena, nausea and vomiting.  Genitourinary:  Negative for dysuria, frequency and urgency.  Musculoskeletal:  Negative for back pain and joint pain.  Skin:  Positive for rash. Negative for itching.  Neurological:  Negative for dizziness, tingling, focal weakness, weakness and headaches.  Endo/Heme/Allergies:  Does not bruise/bleed easily.  Psychiatric/Behavioral:  Negative for depression. The patient is not nervous/anxious and does not have insomnia.      MEDICAL HISTORY:  Past Medical History:  Diagnosis Date   Aortic atherosclerosis (HCC)    Arthritis    B12 deficiency    Bradycardia    CAD (coronary artery disease)    Carotid atherosclerosis    CKD (chronic kidney disease), stage III (HCC)    Diastolic dysfunction 09/03/2021   a.)  TTE 09/03/2021: EF 63%; no RWMAs; LA mildly enlarged; trivial TR/PR, mild AR, moderate MR; G1DD.    DOE (dyspnea on exertion)    Fatigue    Gout    HLD (hyperlipidemia)    Hypertension    IDA (iron deficiency anemia)    Iliotibial band syndrome, left leg    MDD (major depressive disorder)    OAB (overactive bladder)    Obesity    PAC (premature atrial contraction)    noted on Holter   PONV (postoperative nausea and vomiting)    a.) single episode in 03/2013   PVC (premature ventricular contraction)    noted on Holter   Right kidney mass 10/27/2021   a.) CT 10/27/2021 -- mass measuring 8.3 x 6.8 x 8.1 cm   Sensorineural hearing loss (SNHL) of both ears    SUI (stress urinary incontinence, female)    SVT (supraventricular tachycardia)    noted on Holter   T2DM (type 2 diabetes mellitus) (HCC)    Uterovaginal prolapse    Vitamin D deficiency    Voiding dysfunction     SURGICAL HISTORY: Past Surgical History:  Procedure Laterality Date   ABDOMINAL HYSTERECTOMY     partial   BLADDER SUSPENSION     CHOLECYSTECTOMY     COLONOSCOPY N/A 07/25/2017   Procedure: COLONOSCOPY;  Surgeon: Christena Deem, MD;  Location: [facility] ENDOSCOPY;  Service: Endoscopy;  Laterality: N/A;   COLONOSCOPY WITH PROPOFOL N/A 10/19/2021   Procedure: COLONOSCOPY WITH PROPOFOL;  Surgeon: Regis Bill, MD;  Location: [facility] ENDOSCOPY;  Service: Endoscopy;  Laterality: N/A;  DM   ESOPHAGOGASTRODUODENOSCOPY (EGD) WITH PROPOFOL N/A 10/19/2021   Procedure: ESOPHAGOGASTRODUODENOSCOPY (EGD) WITH PROPOFOL;  Surgeon: Regis Bill, MD;  Location: [facility] ENDOSCOPY;  Service: Endoscopy;  Laterality: N/A;   JOINT REPLACEMENT Bilateral    PARTIAL KNEE REPLACEMENT   KNEE SURGERY     LAPAROSCOPIC NEPHRECTOMY, HAND ASSISTED Right 11/20/2021   Procedure: HAND ASSISTED LAPAROSCOPIC RADICAL NEPHRECTOMY;  Surgeon: Sondra Come, MD;  Location: [facility] ORS;  Service: Urology;  Laterality: Right;   PARATHYROIDECTOMY      SOCIAL HISTORY: Social History   Socioeconomic History   Marital status: Married     Spouse name: Not on file   Number of children: Not on file   Years of education: Not on file   Highest education level: Not on file  Occupational History   Not on file  Tobacco Use   Smoking status: Never    Passive exposure: Never   Smokeless tobacco: Never  Vaping Use   Vaping Use: Never used  Substance and Sexual Activity   Alcohol use: Not Currently    Comment: occ   Drug use: No   Sexual activity: Not Currently  Other Topics Concern   Not on file  Social History Narrative   Lives in [place]; with husband; 3 biological children [[place]; [place]; [place]]. Never smoked; glass of wine rare. 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    Social Determinants of Health   Financial Resource Strain: Low Risk  (12/07/2021)   Overall Financial Resource Strain (CARDIA)    Difficulty of Paying Living Expenses: Not hard at all  Food Insecurity: No Food Insecurity (12/07/2021)   Hunger Vital Sign    Worried About Running Out of Food in the Last Year: Never true    Ran Out of Food in the Last Year: Never true  Transportation Needs: No Transportation Needs (12/07/2021)   PRAPARE - Administrator, Civil Service (Medical): No    Lack of Transportation (Non-Medical): No  Physical Activity: Insufficiently Active (12/07/2021)   Exercise Vital Sign    Days of Exercise per Week: 2 days    Minutes of Exercise per Session: 30 min  Stress: No Stress Concern Present (12/07/2021)   [employer] of Occupational Health - Occupational Stress Questionnaire    Feeling of Stress : Only a little  Social Connections: Moderately Integrated (12/07/2021)   Social Connection and Isolation Panel [NHANES]    Frequency of Communication with Friends and Family: Three times a week    Frequency of Social Gatherings with Friends and Family: Three times a week    Attends Religious Services: 1 to 4 times per year    Active Member of Clubs or Organizations: No    Attends Tax inspector Meetings: Never    Marital Status: Married  Catering manager Violence: Not At Risk (12/07/2021)   Humiliation, Afraid, Rape, and Kick questionnaire    Fear of Current or Ex-Partner: No    Emotionally Abused: No    Physically Abused: No    Sexually Abused: No    FAMILY HISTORY: Family History  Problem Relation Age of Onset   Heart failure Mother    Stroke Father    Lung cancer Father    Breast cancer Neg Hx     ALLERGIES:  is allergic to codeine, nsaids, and oxycodone.  MEDICATIONS:  Current Outpatient Medications  Medication Sig Dispense Refill   allopurinol (ZYLOPRIM) 100 MG tablet Take 300 mg by mouth every morning. Pt has decreased to 100mg      amLODipine (NORVASC) 5 MG tablet Take 5 mg by mouth every morning.     citalopram (CELEXA) 10 MG tablet Take 10  mg by mouth every morning.     ferrous sulfate 325 (65 FE) MG tablet Take 325 mg by mouth every other day.     hydrOXYzine (ATARAX) 10 MG tablet Take 1 tablet (10 mg total) by mouth at bedtime as needed. For itching. 60 tablet 1   irbesartan (AVAPRO) 150 MG tablet Take 150 mg by mouth every morning.     montelukast (SINGULAIR) 10 MG tablet Take 1 tablet (10 mg total) by mouth at bedtime. 30 tablet 1   predniSONE (DELTASONE) 20 MG tablet Take 1 tablet (20 mg total) by mouth daily with breakfast. Take 1 pill a day for 2 weeks; do not stop until further directions. once a day with food in AM> 40 tablet 0   triamcinolone ointment (KENALOG) 0.5 % Apply 1 Application topically 2 (two) times daily. 80 g 1   vitamin B-12 (CYANOCOBALAMIN) 1000 MCG tablet Take 1,000 mcg by mouth daily.     docusate sodium (COLACE) 100 MG capsule Take 1 capsule (100 mg total) by mouth 2 (two) times daily. (Patient not taking: Reported on 02/14/2023) 20 capsule 0   No current facility-administered medications for this visit.    PHYSICAL EXAMINATION: Vitals:   02/28/23 1043  BP: (!) 145/61  Pulse: (!) 59  Resp: 16  Temp: 97.8 F (36.6 C)   SpO2: 98%   Filed Weights   02/28/23 1043  Weight: 200 lb (90.7 kg)     Physical Exam Constitutional:      Appearance: She is not ill-appearing.  Skin:    General: Skin is warm.     Comments: Mild maculopapular rash scattered across low back, rare abdomen, and rare arm lesions.   Neurological:     Mental Status: She is alert and oriented to person, place, and time.  Psychiatric:        Mood and Affect: Mood normal.        Behavior: Behavior normal.    LABORATORY DATA:  I have reviewed the data as listed Lab Results  Component Value Date   WBC 7.4 02/14/2023   HGB 11.7 (L) 02/14/2023   HCT 37.9 02/14/2023   MCV 96.4 02/14/2023   PLT 186 02/14/2023   Recent Labs    12/21/22 0832 01/11/23 0947 02/14/23 1423 02/28/23 1035  NA 141 137 137 139  K 4.8 4.3 4.8 4.5  CL 114* 110 109 110  CO2 21* 20* 22 20*  GLUCOSE 134* 121* 129* 149*  BUN 51* 46* 50* 72*  CREATININE 2.15* 2.51* 2.15* 2.31*  CALCIUM 9.3 9.0 9.0 8.6*  GFRNONAA 23* 19* 23* 22*  PROT 7.1 6.8 7.2  --   ALBUMIN 4.1 4.0 3.9  --   AST 13* 16 13*  --   ALT 13 15 11   --   ALKPHOS 123 128* 137*  --   BILITOT 0.5 0.2* 0.4  --      RADIOGRAPHIC STUDIES:   Assessment & Plan:  No problem-specific Assessment & Plan notes found for this encounter.  # Skin rash- likely secondary to [name]-- continue allegra; cerave; vaseline; atarax prn. Continue kenalog. Continue singulair. Improved with prednisone 20 mg/day. Improving. Plan to wean to lowest tolerated dose vs taper off. Start 10 mg daily x 7 days then 5 mg daily x 7 days then 5 mg every other day until stopped. If symptoms recur, notify clinic.   # Stage IIIa-clear-cell renal carcinoma-s/p  adjuvant [name] x 1 year [MAY 2024]. MAY 30th, 2024- CT CAP- non contrast- Status  post interval right nephrectomy. No suspicious soft tissue in the nephrectomy bed or other noncontrast CT evidence of recurrent or metastatic disease in the chest, abdomen, or pelvis.     # June 2024- CT Unchanged 0.4 cm nodule of the dependent right lower lobe, nonspecific and almost certainly benign incidental sequelae of prior infection or inflammation. Recommend follow up in 6 months.     # Diabetes: patient OFF metformin;/glimepiride [as per nephrology]-monitor blood sugars closely at home in setting of steroids.     # IV ACCESS: PIV   # DISPOSITION: 6-8 weeks- lab (cbc, cmp), Dr Donneta Romberg  All questions were answered. The patient knows to call the clinic with any problems, questions or concerns.  Alinda Dooms, NP 02/28/2023

## 2023-03-11 ENCOUNTER — Other Ambulatory Visit: Payer: Self-pay | Admitting: Internal Medicine

## 2023-03-23 ENCOUNTER — Other Ambulatory Visit: Payer: Self-pay

## 2023-03-31 ENCOUNTER — Encounter: Payer: Self-pay | Admitting: Hospice and Palliative Medicine

## 2023-03-31 ENCOUNTER — Telehealth: Payer: Self-pay | Admitting: *Deleted

## 2023-03-31 ENCOUNTER — Inpatient Hospital Stay: Payer: Medicare Other | Attending: Internal Medicine | Admitting: Hospice and Palliative Medicine

## 2023-03-31 DIAGNOSIS — R21 Rash and other nonspecific skin eruption: Secondary | ICD-10-CM

## 2023-03-31 MED ORDER — PREDNISONE 20 MG PO TABS: ORAL_TABLET | ORAL | 0 refills | Status: DC

## 2023-03-31 NOTE — Progress Notes (Signed)
Virtual Visit via virtual note  I connected with Gabrielle Savage on 03/31/23 at  3:40 PM EDT by telemedicine and verified that I am speaking with the correct person using two identifiers.  Location: Patient: Home Provider: Clinic   I discussed the limitations, risks, security and privacy concerns of performing an evaluation and management service by telephone and the availability of in person appointments. I also discussed with the patient that there may be a patient responsible charge related to this service. The patient expressed understanding and agreed to proceed.   History of Present Illness: Gabrielle Savage is a 77 year old woman with multiple medical problems including stage III-IV renal cell carcinoma on adjuvant [name].  Patient requested virtual visit today for evaluation of recurrent rash.   Observations/Objective: Patient seen via MyChart visit.  Patient previously evaluated for pruritic rash by Dr. Donneta Romberg on 6/3 and Consuello Masse, NP on 6/17.  Rash was thought to be secondary to [facility] which was held.  Patient was started on prednisone and had significant improvement but rashes recurred for 1 week after discontinuation of prednisone.  Patient describes rash as intensely pruritic and unrelieved with hydroxyzine, Kenalog, and Singulair.  Rash started on back and spread to chest and scalp.  She denies any other symptomatic complaints or concerns today.  Assessment and Plan: Rash -rash difficult to assess virtually but she does appear to have erythematous lesions on back.  Likely secondary to [facility] given history.  Patient previously responded well to long prednisone taper but rash recurred upon discontinuation.  Discussed with Dr. Andrew Au and will restart patient on prednisone 40 mg x 1 week, then 20 mg x 1 week, then 10 mg x 1 week.  Will also send referral to dermatology.  Patient has established dermatology appointment in October but we will see if patient can be seen  sooner.  Patient has follow-up with Dr. Donneta Romberg in 3 weeks  Follow Up Instructions: RTC on 8/5 to see Dr. Donneta Romberg   I discussed the assessment and treatment plan with the patient. The patient was provided an opportunity to ask questions and all were answered. The patient agreed with the plan and demonstrated an understanding of the instructions.   The patient was advised to call back or seek an in-person evaluation if the symptoms worsen or if the condition fails to improve as anticipated.  I provided 15 minutes of non-face-to-face time during this encounter.   Malachy Moan, NP

## 2023-03-31 NOTE — Telephone Encounter (Signed)
Virtual visit scheduled with Gabrielle Savage today at 3:40 pm.

## 2023-03-31 NOTE — Telephone Encounter (Signed)
Patient called reporting that she has itching from [facility] and that she had been on Prednisone and is now off of it and the itching which is all over her body is worse and she is miserable and needs something done for it. Please advise

## 2023-04-13 ENCOUNTER — Other Ambulatory Visit: Payer: Self-pay | Admitting: Internal Medicine

## 2023-04-15 ENCOUNTER — Other Ambulatory Visit: Payer: Self-pay | Admitting: *Deleted

## 2023-04-15 DIAGNOSIS — C641 Malignant neoplasm of right kidney, except renal pelvis: Secondary | ICD-10-CM

## 2023-04-18 ENCOUNTER — Encounter: Payer: Self-pay | Admitting: Internal Medicine

## 2023-04-18 ENCOUNTER — Inpatient Hospital Stay: Payer: Medicare Other | Admitting: Internal Medicine

## 2023-04-18 ENCOUNTER — Inpatient Hospital Stay: Payer: Medicare Other | Attending: Internal Medicine

## 2023-04-18 VITALS — BP 145/65 | HR 68 | Temp 99.3°F | Ht 61.0 in | Wt 209.4 lb

## 2023-04-18 DIAGNOSIS — Z79899 Other long term (current) drug therapy: Secondary | ICD-10-CM | POA: Insufficient documentation

## 2023-04-18 DIAGNOSIS — I129 Hypertensive chronic kidney disease with stage 1 through stage 4 chronic kidney disease, or unspecified chronic kidney disease: Secondary | ICD-10-CM | POA: Insufficient documentation

## 2023-04-18 DIAGNOSIS — R21 Rash and other nonspecific skin eruption: Secondary | ICD-10-CM | POA: Insufficient documentation

## 2023-04-18 DIAGNOSIS — C641 Malignant neoplasm of right kidney, except renal pelvis: Secondary | ICD-10-CM | POA: Insufficient documentation

## 2023-04-18 DIAGNOSIS — Z905 Acquired absence of kidney: Secondary | ICD-10-CM | POA: Diagnosis not present

## 2023-04-18 DIAGNOSIS — E1122 Type 2 diabetes mellitus with diabetic chronic kidney disease: Secondary | ICD-10-CM | POA: Insufficient documentation

## 2023-04-18 DIAGNOSIS — N183 Chronic kidney disease, stage 3 unspecified: Secondary | ICD-10-CM | POA: Insufficient documentation

## 2023-04-18 DIAGNOSIS — Z801 Family history of malignant neoplasm of trachea, bronchus and lung: Secondary | ICD-10-CM | POA: Insufficient documentation

## 2023-04-18 LAB — CMP (CANCER CENTER ONLY)
ALT: 16 U/L (ref 0–44)
AST: 11 U/L — ABNORMAL LOW (ref 15–41)
Albumin: 3.9 g/dL (ref 3.5–5.0)
Alkaline Phosphatase: 102 U/L (ref 38–126)
Anion gap: 8 (ref 5–15)
BUN: 61 mg/dL — ABNORMAL HIGH (ref 8–23)
CO2: 17 mmol/L — ABNORMAL LOW (ref 22–32)
Calcium: 8.8 mg/dL — ABNORMAL LOW (ref 8.9–10.3)
Chloride: 114 mmol/L — ABNORMAL HIGH (ref 98–111)
Creatinine: 2.11 mg/dL — ABNORMAL HIGH (ref 0.44–1.00)
GFR, Estimated: 24 mL/min — ABNORMAL LOW (ref >60)
Glucose, Bld: 140 mg/dL — ABNORMAL HIGH (ref 70–99)
Potassium: 4.6 mmol/L (ref 3.5–5.1)
Sodium: 139 mmol/L (ref 135–145)
Total Bilirubin: 0.5 mg/dL (ref 0.3–1.2)
Total Protein: 6.5 g/dL (ref 6.5–8.1)

## 2023-04-18 LAB — CBC WITH DIFFERENTIAL (CANCER CENTER ONLY)
Abs Immature Granulocytes: 0.13 10*3/uL — ABNORMAL HIGH (ref 0.00–0.07)
Basophils Absolute: 0 10*3/uL (ref 0.0–0.1)
Basophils Relative: 0 %
Eosinophils Absolute: 0.3 10*3/uL (ref 0.0–0.5)
Eosinophils Relative: 2 %
HCT: 38.3 % (ref 36.0–46.0)
Hemoglobin: 11.9 g/dL — ABNORMAL LOW (ref 12.0–15.0)
Immature Granulocytes: 1 %
Lymphocytes Relative: 7 %
Lymphs Abs: 0.9 10*3/uL (ref 0.7–4.0)
MCH: 30.1 pg (ref 26.0–34.0)
MCHC: 31.1 g/dL (ref 30.0–36.0)
MCV: 96.7 fL (ref 80.0–100.0)
Monocytes Absolute: 0.9 10*3/uL (ref 0.1–1.0)
Monocytes Relative: 7 %
Neutro Abs: 9.9 10*3/uL — ABNORMAL HIGH (ref 1.7–7.7)
Neutrophils Relative %: 83 %
Platelet Count: 137 10*3/uL — ABNORMAL LOW (ref 150–400)
RBC: 3.96 MIL/uL (ref 3.87–5.11)
RDW: 14.7 % (ref 11.5–15.5)
WBC Count: 12.1 10*3/uL — ABNORMAL HIGH (ref 4.0–10.5)
nRBC: 0 % (ref 0.0–0.2)

## 2023-04-18 MED ORDER — GABAPENTIN 100 MG PO CAPS: 100.0000 mg | ORAL_CAPSULE | Freq: Two times a day (BID) | ORAL | 1 refills | Status: DC

## 2023-04-18 MED ORDER — PREDNISONE 20 MG PO TABS: 20.0000 mg | ORAL_TABLET | Freq: Every day | ORAL | 0 refills | Status: DC

## 2023-04-18 NOTE — Progress Notes (Signed)
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  Histologic Grade (WHO / ISUP Grade): G3, nucleoli conspicuous and  eosinophilic at 100 X magnification  Tumor Extent: Extends into major vein (segmental branch adjacent to  renal sinus)  Sarcomatoid Features: Not identified  Rhabdoid Features: Not identified  Tumor Necrosis: Present, microscopic and macroscopic, 20%   MARGINS  Margin Status: All margins negative for invasive carcinoma   REGIONAL LYMPH NODES  Regional Lymph Node Status: Not applicable (no regional lymph nodes  submitted or found)   DISTANT METASTASIS  Distant Site(s) Involved, if applicable: Not applicable   PATHOLOGIC STAGE CLASSIFICATION (pTNM, AJCC 8th Edition):  TNM Descriptors: Applicable  pT3a  pN - Not assigned (no lymph nodes submitted or found)  pM - Not applicable   # PT3A- stage III; G-3; no sarcomatoid features [right nephrectomy- Dr.Sninksi].  # FEB 14th, 2023- CT AP [GI; [facility]]- . Large mass exophytic extending from lower to mid pole of the RIGHT kidney consistent with renal neoplasm. [Dr.Sninski; Uorlogy]  # FEB 2023- Right lower lobe subpleural 4 mm lung nodule; bone survey negative  #April 7th, 2023- On adjuvant [name]    # CKD III [ sec to HTN > 20 years; none now]; Anemia- EGD/colo- 2023- [[facility]]; Dr.Kowalski [No CAD]   Clear cell carcinoma of kidney, right (HCC)  12/12/2021 Initial Diagnosis   Clear cell renal cell carcinoma, right (HCC)   12/18/2021 - 04/05/2022 Chemotherapy   Patient is on Treatment Plan : RENAL CELL Pembrolizumab (200) q21d     12/18/2021 -  Chemotherapy   Patient is on Treatment  Plan : kidney adjuvant Pembrolizumab (200) q21d     01/08/2022 Cancer Staging   Staging form: Kidney, AJCC 8th Edition - Pathologic: Stage III (pT3a, pN0, cM0) - Signed by [name], MD on 01/08/2022     HISTORY OF PRESENTING ILLNESS: Ambulating independently.  Accompanied by husband.  Gabrielle Savage 77 y.o.  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     In the interim evaluated by [facility]-started on prednisone.  Patient currently on 10 mg of prednisone.  Noted to have worsening of her skin itch since 10.  Also difficult sleeping at night.  No worsening cough.  Positive for weight gain.  Mild swelling in the legs.  Denies any worsening joint pains.  No nausea no vomiting.   Review of Systems  Constitutional:  Positive for malaise/fatigue. Negative for chills and fever.  HENT:  Negative for nosebleeds and sore throat.   Eyes:  Negative for double vision.  Respiratory:  Negative for cough, hemoptysis, sputum production and wheezing.   Cardiovascular:  Negative for chest pain, palpitations, orthopnea and leg swelling.  Gastrointestinal:  Negative for abdominal pain, blood in stool, constipation, diarrhea, heartburn, melena, nausea and vomiting.  Genitourinary:  Negative for dysuria, frequency and urgency.  Musculoskeletal:  Positive for back pain and joint pain.  Skin:  Positive for itching and rash.  Neurological:  Negative for dizziness, tingling, focal weakness, weakness and headaches.  Endo/Heme/Allergies:  Does not bruise/bleed easily.  Psychiatric/Behavioral:  Negative for depression. The patient is not  nervous/anxious and does not have insomnia.      MEDICAL HISTORY:  Past Medical History:  Diagnosis Date   Aortic atherosclerosis (HCC)    Arthritis    B12 deficiency    Bradycardia    CAD (coronary artery disease)    Carotid atherosclerosis    CKD (chronic kidney  disease), stage III (HCC)    Diastolic dysfunction 09/03/2021   a.)  TTE 09/03/2021: EF 63%; no RWMAs; LA mildly enlarged; trivial TR/PR, mild AR, moderate MR; G1DD.   DOE (dyspnea on exertion)    Fatigue    Gout    HLD (hyperlipidemia)    Hypertension    IDA (iron deficiency anemia)    Iliotibial band syndrome, left leg    MDD (major depressive disorder)    OAB (overactive bladder)    Obesity    PAC (premature atrial contraction)    noted on Holter   PONV (postoperative nausea and vomiting)    a.) single episode in 03/2013   PVC (premature ventricular contraction)    noted on Holter   Right kidney mass 10/27/2021   a.) CT 10/27/2021 -- mass measuring 8.3 x 6.8 x 8.1 cm   Sensorineural hearing loss (SNHL) of both ears    SUI (stress urinary incontinence, female)    SVT (supraventricular tachycardia)    noted on Holter   T2DM (type 2 diabetes mellitus) (HCC)    Uterovaginal prolapse    Vitamin D deficiency    Voiding dysfunction     SURGICAL HISTORY: Past Surgical History:  Procedure Laterality Date   ABDOMINAL HYSTERECTOMY     partial   BLADDER SUSPENSION     CHOLECYSTECTOMY     COLONOSCOPY N/A 07/25/2017   Procedure: COLONOSCOPY;  Surgeon: Christena Deem, MD;  Location: [facility] ENDOSCOPY;  Service: Endoscopy;  Laterality: N/A;   COLONOSCOPY WITH PROPOFOL N/A 10/19/2021   Procedure: COLONOSCOPY WITH PROPOFOL;  Surgeon: Regis Bill, MD;  Location: [facility] ENDOSCOPY;  Service: Endoscopy;  Laterality: N/A;  DM   ESOPHAGOGASTRODUODENOSCOPY (EGD) WITH PROPOFOL N/A 10/19/2021   Procedure: ESOPHAGOGASTRODUODENOSCOPY (EGD) WITH PROPOFOL;  Surgeon: Regis Bill, MD;  Location: [facility] ENDOSCOPY;  Service: Endoscopy;  Laterality: N/A;   JOINT REPLACEMENT Bilateral    PARTIAL KNEE REPLACEMENT   KNEE SURGERY     LAPAROSCOPIC NEPHRECTOMY, HAND ASSISTED Right 11/20/2021   Procedure: HAND ASSISTED LAPAROSCOPIC RADICAL NEPHRECTOMY;  Surgeon: Sondra Come, MD;  Location:  [facility] ORS;  Service: Urology;  Laterality: Right;   PARATHYROIDECTOMY      SOCIAL HISTORY: Social History   Socioeconomic History   Marital status: Married    Spouse name: Not on file   Number of children: Not on file   Years of education: Not on file   Highest education level: Not on file  Occupational History   Not on file  Tobacco Use   Smoking status: Never    Passive exposure: Never   Smokeless tobacco: Never  Vaping Use   Vaping status: Never Used  Substance and Sexual Activity   Alcohol use: Not Currently    Comment: occ   Drug use: No   Sexual activity: Not Currently  Other Topics Concern   Not on file  Social History Narrative   Lives in [place] - close hospital; with husband; 3 biological children [[place]; [place]; [place]]. Never smoked; glass of wine rare. 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    Social Determinants of Health   Financial Resource Strain: Low Risk  (10/28/2022)   Received  from [facility], [facility]   Overall Financial Resource Strain (CARDIA)    Difficulty of Paying Living Expenses: Not hard at all  Food Insecurity: No Food Insecurity (10/28/2022)   Received from [facility], [facility]   Hunger Vital Sign    Worried About Running Out of Food in the Last Year: Never true    Ran Out of Food in the Last Year: Never true  Transportation Needs: No Transportation Needs (10/28/2022)   Received from [facility], [facility]   [facility] - Transportation    In the past 12 months, has lack of transportation kept you from medical appointments or from getting medications?: No    Lack of Transportation (Non-Medical): No  Physical Activity: Insufficiently Active (12/07/2021)   Exercise Vital Sign    Days of Exercise per Week: 2 days    Minutes of Exercise per Session: 30 min  Stress: No Stress Concern Present (12/07/2021)   [employer] of  Occupational Health - Occupational Stress Questionnaire    Feeling of Stress : Only a little  Social Connections: Moderately Integrated (12/07/2021)   Social Connection and Isolation Panel [NHANES]    Frequency of Communication with Friends and Family: Three times a week    Frequency of Social Gatherings with Friends and Family: Three times a week    Attends Religious Services: 1 to 4 times per year    Active Member of Clubs or Organizations: No    Attends Banker Meetings: Never    Marital Status: Married  Catering manager Violence: Not At Risk (12/07/2021)   Humiliation, Afraid, Rape, and Kick questionnaire    Fear of Current or Ex-Partner: No    Emotionally Abused: No    Physically Abused: No    Sexually Abused: No    FAMILY HISTORY: Family History  Problem Relation Age of Onset   Heart failure Mother    Stroke Father    Lung cancer Father    Breast cancer Neg Hx     ALLERGIES:  is allergic to codeine, nsaids, and oxycodone.  MEDICATIONS:  Current Outpatient Medications  Medication Sig Dispense Refill   allopurinol (ZYLOPRIM) 100 MG tablet Take 300 mg by mouth every morning. Pt has decreased to 100mg      amLODipine (NORVASC) 5 MG tablet Take 5 mg by mouth every morning.     citalopram (CELEXA) 10 MG tablet Take 10 mg by mouth every morning.     ferrous sulfate 325 (65 FE) MG tablet Take 325 mg by mouth every other day.     gabapentin (NEURONTIN) 100 MG capsule Take 1 capsule (100 mg total) by mouth 2 (two) times daily. 60 capsule 1   hydrOXYzine (ATARAX) 10 MG tablet Take 1 tablet (10 mg total) by mouth at bedtime as needed. For itching. 60 tablet 1   irbesartan (AVAPRO) 150 MG tablet Take 150 mg by mouth every morning.     montelukast (SINGULAIR) 10 MG tablet TAKE 1 TABLET BY MOUTH AT BEDTIME 30 tablet 2   predniSONE (DELTASONE) 20 MG tablet Take 1 tablet (20 mg total) by mouth daily with breakfast. Once a day with food x 10 days. 30 tablet 0   triamcinolone  ointment (KENALOG) 0.5 % Apply 1 Application topically 2 (two) times daily. 80 g 1   vitamin B-12 (CYANOCOBALAMIN) 1000 MCG tablet Take 1,000 mcg by mouth daily.     docusate sodium (COLACE) 100 MG capsule  Take 1 capsule (100 mg total) by mouth 2 (two) times daily. (Patient not taking: Reported on 02/14/2023) 20 capsule 0   No current facility-administered medications for this visit.      Marland Kitchen  PHYSICAL EXAMINATION:  Vitals:   04/18/23 1424  BP: (!) 145/65  Pulse: 68  Temp: 99.3 F (37.4 C)  SpO2: 96%    Filed Weights   04/18/23 1424  Weight: 209 lb 6.4 oz (95 kg)    Mild maculopapular rash noted in the back and also front.   Physical Exam Vitals and nursing note reviewed.  HENT:     Head: Normocephalic and atraumatic.     Mouth/Throat:     Pharynx: Oropharynx is clear.  Eyes:     Extraocular Movements: Extraocular movements intact.     Pupils: Pupils are equal, round, and reactive to light.  Cardiovascular:     Rate and Rhythm: Normal rate and regular rhythm.  Pulmonary:     Comments: Decreased breath sounds bilaterally.  Abdominal:     Palpations: Abdomen is soft.  Musculoskeletal:        General: Normal range of motion.     Cervical back: Normal range of motion.  Skin:    General: Skin is warm.  Neurological:     General: No focal deficit present.     Mental Status: She is alert and oriented to person, place, and time.  Psychiatric:        Behavior: Behavior normal.        Judgment: Judgment normal.      LABORATORY DATA:  I have reviewed the data as listed Lab Results  Component Value Date   WBC 12.1 (H) 04/18/2023   HGB 11.9 (L) 04/18/2023   HCT 38.3 04/18/2023   MCV 96.7 04/18/2023   PLT 137 (L) 04/18/2023   Recent Labs    01/11/23 0947 02/14/23 1423 02/28/23 1035 04/18/23 1414  NA 137 137 139 139  K 4.3 4.8 4.5 4.6  CL 110 109 110 114*  CO2 20* 22 20* 17*  GLUCOSE 121* 129* 149* 140*  BUN 46* 50* 72* 61*  CREATININE 2.51* 2.15* 2.31*  2.11*  CALCIUM 9.0 9.0 8.6* 8.8*  GFRNONAA 19* 23* 22* 24*  PROT 6.8 7.2  --  6.5  ALBUMIN 4.0 3.9  --  3.9  AST 16 13*  --  11*  ALT 15 11  --  16  ALKPHOS 128* 137*  --  102  BILITOT 0.2* 0.4  --  0.5    RADIOGRAPHIC STUDIES: I have personally reviewed the radiological images as listed and agreed with the findings in the report. No results found.  Clear cell carcinoma of kidney, right (HCC) #Stage IIIa-clear-cell renal carcinoma-s/p  adjuvant [name] x 1 year [MAY 2024]. MAY 30th, 2024- CT CAP- non contrast- Status post interval right nephrectomy. No suspicious soft tissue in the nephrectomy bed or other noncontrast CT evidence of recurrent or metastatic disease in the chest, abdomen, or pelvis.  Will plan surveillance imaging every 6 months also. Will order at next visit.   # June 2024- CT Unchanged 0.4 cm nodule of the dependent right lower lobe, nonspecific and almost certainly benign incidental sequelae of prior infection or inflammation. Recommend follow up in 6 months.   Will order at next visit.   # Skin rash/pruritus -likely secondary to [name]-- continue allegra; cerave; vaseline; atarax prn. Continue kenalog. Add singulair. Worse/ not better- continue prednisone 20 mg/day; ADD neurontin- 100 mg BID.   #  Intermittently- elevated calcium- 10.4; STOP vit D 50k; today calcium is 10.0.  Vit D levels- 69 [SEP 2023]- stable.    # Anemia: Hemoglobin around 11-12 overall stable/improving; on PO iron every other day. NOV 2023- Iron sat-35%; ferretin- 330-stable.     #CKD stage IV- GFR ~19; Continue enough hydration. Monitor closely. [s/p Dr.Korrapati Nephrology evaluation next week. 2023]-stable.   #Diabetes: patient OFF metformin;/glimepiride [as per nephrology]-monitor blood sugars closely at home.   # IV ACCESS: PIV.    # DISPOSITION: # follow up in MD; 4 weeks; labs-cbc/bmp- Dr.B     All questions were answered. The patient knows to call the clinic with any problems,  questions or concerns.   [name], MD 04/18/2023 3:04 PM

## 2023-04-18 NOTE — Assessment & Plan Note (Addendum)
#  Stage IIIa-clear-cell renal carcinoma-s/p  adjuvant [name] x 1 year [MAY 2024]. MAY 30th, 2024- CT CAP- non contrast- Status post interval right nephrectomy. No suspicious soft tissue in the nephrectomy bed or other noncontrast CT evidence of recurrent or metastatic disease in the chest, abdomen, or pelvis.  Will plan surveillance imaging every 6 months also. Will order at next visit.   # June 2024- CT Unchanged 0.4 cm nodule of the dependent right lower lobe, nonspecific and almost certainly benign incidental sequelae of prior infection or inflammation. Recommend follow up in 6 months.   Will order at next visit.   # Skin rash/pruritus -likely secondary to [name]-- continue allegra; cerave; vaseline; atarax prn. Continue kenalog. Add singulair. Worse/ not better- continue prednisone 20 mg/day; ADD neurontin- 100 mg BID. Awaiting evaluation with Dr.Kowalski/derm- consider use of omalizumab/dupilumab.   # Intermittently- elevated calcium- 10.4; STOP vit D 50k; today calcium is 10.0.  Vit D levels- 69 [SEP 2023]- stable.    # Anemia: Hemoglobin around 11-12 overall stable/improving; on PO iron every other day. NOV 2023- Iron sat-35%; ferretin- 330-stable.     #CKD stage IV- GFR ~19; Continue enough hydration. Monitor closely. [s/p Dr.Korrapati Nephrology evaluation next week. 2023]-stable.   #Diabetes: patient OFF metformin;/glimepiride [as per nephrology]-monitor blood sugars closely at home.   # IV ACCESS: PIV.    # DISPOSITION: # follow up in MD; 4 weeks; labs-cbc/bmp- Dr.B

## 2023-04-18 NOTE — Progress Notes (Signed)
C/o of generalized itching. Taking prednisone, on half a pill and starting to itch again. Also Taking atarax. Sees Dermatology this month.  C/o ankle swelling.

## 2023-04-28 ENCOUNTER — Other Ambulatory Visit: Payer: Self-pay | Admitting: Internal Medicine

## 2023-04-28 DIAGNOSIS — Z1231 Encounter for screening mammogram for malignant neoplasm of breast: Secondary | ICD-10-CM

## 2023-05-05 ENCOUNTER — Ambulatory Visit (INDEPENDENT_AMBULATORY_CARE_PROVIDER_SITE_OTHER): Payer: Medicare Other | Admitting: Dermatology

## 2023-05-05 DIAGNOSIS — L0100 Impetigo, unspecified: Secondary | ICD-10-CM

## 2023-05-05 DIAGNOSIS — Z79899 Other long term (current) drug therapy: Secondary | ICD-10-CM | POA: Diagnosis not present

## 2023-05-05 DIAGNOSIS — Z7189 Other specified counseling: Secondary | ICD-10-CM

## 2023-05-05 DIAGNOSIS — R21 Rash and other nonspecific skin eruption: Secondary | ICD-10-CM

## 2023-05-05 DIAGNOSIS — L308 Other specified dermatitis: Secondary | ICD-10-CM

## 2023-05-05 MED ORDER — TRIAMCINOLONE ACETONIDE 0.1 % EX CREA: TOPICAL_CREAM | CUTANEOUS | 1 refills | Status: DC

## 2023-05-05 MED ORDER — MUPIROCIN 2 % EX OINT: TOPICAL_OINTMENT | CUTANEOUS | 0 refills | Status: DC

## 2023-05-05 MED ORDER — DOXYCYCLINE MONOHYDRATE 100 MG PO CAPS: ORAL_CAPSULE | ORAL | 0 refills | Status: DC

## 2023-05-05 NOTE — Progress Notes (Signed)
New Patient Visit   Subjective  Gabrielle Savage is a 77 y.o. female who presents for the following: Patient c/o extremely itchy rash on her back and shoulders x 5 months treating Prednisone tablets x 3 months, this rash appeared after patient finished [name] for kidney cancer. Patient was given IMK injection 7 days ago little improvement.     The following portions of the chart were reviewed this encounter and updated as appropriate: medications, allergies, medical history  Review of Systems:  No other skin or systemic complaints except as noted in HPI or Assessment and Plan.  Objective  Well appearing patient in no apparent distress; mood and affect are within normal limits.  A focused examination was performed of the following areas:face,back    Relevant exam findings are noted in the Assessment and Plan.  Right Buttock, Right Lower buttock Scaly erythematous papules and patches +/- dyspigmentation, lichenification, excoriations.        Assessment & Plan   Rash With excoriations and impetigo Rule out drug reaction versus collagen vascular disease versus atopic dermatitis Right Buttock; Right Lower buttock  Start Mupirocin ointment apply to qd-bid to biopsy sites Start Triamcinolone cream apply to itchy irritated skin qd-bid prn Avoid applying to face, groin, and axilla. Use as directed. Long-term use can cause thinning of the skin.  Start Doxycycline 100 mg take 1 tablet po bid x 7 days  Doxycycline should be taken with food to prevent nausea. Do not lay down for 30 minutes after taking. Be cautious with sun exposure and use good sun protection while on this medication. Pregnant women should not take this medication.    May consider starting Dupixent injection pending biopsy results   Skin / nail biopsy - Right Buttock Type of biopsy: punch   Informed consent: discussed and consent obtained   Timeout: patient name, date of birth, surgical site, and procedure verified    Procedure prep:  Patient was prepped and draped in usual sterile fashion (the patient was cleansed and prepped) Prep type:  Isopropyl alcohol Anesthesia: the lesion was anesthetized in a standard fashion   Anesthetic:  1% lidocaine w/ epinephrine 1-100,000 buffered w/ 8.4% NaHCO3 Punch size:  4 mm Suture size:  4-0 Suture type: nylon   Hemostasis achieved with: suture, pressure and aluminum chloride   Outcome: patient tolerated procedure well   Post-procedure details: sterile dressing applied and wound care instructions given   Dressing type: bandage, petrolatum and pressure dressing    Skin / nail biopsy - Right Lower buttock Type of biopsy: punch   Informed consent: discussed and consent obtained   Timeout: patient name, date of birth, surgical site, and procedure verified   Procedure prep:  Patient was prepped and draped in usual sterile fashion (the patient was cleansed and prepped) Prep type:  Isopropyl alcohol Anesthesia: the lesion was anesthetized in a standard fashion   Anesthetic:  1% lidocaine w/ epinephrine 1-100,000 buffered w/ 8.4% NaHCO3 Punch size:  4 mm Suture size:  4-0 Suture type: nylon   Hemostasis achieved with: suture, pressure and aluminum chloride   Outcome: patient tolerated procedure well   Post-procedure details: sterile dressing applied and wound care instructions given   Dressing type: bandage, petrolatum and pressure dressing    Specimen 1 - Surgical pathology Differential Diagnosis: R/O Eczematous dermatitis vs drug reaction vs other   Check Margins: No  Specimen 2 - Surgical pathology Differential Diagnosis: R/O Eczematous dermatitis vs drug reaction vs other  Check Margins: No  Return in about 6 days (around 05/11/2023) for suture removal, discuss biopsy results .  IAngelique Holm, CMA, am acting as scribe for Armida Sans, MD .   Documentation: I have reviewed the above documentation for accuracy and completeness, and I agree with the  above.  Armida Sans, MD

## 2023-05-05 NOTE — Patient Instructions (Addendum)
Doxycycline should be taken with food to prevent nausea. Do not lay down for 30 minutes after taking. Be cautious with sun exposure and use good sun protection while on this medication. Pregnant women should not take this medication.       Wound Care Instructions  Cleanse wound gently with soap and water once a day then pat dry with clean gauze. Apply a thin coat of Petrolatum (petroleum jelly, "Vaseline") over the wound (unless you have an allergy to this). We recommend that you use a new, sterile tube of Vaseline. Do not pick or remove scabs. Do not remove the yellow or white "healing tissue" from the base of the wound.  Cover the wound with fresh, clean, nonstick gauze and secure with paper tape. You may use Band-Aids in place of gauze and tape if the wound is small enough, but would recommend trimming much of the tape off as there is often too much. Sometimes Band-Aids can irritate the skin.  You should call the office for your biopsy report after 1 week if you have not already been contacted.  If you experience any problems, such as abnormal amounts of bleeding, swelling, significant bruising, significant pain, or evidence of infection, please call the office immediately.  FOR ADULT SURGERY PATIENTS: If you need something for pain relief you may take 1 extra strength Tylenol (acetaminophen) AND 2 Ibuprofen (200mg  each) together every 4 hours as needed for pain. (do not take these if you are allergic to them or if you have a reason you should not take them.) Typically, you may only need pain medication for 1 to 3 days.        Due to recent changes in healthcare laws, you may see results of your pathology and/or laboratory studies on MyChart before the doctors have had a chance to review them. We understand that in some cases there may be results that are confusing or concerning to you. Please understand that not all results are received at the same time and often the doctors may need to  interpret multiple results in order to provide you with the best plan of care or course of treatment. Therefore, we ask that you please give [place] 2 business days to thoroughly review all your results before contacting the office for clarification. Should we see a critical lab result, you will be contacted sooner.   If You Need Anything After Your Visit  If you have any questions or concerns for your doctor, please call our main line at [phone] and press option 4 to reach your doctor's medical assistant. If no one answers, please leave a voicemail as directed and we will return your call as soon as possible. Messages left after 4 pm will be answered the following business day.   You may also send [place] a message via MyChart. We typically respond to MyChart messages within 1-2 business days.  For prescription refills, please ask your pharmacy to contact our office. Our fax number is [phone].  If you have an urgent issue when the clinic is closed that cannot wait until the next business day, you can page your doctor at the number below.    Please note that while we do our best to be available for urgent issues outside of office hours, we are not available 24/7.   If you have an urgent issue and are unable to reach [place], you may choose to seek medical care at your doctor's office, retail clinic, urgent care center, or emergency room.  If you have a medical emergency, please immediately call 911 or go to the emergency department.  Pager Numbers  - Dr. Gwen Pounds: [phone]  - Dr. Roseanne Reno: [phone]  - Dr. Katrinka Blazing: [phone]   In the event of inclement weather, please call our main line at [phone] for an update on the status of any delays or closures.  Dermatology Medication Tips: Please keep the boxes that topical medications come in in order to help keep track of the instructions about where and how to use these. Pharmacies typically print the medication instructions only on  the boxes and not directly on the medication tubes.   If your medication is too expensive, please contact our office at [phone] option 4 or send [place] a message through MyChart.   We are unable to tell what your co-pay for medications will be in advance as this is different depending on your insurance coverage. However, we may be able to find a substitute medication at lower cost or fill out paperwork to get insurance to cover a needed medication.   If a prior authorization is required to get your medication covered by your insurance company, please allow [place] 1-2 business days to complete this process.  Drug prices often vary depending on where the prescription is filled and some pharmacies may offer cheaper prices.  The website www.goodrx.com contains coupons for medications through different pharmacies. The prices here do not account for what the cost may be with help from insurance (it may be cheaper with your insurance), but the website can give you the price if you did not use any insurance.  - You can print the associated coupon and take it with your prescription to the pharmacy.  - You may also stop by our office during regular business hours and pick up a GoodRx coupon card.  - If you need your prescription sent electronically to a different pharmacy, notify our office through [facility] or by phone at [phone] option 4.     Si Usted Necesita Algo Despus de Su Visita  Tambin puede enviarnos un mensaje a travs de Clinical cytogeneticist. Por lo general respondemos a los mensajes de MyChart en el transcurso de 1 a 2 das hbiles.  Para renovar recetas, por favor pida a su farmacia que se ponga en contacto con nuestra oficina. Annie Sable de fax es [place] [phone].  Si tiene un asunto urgente cuando la clnica est cerrada y que no puede esperar hasta el siguiente da hbil, puede llamar/localizar a su doctor(a) al nmero que aparece a continuacin.   Por favor, tenga en cuenta que  aunque hacemos todo lo posible para estar disponibles para asuntos urgentes fuera del horario de [place], no estamos disponibles las 24 horas del da, los 7 [address]  [address] de la [place].   Si tiene un problema urgente y no puede comunicarse con nosotros, puede optar por buscar atencin mdica  en el consultorio de su doctor(a), en una clnica privada, en un centro de atencin urgente o en una sala de emergencias.  Si tiene Engineer, drilling, por favor llame inmediatamente al 911 o vaya a la sala de emergencias.  Nmeros de bper  - Dr. Gwen Pounds: [phone]  - Dra. Roseanne Reno: [phone]  - Dr. Katrinka Blazing: [phone]   En caso de inclemencias del tiempo, por favor llame a Lacy Duverney principal al [phone] para una actualizacin sobre el [place] de cualquier retraso o cierre.  Consejos para la medicacin en dermatologa: Por favor, guarde las cajas en las que vienen los  medicamentos de uso tpico para ayudarle a seguir las [employer] dnde y cmo usarlos. Las farmacias generalmente imprimen las instrucciones del medicamento slo en las cajas y no directamente en los tubos del [place].   Si su medicamento es muy caro, por favor, pngase en contacto con Rolm Gala llamando al [phone] y presione la opcin 4 o envenos un mensaje a travs de Clinical cytogeneticist.   No podemos decirle cul ser su copago por los medicamentos por adelantado ya que esto es diferente dependiendo de la cobertura de su seguro. Sin embargo, es posible que podamos encontrar un medicamento sustituto a Audiological scientist un formulario para que el seguro cubra el medicamento que se considera necesario.   Si se requiere una autorizacin previa para que su compaa de seguros [place] su medicamento, por favor permtanos de 1 a 2 das hbiles para completar [address]  Los precios de los medicamentos varan con frecuencia dependiendo del Environmental consultant de dnde se surte la receta y alguna farmacias pueden ofrecer precios ms  baratos.  El sitio web www.goodrx.com tiene cupones para medicamentos de Health and safety inspector. Los precios aqu no tienen en cuenta lo que podra costar con la ayuda del seguro (puede ser ms barato con su seguro), pero el sitio web puede darle el precio si no utiliz Tourist information centre manager.  - Puede imprimir el cupn correspondiente y llevarlo con su receta a la farmacia.  - Tambin puede pasar por nuestra oficina durante el horario de atencin regular y Education officer, museum una tarjeta de cupones de GoodRx.  - Si necesita que su receta se enve electrnicamente a una farmacia diferente, informe a nuestra oficina a travs de MyChart de [place] o por telfono llamando al [phone] y presione la opcin 4.

## 2023-05-11 ENCOUNTER — Ambulatory Visit: Payer: Medicare Other | Admitting: Dermatology

## 2023-05-11 DIAGNOSIS — Z79899 Other long term (current) drug therapy: Secondary | ICD-10-CM | POA: Diagnosis not present

## 2023-05-11 DIAGNOSIS — L0100 Impetigo, unspecified: Secondary | ICD-10-CM

## 2023-05-11 DIAGNOSIS — R21 Rash and other nonspecific skin eruption: Secondary | ICD-10-CM | POA: Diagnosis not present

## 2023-05-11 DIAGNOSIS — Z7189 Other specified counseling: Secondary | ICD-10-CM

## 2023-05-11 NOTE — Patient Instructions (Addendum)

## 2023-05-11 NOTE — Progress Notes (Signed)
Follow-Up Visit   Subjective  Gabrielle Savage is a 77 y.o. female who presents for the following: Rash - patient here today for suture removal and to discuss pathology results.   The patient has spots, moles and lesions to be evaluated, some may be new or changing and the patient may have concern these could be cancer.   The following portions of the chart were reviewed this encounter and updated as appropriate: medications, allergies, medical history  Review of Systems:  No other skin or systemic complaints except as noted in HPI or Assessment and Plan.  Objective  Well appearing patient in no apparent distress; mood and affect are within normal limits.   A focused examination was performed of the following areas: the face and trunk   Relevant exam findings are noted in the Assessment and Plan.  L post shoulder (lesional), L post shoulder (peri lesional) Scaly erythematous papules and patches +/- dyspigmentation, lichenification, excoriations.     Assessment & Plan    Rash and other nonspecific skin eruption With impetigo She is improved on oral doxycycline and topical triamcinolone Pathology from the buttocks x 2 biopsies both showed interface dermatitis possibly secondary to drug reaction. Today we plan to do a biopsy for for DIF And May due to some serologies to rule out collagen vascular disease.   Related Procedures ANA w/Reflex  Rash L post shoulder (peri lesional); L post shoulder (lesional)  Differential diagnosis:  Interface dermatitis secondary to drug reaction vs lupus vs dermatomyositis   Treatment Plan: Discussed and recommend additional DIF biopsies to r/o lupus and dermatomyositis   Continue TMC 0.1% to aa's QD-BID PRN. Avoid applying to face, groin, and axilla. Use as directed. Long-term use can cause thinning of the skin.   Continue Mupirocin 2% ointment to healing biopsy sites QD.  Skin / nail biopsy - L post shoulder (lesional) Type of  biopsy: punch   Informed consent: discussed and consent obtained   Timeout: patient name, date of birth, surgical site, and procedure verified   Procedure prep:  Patient was prepped and draped in usual sterile fashion (the patient was cleaned and prepped) Prep type:  Isopropyl alcohol Anesthesia: the lesion was anesthetized in a standard fashion   Anesthetic:  1% lidocaine w/ epinephrine 1-100,000 buffered w/ 8.4% NaHCO3 Punch size:  3 mm Suture size:  4-0 Suture type: nylon   Suture removal (days):  8 Hemostasis achieved with: suture, pressure and aluminum chloride   Outcome: patient tolerated procedure well   Post-procedure details: sterile dressing applied and wound care instructions given   Dressing type: bandage, petrolatum and pressure dressing    Skin / nail biopsy - L post shoulder (peri lesional) Type of biopsy: punch   Informed consent: discussed and consent obtained   Timeout: patient name, date of birth, surgical site, and procedure verified   Procedure prep:  Patient was prepped and draped in usual sterile fashion (the patient was cleaned and prepped) Prep type:  Isopropyl alcohol Anesthesia: the lesion was anesthetized in a standard fashion   Anesthetic:  1% lidocaine w/ epinephrine 1-100,000 buffered w/ 8.4% NaHCO3 Punch size:  3 mm Suture size:  4-0 Suture type: nylon   Suture removal (days):  8 Hemostasis achieved with: suture, pressure and aluminum chloride   Outcome: patient tolerated procedure well   Post-procedure details: sterile dressing applied and wound care instructions given   Dressing type: bandage, petrolatum and pressure dressing    Specimen 1 - Surgical pathology Differential  Diagnosis: Interface dermatitis secondary to drug reaction vs lupus vs dermatomyositis  Check Margins: No DIF, Michel's transport [id number]  Specimen 2 - Surgical pathology Differential Diagnosis: Interface dermatitis secondary to drug reaction vs lupus vs  dermatomyositis Check Margins: No DIF, Michel's transport [id number]   Other Procedures Placed This Encounter ENA+DNA/DS+Sjorgen's  Continue topical treatment until follow-up.  Return in about 8 days (around 05/19/2023) for suture removal and follow up.  Maylene Roes, CMA, am acting as scribe for Armida Sans, MD .   Documentation: I have reviewed the above documentation for accuracy and completeness, and I agree with the above.  Armida Sans, MD

## 2023-05-13 ENCOUNTER — Inpatient Hospital Stay (HOSPITAL_BASED_OUTPATIENT_CLINIC_OR_DEPARTMENT_OTHER): Payer: Medicare Other | Admitting: Internal Medicine

## 2023-05-13 ENCOUNTER — Inpatient Hospital Stay: Payer: Medicare Other

## 2023-05-13 ENCOUNTER — Encounter: Payer: Self-pay | Admitting: Dermatology

## 2023-05-13 ENCOUNTER — Encounter: Payer: Self-pay | Admitting: Internal Medicine

## 2023-05-13 VITALS — BP 174/71 | HR 69 | Temp 98.8°F | Ht 61.0 in | Wt 216.2 lb

## 2023-05-13 DIAGNOSIS — E559 Vitamin D deficiency, unspecified: Secondary | ICD-10-CM | POA: Diagnosis not present

## 2023-05-13 DIAGNOSIS — C641 Malignant neoplasm of right kidney, except renal pelvis: Secondary | ICD-10-CM | POA: Diagnosis not present

## 2023-05-13 LAB — CBC WITH DIFFERENTIAL (CANCER CENTER ONLY)
Abs Immature Granulocytes: 0.08 10*3/uL — ABNORMAL HIGH (ref 0.00–0.07)
Basophils Absolute: 0 10*3/uL (ref 0.0–0.1)
Basophils Relative: 1 %
Eosinophils Absolute: 0.2 10*3/uL (ref 0.0–0.5)
Eosinophils Relative: 4 %
HCT: 36.1 % (ref 36.0–46.0)
Hemoglobin: 10.8 g/dL — ABNORMAL LOW (ref 12.0–15.0)
Immature Granulocytes: 1 %
Lymphocytes Relative: 12 %
Lymphs Abs: 0.8 10*3/uL (ref 0.7–4.0)
MCH: 29.8 pg (ref 26.0–34.0)
MCHC: 29.9 g/dL — ABNORMAL LOW (ref 30.0–36.0)
MCV: 99.7 fL (ref 80.0–100.0)
Monocytes Absolute: 0.7 10*3/uL (ref 0.1–1.0)
Monocytes Relative: 11 %
Neutro Abs: 4.5 10*3/uL (ref 1.7–7.7)
Neutrophils Relative %: 71 %
Platelet Count: 126 10*3/uL — ABNORMAL LOW (ref 150–400)
RBC: 3.62 MIL/uL — ABNORMAL LOW (ref 3.87–5.11)
RDW: 14.6 % (ref 11.5–15.5)
WBC Count: 6.3 10*3/uL (ref 4.0–10.5)
nRBC: 0 % (ref 0.0–0.2)

## 2023-05-13 LAB — ENA+DNA/DS+SJORGEN'S
ENA RNP Ab: 2.2 AI — ABNORMAL HIGH (ref 0.0–0.9)
ENA SM Ab Ser-aCnc: <0.2 AI (ref 0.0–0.9)
ENA SSA (RO) Ab: <0.2 AI (ref 0.0–0.9)
ENA SSB (LA) Ab: <0.2 AI (ref 0.0–0.9)
dsDNA Ab: <1 [IU]/mL (ref 0–9)

## 2023-05-13 LAB — BASIC METABOLIC PANEL - CANCER CENTER ONLY
Anion gap: 6 (ref 5–15)
BUN: 49 mg/dL — ABNORMAL HIGH (ref 8–23)
CO2: 21 mmol/L — ABNORMAL LOW (ref 22–32)
Calcium: 8.9 mg/dL (ref 8.9–10.3)
Chloride: 114 mmol/L — ABNORMAL HIGH (ref 98–111)
Creatinine: 1.98 mg/dL — ABNORMAL HIGH (ref 0.44–1.00)
GFR, Estimated: 26 mL/min — ABNORMAL LOW (ref >60)
Glucose, Bld: 104 mg/dL — ABNORMAL HIGH (ref 70–99)
Potassium: 4.7 mmol/L (ref 3.5–5.1)
Sodium: 141 mmol/L (ref 135–145)

## 2023-05-13 LAB — ANA W/REFLEX: Anti Nuclear Antibody (ANA): POSITIVE — AB

## 2023-05-13 NOTE — Assessment & Plan Note (Addendum)
#  Stage IIIa-clear-cell renal carcinoma-s/p  adjuvant [name] x 1 year [MAY 2024]. MAY 30th, 2024- CT CAP- non contrast- Status post interval right nephrectomy. No suspicious soft tissue in the nephrectomy bed or other noncontrast CT evidence of recurrent or metastatic disease in the chest, abdomen, or pelvis.  Will plan surveillance imaging every 6 months also. Will order today  # June 2024- CT Unchanged 0.4 cm nodule of the dependent right lower lobe, nonspecific and almost certainly benign incidental sequelae of prior infection or inflammation. Recommend follow up in 6 months.  Will order today  # Mild anemia- 9-10- check iron studies.   # Mild thrombocytopenia > 100- monitor for now.   # Skin rash/pruritus -likely secondary to [name]-- continue allegra; cerave; vaseline; atarax prn. Continue kenalog. Add singulair. Worse/ not better-  prednisone 20 mg/day; ADD neurontin- 100 mg BID. Awaiting evaluation with Dr.Kowalski/derm- consider use of omalizumab/dupilumab.   # Intermittently- elevated calcium- 10.4; STOP vit D 50k; today calcium is 10.0.  Vit D levels- 69 [SEP 2023]- stable.    # Anemia: Hemoglobin around 10.8 overall stable/improving; on PO iron every other day. NOV 2023- Iron sat-35%; ferretin- 330-stable.     #CKD stage IV- GFR ~19; Continue enough hydration. Monitor closely. [s/p Dr.Korrapati Nephrology evaluation next week. 2023]-stable.   #Diabetes: patient OFF metformin;/glimepiride [as per nephrology]-monitor blood sugars closely at home.   # IV ACCESS: PIV.    # DISPOSITION: # follow up in MD; 3 months- labs-cbc/cmp; iron studies; ferritin; LDH; vit D25 OH  CT CAP prior- Dr.B

## 2023-05-13 NOTE — Progress Notes (Signed)
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  Histologic Grade (WHO / ISUP Grade): G3, nucleoli conspicuous and  eosinophilic at 100 X magnification  Tumor Extent: Extends into major vein (segmental branch adjacent to  renal sinus)  Sarcomatoid Features: Not identified  Rhabdoid Features: Not identified  Tumor Necrosis: Present, microscopic and macroscopic, 20%   MARGINS  Margin Status: All margins negative for invasive carcinoma   REGIONAL LYMPH NODES  Regional Lymph Node Status: Not applicable (no regional lymph nodes  submitted or found)   DISTANT METASTASIS  Distant Site(s) Involved, if applicable: Not applicable   PATHOLOGIC STAGE CLASSIFICATION (pTNM, AJCC 8th Edition):  TNM Descriptors: Applicable  pT3a  pN - Not assigned (no lymph nodes submitted or found)  pM - Not applicable   # PT3A- stage III; G-3; no sarcomatoid features [right nephrectomy- Dr.Sninksi].  # FEB 14th, 2023- CT AP [GI; [facility]]- . Large mass exophytic extending from lower to mid pole of the RIGHT kidney consistent with renal neoplasm. [Dr.Sninski; Uorlogy]  # FEB 2023- Right lower lobe subpleural 4 mm lung nodule; bone survey negative  #April 7th, 2023- On adjuvant [name]    # CKD III [ sec to HTN > 20 years; none now]; Anemia- EGD/colo- 2023- [[facility]]; Dr.Kowalski [No CAD]   Clear cell carcinoma of kidney, right (HCC)  12/12/2021 Initial Diagnosis   Clear cell renal cell carcinoma, right (HCC)   12/18/2021 - 04/05/2022 Chemotherapy   Patient is on Treatment Plan : RENAL CELL Pembrolizumab (200) q21d     12/18/2021 -  Chemotherapy   Patient is on Treatment  Plan : kidney adjuvant Pembrolizumab (200) q21d     01/08/2022 Cancer Staging   Staging form: Kidney, AJCC 8th Edition - Pathologic: Stage III (pT3a, pN0, cM0) - Signed by [name], MD on 01/08/2022     HISTORY OF PRESENTING ILLNESS: Ambulating independently.  Accompanied by husband.  Gabrielle Savage 77 y.o.  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   dermatology gave her doxycycline, bactroban ointment and kenalog cream. Had 2 bx's last week and this week    Patient currently off prednisone.  Noted to have improvement of her skin itch.   No worsening cough.  Positive for weight gain.  Mild swelling in the legs.  Denies any worsening joint pains.  No nausea no vomiting. Complains of swelling in legs.   Review of Systems  Constitutional:  Positive for malaise/fatigue. Negative for chills and fever.  HENT:  Negative for nosebleeds and sore throat.   Eyes:  Negative for double vision.  Respiratory:  Negative for cough, hemoptysis, sputum production and wheezing.   Cardiovascular:  Negative for chest pain, palpitations, orthopnea and leg swelling.  Gastrointestinal:  Negative for abdominal pain, blood in stool, constipation, diarrhea, heartburn, melena, nausea and vomiting.  Genitourinary:  Negative for dysuria, frequency and urgency.  Musculoskeletal:  Positive for back pain and joint pain.  Skin:  Positive for itching and rash.  Neurological:  Negative for dizziness, tingling, focal weakness, weakness and headaches.  Endo/Heme/Allergies:  Does not bruise/bleed easily.  Psychiatric/Behavioral:  Negative for depression.  The patient is not nervous/anxious and does not have insomnia.      MEDICAL HISTORY:  Past Medical History:  Diagnosis Date   Aortic atherosclerosis (HCC)    Arthritis    B12 deficiency    Bradycardia    CAD (coronary artery disease)    Carotid  atherosclerosis    CKD (chronic kidney disease), stage III (HCC)    Diastolic dysfunction 09/03/2021   a.)  TTE 09/03/2021: EF 63%; no RWMAs; LA mildly enlarged; trivial TR/PR, mild AR, moderate MR; G1DD.   DOE (dyspnea on exertion)    Fatigue    Gout    HLD (hyperlipidemia)    Hypertension    IDA (iron deficiency anemia)    Iliotibial band syndrome, left leg    MDD (major depressive disorder)    OAB (overactive bladder)    Obesity    PAC (premature atrial contraction)    noted on Holter   PONV (postoperative nausea and vomiting)    a.) single episode in 03/2013   PVC (premature ventricular contraction)    noted on Holter   Right kidney mass 10/27/2021   a.) CT 10/27/2021 -- mass measuring 8.3 x 6.8 x 8.1 cm   Sensorineural hearing loss (SNHL) of both ears    SUI (stress urinary incontinence, female)    SVT (supraventricular tachycardia)    noted on Holter   T2DM (type 2 diabetes mellitus) (HCC)    Uterovaginal prolapse    Vitamin D deficiency    Voiding dysfunction     SURGICAL HISTORY: Past Surgical History:  Procedure Laterality Date   ABDOMINAL HYSTERECTOMY     partial   BLADDER SUSPENSION     CHOLECYSTECTOMY     COLONOSCOPY N/A 07/25/2017   Procedure: COLONOSCOPY;  Surgeon: Christena Deem, MD;  Location: [facility] ENDOSCOPY;  Service: Endoscopy;  Laterality: N/A;   COLONOSCOPY WITH PROPOFOL N/A 10/19/2021   Procedure: COLONOSCOPY WITH PROPOFOL;  Surgeon: Regis Bill, MD;  Location: [facility] ENDOSCOPY;  Service: Endoscopy;  Laterality: N/A;  DM   ESOPHAGOGASTRODUODENOSCOPY (EGD) WITH PROPOFOL N/A 10/19/2021   Procedure: ESOPHAGOGASTRODUODENOSCOPY (EGD) WITH PROPOFOL;  Surgeon: Regis Bill, MD;  Location: [facility] ENDOSCOPY;  Service: Endoscopy;  Laterality: N/A;   JOINT REPLACEMENT Bilateral    PARTIAL KNEE REPLACEMENT   KNEE SURGERY     LAPAROSCOPIC NEPHRECTOMY, HAND ASSISTED Right 11/20/2021   Procedure: HAND ASSISTED LAPAROSCOPIC RADICAL NEPHRECTOMY;   Surgeon: Sondra Come, MD;  Location: [facility] ORS;  Service: Urology;  Laterality: Right;   PARATHYROIDECTOMY      SOCIAL HISTORY: Social History   Socioeconomic History   Marital status: Married    Spouse name: Not on file   Number of children: Not on file   Years of education: Not on file   Highest education level: Not on file  Occupational History   Not on file  Tobacco Use   Smoking status: Never    Passive exposure: Never   Smokeless tobacco: Never  Vaping Use   Vaping status: Never Used  Substance and Sexual Activity   Alcohol use: Not Currently    Comment: occ   Drug use: No   Sexual activity: Not Currently  Other Topics Concern   Not on file  Social History Narrative   Lives in [place] - close hospital; with husband; 3 biological children [[place]; [place]; [place]]. Never smoked; glass of wine rare. 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    Social Determinants of Health   Financial Resource Strain: Low Risk  (  04/28/2023)   Received from [facility]   Overall Financial Resource Strain (CARDIA)    Difficulty of Paying Living Expenses: Not hard at all  Food Insecurity: No Food Insecurity (04/28/2023)   Received from [facility]   Hunger Vital Sign    Worried About Running Out of Food in the Last Year: Never true    Ran Out of Food in the Last Year: Never true  Transportation Needs: No Transportation Needs (04/28/2023)   Received from [facility] - Transportation    In the past 12 months, has lack of transportation kept you from medical appointments or from getting medications?: No    Lack of Transportation (Non-Medical): No  Physical Activity: Insufficiently Active (12/07/2021)   Exercise Vital Sign    Days of Exercise per Week: 2 days    Minutes of Exercise per Session: 30 min  Stress: No Stress Concern Present (12/07/2021)   [employer] of Occupational Health - Occupational Stress  Questionnaire    Feeling of Stress : Only a little  Social Connections: Moderately Integrated (12/07/2021)   Social Connection and Isolation Panel [NHANES]    Frequency of Communication with Friends and Family: Three times a week    Frequency of Social Gatherings with Friends and Family: Three times a week    Attends Religious Services: 1 to 4 times per year    Active Member of Clubs or Organizations: No    Attends Banker Meetings: Never    Marital Status: Married  Catering manager Violence: Not At Risk (12/07/2021)   Humiliation, Afraid, Rape, and Kick questionnaire    Fear of Current or Ex-Partner: No    Emotionally Abused: No    Physically Abused: No    Sexually Abused: No    FAMILY HISTORY: Family History  Problem Relation Age of Onset   Heart failure Mother    Stroke Father    Lung cancer Father    Breast cancer Neg Hx     ALLERGIES:  is allergic to codeine, nsaids, and oxycodone.  MEDICATIONS:  Current Outpatient Medications  Medication Sig Dispense Refill   allopurinol (ZYLOPRIM) 100 MG tablet Take 300 mg by mouth every morning. Pt has decreased to 100mg      amLODipine (NORVASC) 5 MG tablet Take 5 mg by mouth every morning.     citalopram (CELEXA) 10 MG tablet Take 10 mg by mouth every morning.     ferrous sulfate 325 (65 FE) MG tablet Take 325 mg by mouth every other day.     gabapentin (NEURONTIN) 100 MG capsule Take 1 capsule (100 mg total) by mouth 2 (two) times daily. 60 capsule 1   hydrOXYzine (ATARAX) 10 MG tablet Take 1 tablet (10 mg total) by mouth at bedtime as needed. For itching. 60 tablet 1   irbesartan (AVAPRO) 150 MG tablet Take 150 mg by mouth every morning.     montelukast (SINGULAIR) 10 MG tablet TAKE 1 TABLET BY MOUTH AT BEDTIME 30 tablet 2   mupirocin ointment (BACTROBAN) 2 % Apply to skin qd-bid 22 g 0   triamcinolone cream (KENALOG) 0.1 % Apply to affected skin qd-bid prn itch, Avoid applying to face, groin, and axilla. Use as  directed. Long-term use can cause thinning of the skin. 454 g 1   triamcinolone ointment (KENALOG) 0.5 % Apply 1 Application topically 2 (two) times daily. 80 g 1   vitamin B-12 (CYANOCOBALAMIN) 1000 MCG tablet Take 1,000 mcg by  mouth daily.     docusate sodium (COLACE) 100 MG capsule Take 1 capsule (100 mg total) by mouth 2 (two) times daily. (Patient not taking: Reported on 02/14/2023) 20 capsule 0   No current facility-administered medications for this visit.      Marland Kitchen  PHYSICAL EXAMINATION:  Vitals:   05/13/23 1245  BP: (!) 174/71  Pulse: 69  Temp: 98.8 F (37.1 C)  SpO2: 95%     Filed Weights   05/13/23 1245  Weight: 216 lb 3.2 oz (98.1 kg)     Mild maculopapular rash noted in the back and also front.   Physical Exam Vitals and nursing note reviewed.  HENT:     Head: Normocephalic and atraumatic.     Mouth/Throat:     Pharynx: Oropharynx is clear.  Eyes:     Extraocular Movements: Extraocular movements intact.     Pupils: Pupils are equal, round, and reactive to light.  Cardiovascular:     Rate and Rhythm: Normal rate and regular rhythm.  Pulmonary:     Comments: Decreased breath sounds bilaterally.  Abdominal:     Palpations: Abdomen is soft.  Musculoskeletal:        General: Normal range of motion.     Cervical back: Normal range of motion.  Skin:    General: Skin is warm.  Neurological:     General: No focal deficit present.     Mental Status: She is alert and oriented to person, place, and time.  Psychiatric:        Behavior: Behavior normal.        Judgment: Judgment normal.      LABORATORY DATA:  I have reviewed the data as listed Lab Results  Component Value Date   WBC 6.3 05/13/2023   HGB 10.8 (L) 05/13/2023   HCT 36.1 05/13/2023   MCV 99.7 05/13/2023   PLT 126 (L) 05/13/2023   Recent Labs    01/11/23 0947 02/14/23 1423 02/28/23 1035 04/18/23 1414 05/13/23 1252  NA 137 137 139 139 141  K 4.3 4.8 4.5 4.6 4.7  CL 110 109 110 114*  114*  CO2 20* 22 20* 17* 21*  GLUCOSE 121* 129* 149* 140* 104*  BUN 46* 50* 72* 61* 49*  CREATININE 2.51* 2.15* 2.31* 2.11* 1.98*  CALCIUM 9.0 9.0 8.6* 8.8* 8.9  GFRNONAA 19* 23* 22* 24* 26*  PROT 6.8 7.2  --  6.5  --   ALBUMIN 4.0 3.9  --  3.9  --   AST 16 13*  --  11*  --   ALT 15 11  --  16  --   ALKPHOS 128* 137*  --  102  --   BILITOT 0.2* 0.4  --  0.5  --     RADIOGRAPHIC STUDIES: I have personally reviewed the radiological images as listed and agreed with the findings in the report. No results found.  Clear cell carcinoma of kidney, right (HCC) #Stage IIIa-clear-cell renal carcinoma-s/p  adjuvant [name] x 1 year [MAY 2024]. MAY 30th, 2024- CT CAP- non contrast- Status post interval right nephrectomy. No suspicious soft tissue in the nephrectomy bed or other noncontrast CT evidence of recurrent or metastatic disease in the chest, abdomen, or pelvis.  Will plan surveillance imaging every 6 months also. Will order today  # June 2024- CT Unchanged 0.4 cm nodule of the dependent right lower lobe, nonspecific and almost certainly benign incidental sequelae of prior infection or inflammation. Recommend follow up in 6 months.  Will order today  # Mild anemia- 9-10- check iron studies.   # Mild thrombocytopenia > 100- monitor for now.   # Skin rash/pruritus -likely secondary to [name]-- continue allegra; cerave; vaseline; atarax prn. Continue kenalog. Add singulair. Worse/ not better-  prednisone 20 mg/day; ADD neurontin- 100 mg BID. Awaiting evaluation with Dr.Kowalski/derm- consider use of omalizumab/dupilumab.   # Intermittently- elevated calcium- 10.4; STOP vit D 50k; today calcium is 10.0.  Vit D levels- 69 [SEP 2023]- stable.    # Anemia: Hemoglobin around 10.8 overall stable/improving; on PO iron every other day. NOV 2023- Iron sat-35%; ferretin- 330-stable.     #CKD stage IV- GFR ~19; Continue enough hydration. Monitor closely. [s/p Dr.Korrapati Nephrology evaluation next  week. 2023]-stable.   #Diabetes: patient OFF metformin;/glimepiride [as per nephrology]-monitor blood sugars closely at home.   # IV ACCESS: PIV.    # DISPOSITION: # follow up in MD; 3 months- labs-cbc/cmp; iron studies; ferritin; LDH; vit D25 OH  CT CAP prior- Dr.B   All questions were answered. The patient knows to call the clinic with any problems, questions or concerns.   [name], MD 05/13/2023 2:01 PM

## 2023-05-13 NOTE — Progress Notes (Signed)
Dermatology gave her doxycycline, bactroban ointment and kenalog cream. Had 2 bx's last week and this week.

## 2023-05-14 ENCOUNTER — Other Ambulatory Visit: Payer: Self-pay

## 2023-05-16 ENCOUNTER — Other Ambulatory Visit: Payer: Self-pay | Admitting: Internal Medicine

## 2023-05-16 ENCOUNTER — Other Ambulatory Visit: Payer: Self-pay | Admitting: Dermatology

## 2023-05-17 ENCOUNTER — Encounter: Payer: Self-pay | Admitting: Internal Medicine

## 2023-05-18 ENCOUNTER — Ambulatory Visit (INDEPENDENT_AMBULATORY_CARE_PROVIDER_SITE_OTHER): Payer: Medicare Other | Admitting: Dermatology

## 2023-05-18 ENCOUNTER — Encounter: Payer: Self-pay | Admitting: Dermatology

## 2023-05-18 VITALS — BP 141/67

## 2023-05-18 DIAGNOSIS — R768 Other specified abnormal immunological findings in serum: Secondary | ICD-10-CM | POA: Diagnosis not present

## 2023-05-18 DIAGNOSIS — R21 Rash and other nonspecific skin eruption: Secondary | ICD-10-CM | POA: Diagnosis not present

## 2023-05-18 DIAGNOSIS — L820 Inflamed seborrheic keratosis: Secondary | ICD-10-CM

## 2023-05-18 DIAGNOSIS — Z7189 Other specified counseling: Secondary | ICD-10-CM

## 2023-05-18 NOTE — Patient Instructions (Addendum)

## 2023-05-18 NOTE — Progress Notes (Signed)
   Follow-Up Visit   Subjective  Gabrielle Savage is a 77 y.o. female who presents for the following: bx f/u, pt feels rash has improved, does have fatigue and some muscle cramps The patient has spots, moles and lesions to be evaluated, some may be new or changing and the patient may have concern these could be cancer.  The following portions of the chart were reviewed this encounter and updated as appropriate: medications, allergies, medical history  Review of Systems:  No other skin or systemic complaints except as noted in HPI or Assessment and Plan.  Objective  Well appearing patient in no apparent distress; mood and affect are within normal limits.  A focused examination was performed of the following areas: Face, back, arms  Relevant exam findings are noted in the Assessment and Plan.  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 For Kidney CA) vs lupus vs dermatomyositis vs other   Labs  and pathology from 05/12/23 viewed and discussed with patient Exam: few scattered excoriations arms, trunk  Chronic and persistent condition with duration or expected duration over one year. Condition is improving with treatment but not currently at goal.  Treatment Plan: Recommend referral to rheumatology for further evaluation Cont TMC 0.1% cr qd/bid up to 5d/wk aa rash until clear, then prn flares, avoid f/g/a Start Allegra or Claritin daily  Topical steroids (such as triamcinolone, fluocinolone, fluocinonide, mometasone, clobetasol, halobetasol, betamethasone, hydrocortisone) can cause thinning and lightening of the skin if they are used for too long in the same area. Your physician has selected the right strength medicine for your problem and area affected on the body. Please use your  medication only as directed by your physician to prevent side effects.    Inflamed seborrheic keratosis L cheek x 1  Symptomatic, irritating, patient would like treated.  Destruction of lesion - L cheek x 1 Complexity: simple   Destruction method: cryotherapy   Informed consent: discussed and consent obtained   Timeout:  patient name, date of birth, surgical site, and procedure verified Lesion destroyed using liquid nitrogen: Yes   Region frozen until ice ball extended beyond lesion: Yes   Outcome: patient tolerated procedure well with no complications   Post-procedure details: wound care instructions given    Rash and other nonspecific skin eruption  Related Procedures Ambulatory referral to Rheumatology  Counseling and coordination of care  Positive ANA (antinuclear antibody)   Return in about 2 months (around 07/18/2023) for f/u rash, ISk f/u.  I, Ardis Rowan, RMA, am acting as scribe for Armida Sans, MD .  Documentation: I have reviewed the above documentation for accuracy and completeness, and I agree with the above.  Armida Sans, MD

## 2023-05-19 ENCOUNTER — Ambulatory Visit: Payer: Medicare Other | Admitting: Dermatology

## 2023-06-06 ENCOUNTER — Other Ambulatory Visit: Payer: Self-pay | Admitting: Dermatology

## 2023-06-08 ENCOUNTER — Telehealth: Payer: Self-pay

## 2023-06-08 NOTE — Telephone Encounter (Signed)
Left message on voicemail to return my call.

## 2023-06-11 ENCOUNTER — Other Ambulatory Visit: Payer: Self-pay | Admitting: Internal Medicine

## 2023-06-13 ENCOUNTER — Ambulatory Visit
Admission: RE | Admit: 2023-06-13 | Discharge: 2023-06-13 | Disposition: A | Discharge status: Home / Self Care | Payer: Medicare Other | Source: Ambulatory Visit | Attending: Internal Medicine | Admitting: Internal Medicine

## 2023-06-13 ENCOUNTER — Encounter: Payer: Self-pay | Admitting: Internal Medicine

## 2023-06-13 DIAGNOSIS — Z1231 Encounter for screening mammogram for malignant neoplasm of breast: Secondary | ICD-10-CM | POA: Diagnosis present

## 2023-06-22 ENCOUNTER — Ambulatory Visit: Payer: Medicare Other | Admitting: Urology

## 2023-06-23 ENCOUNTER — Ambulatory Visit: Payer: Medicare Other | Admitting: Urology

## 2023-06-23 ENCOUNTER — Other Ambulatory Visit: Payer: Self-pay

## 2023-06-23 ENCOUNTER — Encounter: Payer: Self-pay | Admitting: Urology

## 2023-06-23 VITALS — BP 153/76 | HR 72 | Ht 61.0 in | Wt 216.0 lb

## 2023-06-23 DIAGNOSIS — C649 Malignant neoplasm of unspecified kidney, except renal pelvis: Secondary | ICD-10-CM

## 2023-06-23 DIAGNOSIS — Z08 Encounter for follow-up examination after completed treatment for malignant neoplasm: Secondary | ICD-10-CM | POA: Diagnosis not present

## 2023-06-23 DIAGNOSIS — Z85528 Personal history of other malignant neoplasm of kidney: Secondary | ICD-10-CM | POA: Diagnosis not present

## 2023-06-23 NOTE — Progress Notes (Signed)
06/23/2023 1:39 PM   Gabrielle Savage 19-Oct-1945 [id number]  Reason for visit: Follow up kidney cancer, CKD  HPI: 77 year old female who presented with a 8cm right renal mass worrisome for RCC, and some possible small retroperitoneal lymph nodes that were indeterminate, as well as an indeterminate 4 mm right lower lobe subpleural nodule.  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  She follows with Dr. Donneta Romberg with oncology, and received 1 year adjuvant [name], completed May 2024.  She has had no evidence of recurrence, I personally viewed and interpreted the CT chest, abdomen, and pelvis from June 2024 that shows NED.  She overall is doing well.  She is having some bothersome itching being evaluated by dermatology rheumatology but otherwise denies any complaints.  Renal function stable with creatinine of 1.98 (eGFR 26) which is stable over the last year. She follows with nephrology.  We discussed importance of adequate hydration, blood pressure control, and avoiding NSAIDs.  Continue oncology follow-up for surveillance imaging Can follow-up with urology as needed at this point  Sondra Come, MD  [facility] [address], [address] [place], [place] [address] [phone]

## 2023-07-04 ENCOUNTER — Other Ambulatory Visit: Payer: Self-pay | Admitting: Internal Medicine

## 2023-07-04 ENCOUNTER — Other Ambulatory Visit: Payer: Self-pay

## 2023-07-04 DIAGNOSIS — C641 Malignant neoplasm of right kidney, except renal pelvis: Secondary | ICD-10-CM

## 2023-07-04 MED ORDER — TRIAMCINOLONE ACETONIDE 0.1 % EX CREA: TOPICAL_CREAM | CUTANEOUS | 0 refills | Status: DC

## 2023-07-07 ENCOUNTER — Other Ambulatory Visit: Payer: Self-pay | Admitting: Internal Medicine

## 2023-07-08 ENCOUNTER — Encounter: Payer: Self-pay | Admitting: Internal Medicine

## 2023-07-26 ENCOUNTER — Other Ambulatory Visit: Payer: Self-pay | Admitting: Internal Medicine

## 2023-07-27 ENCOUNTER — Ambulatory Visit: Payer: Medicare Other | Admitting: Dermatology

## 2023-07-27 ENCOUNTER — Encounter: Payer: Self-pay | Admitting: Internal Medicine

## 2023-07-27 DIAGNOSIS — L820 Inflamed seborrheic keratosis: Secondary | ICD-10-CM

## 2023-07-27 DIAGNOSIS — D492 Neoplasm of unspecified behavior of bone, soft tissue, and skin: Secondary | ICD-10-CM

## 2023-07-27 DIAGNOSIS — D2371 Other benign neoplasm of skin of right lower limb, including hip: Secondary | ICD-10-CM

## 2023-07-27 DIAGNOSIS — R21 Rash and other nonspecific skin eruption: Secondary | ICD-10-CM | POA: Diagnosis not present

## 2023-07-27 DIAGNOSIS — L308 Other specified dermatitis: Secondary | ICD-10-CM

## 2023-07-27 DIAGNOSIS — L821 Other seborrheic keratosis: Secondary | ICD-10-CM

## 2023-07-27 DIAGNOSIS — D489 Neoplasm of uncertain behavior, unspecified: Secondary | ICD-10-CM

## 2023-07-27 DIAGNOSIS — Z79899 Other long term (current) drug therapy: Secondary | ICD-10-CM

## 2023-07-27 DIAGNOSIS — L578 Other skin changes due to chronic exposure to nonionizing radiation: Secondary | ICD-10-CM

## 2023-07-27 DIAGNOSIS — W908XXA Exposure to other nonionizing radiation, initial encounter: Secondary | ICD-10-CM

## 2023-07-27 DIAGNOSIS — Z7189 Other specified counseling: Secondary | ICD-10-CM

## 2023-07-27 NOTE — Patient Instructions (Addendum)
Recommend to keep follow up with Rheumatologist For rash  Continue with prescribed treatment    Biopsy Wound Care Instructions  Leave the original bandage on for 24 hours if possible.  If the bandage becomes soaked or soiled before that time, it is OK to remove it and examine the wound.  A small amount of post-operative bleeding is normal.  If excessive bleeding occurs, remove the bandage, place gauze over the site and apply continuous pressure (no peeking) over the area for 30 minutes. If this does not work, please call our clinic as soon as possible or page your doctor if it is after hours.   Once a day, cleanse the wound with soap and water. It is fine to shower. If a thick crust develops you may use a Q-tip dipped into dilute hydrogen peroxide (mix 1:1 with water) to dissolve it.  Hydrogen peroxide can slow the healing process, so use it only as needed.    After washing, apply petroleum jelly (Vaseline) or an antibiotic ointment if your doctor prescribed one for you, followed by a bandage.    For best healing, the wound should be covered with a layer of ointment at all times. If you are not able to keep the area covered with a bandage to hold the ointment in place, this may mean re-applying the ointment several times a day.  Continue this wound care until the wound has healed and is no longer open.   Itching and mild discomfort is normal during the healing process. However, if you develop pain or severe itching, please call our office.   If you have any discomfort, you can take Tylenol (acetaminophen) or ibuprofen as directed on the bottle. (Please do not take these if you have an allergy to them or cannot take them for another reason).  Some redness, tenderness and white or yellow material in the wound is normal healing.  If the area becomes very sore and red, or develops a thick yellow-green material (pus), it may be infected; please notify us.    If you have stitches, return to clinic as  directed to have the stitches removed. You will continue wound care for 2-3 days after the stitches are removed.   Wound healing continues for up to one year following surgery. It is not unusual to experience pain in the scar from time to time during the interval.  If the pain becomes severe or the scar thickens, you should notify the office.    A slight amount of redness in a scar is expected for the first six months.  After six months, the redness will fade and the scar will soften and fade.  The color difference becomes less noticeable with time.  If there are any problems, return for a post-op surgery check at your earliest convenience.  To improve the appearance of the scar, you can use silicone scar gel, cream, or sheets (such as Mederma or Serica) every night for up to one year. These are available over the counter (without a prescription).  Please call our office at [phone] for any questions or concerns.       Seborrheic Keratosis  What causes seborrheic keratoses? Seborrheic keratoses are harmless, common skin growths that first appear during adult life.  As time goes by, more growths appear.  Some people may develop a large number of them.  Seborrheic keratoses appear on both covered and uncovered body parts.  They are not caused by sunlight.  The tendency to develop seborrheic  keratoses can be inherited.  They vary in color from skin-colored to gray, brown, or even black.  They can be either smooth or have a rough, warty surface.   Seborrheic keratoses are superficial and look as if they were stuck on the skin.  Under the microscope this type of keratosis looks like layers upon layers of skin.  That is why at times the top layer may seem to fall off, but the rest of the growth remains and re-grows.    Treatment Seborrheic keratoses do not need to be treated, but can easily be removed in the office.  Seborrheic keratoses often cause symptoms when they rub on clothing or jewelry.   Lesions can be in the way of shaving.  If they become inflamed, they can cause itching, soreness, or burning.  Removal of a seborrheic keratosis can be accomplished by freezing, burning, or surgery. If any spot bleeds, scabs, or grows rapidly, please return to have it checked, as these can be an indication of a skin cancer.   Cryotherapy Aftercare  Wash gently with soap and water everyday.   Apply Vaseline and Band-Aid daily until healed.    Due to recent changes in healthcare laws, you may see results of your pathology and/or laboratory studies on MyChart before the doctors have had a chance to review them. We understand that in some cases there may be results that are confusing or concerning to you. Please understand that not all results are received at the same time and often the doctors may need to interpret multiple results in order to provide you with the best plan of care or course of treatment. Therefore, we ask that you please give [place] 2 business days to thoroughly review all your results before contacting the office for clarification. Should we see a critical lab result, you will be contacted sooner.   If You Need Anything After Your Visit  If you have any questions or concerns for your doctor, please call our main line at [phone] and press option 4 to reach your doctor's medical assistant. If no one answers, please leave a voicemail as directed and we will return your call as soon as possible. Messages left after 4 pm will be answered the following business day.   You may also send [place] a message via MyChart. We typically respond to MyChart messages within 1-2 business days.  For prescription refills, please ask your pharmacy to contact our office. Our fax number is [phone].  If you have an urgent issue when the clinic is closed that cannot wait until the next business day, you can page your doctor at the number below.    Please note that while we do our best to be available  for urgent issues outside of office hours, we are not available 24/7.   If you have an urgent issue and are unable to reach [place], you may choose to seek medical care at your doctor's office, retail clinic, urgent care center, or emergency room.  If you have a medical emergency, please immediately call 911 or go to the emergency department.  Pager Numbers  - Dr. Gwen Pounds: [phone]  - Dr. Roseanne Reno: [phone]  - Dr. Katrinka Blazing: [phone]   In the event of inclement weather, please call our main line at [phone] for an update on the status of any delays or closures.  Dermatology Medication Tips: Please keep the boxes that topical medications come in in order to help keep track of the instructions about where and  how to use these. Pharmacies typically print the medication instructions only on the boxes and not directly on the medication tubes.   If your medication is too expensive, please contact our office at [phone] option 4 or send [place] a message through MyChart.   We are unable to tell what your co-pay for medications will be in advance as this is different depending on your insurance coverage. However, we may be able to find a substitute medication at lower cost or fill out paperwork to get insurance to cover a needed medication.   If a prior authorization is required to get your medication covered by your insurance company, please allow [place] 1-2 business days to complete this process.  Drug prices often vary depending on where the prescription is filled and some pharmacies may offer cheaper prices.  The website www.goodrx.com contains coupons for medications through different pharmacies. The prices here do not account for what the cost may be with help from insurance (it may be cheaper with your insurance), but the website can give you the price if you did not use any insurance.  - You can print the associated coupon and take it with your prescription to the pharmacy.  - You may  also stop by our office during regular business hours and pick up a GoodRx coupon card.  - If you need your prescription sent electronically to a different pharmacy, notify our office through [facility] or by phone at [phone] option 4.     Si Usted Necesita Algo Despus de Su Visita  Tambin puede enviarnos un mensaje a travs de Clinical cytogeneticist. Por lo general respondemos a los mensajes de MyChart en el transcurso de 1 a 2 das hbiles.  Para renovar recetas, por favor pida a su farmacia que se ponga en contacto con nuestra oficina. Annie Sable de fax es [place] [phone].  Si tiene un asunto urgente cuando la clnica est cerrada y que no puede esperar hasta el siguiente da hbil, puede llamar/localizar a su doctor(a) al nmero que aparece a continuacin.   Por favor, tenga en cuenta que aunque hacemos todo lo posible para estar disponibles para asuntos urgentes fuera del horario de [place], no estamos disponibles las 24 horas del da, los 7 [address]  [address] de la [place].   Si tiene un problema urgente y no puede comunicarse con nosotros, puede optar por buscar atencin mdica  en el consultorio de su doctor(a), en una clnica privada, en un centro de atencin urgente o en una sala de emergencias.  Si tiene Engineer, drilling, por favor llame inmediatamente al 911 o vaya a la sala de emergencias.  Nmeros de bper  - Dr. Gwen Pounds: [phone]  - Dra. Roseanne Reno: [phone]  - Dr. Katrinka Blazing: [phone]   En caso de inclemencias del tiempo, por favor llame a Lacy Duverney principal al [phone] para una actualizacin sobre el [place] de cualquier retraso o cierre.  Consejos para la medicacin en dermatologa: Por favor, guarde las cajas en las que vienen los medicamentos de uso tpico para ayudarle a seguir las instrucciones sobre dnde y cmo usarlos. Las farmacias generalmente imprimen las instrucciones del medicamento slo en las cajas y no directamente en los tubos del [place].    Si su medicamento es muy caro, por favor, pngase en contacto con Rolm Gala llamando al [phone] y presione la opcin 4 o envenos un mensaje a travs de Clinical cytogeneticist.   No podemos decirle cul ser su copago por los medicamentos por adelantado ya que esto es  diferente dependiendo de la cobertura de su seguro. Sin embargo, es posible que podamos encontrar un medicamento sustituto a Audiological scientist un formulario para que el seguro cubra el medicamento que se considera necesario.   Si se requiere una autorizacin previa para que su compaa de seguros [place] su medicamento, por favor permtanos de 1 a 2 das hbiles para completar [address]  Los precios de los medicamentos varan con frecuencia dependiendo del Environmental consultant de dnde se surte la receta y alguna farmacias pueden ofrecer precios ms baratos.  El sitio web www.goodrx.com tiene cupones para medicamentos de Health and safety inspector. Los precios aqu no tienen en cuenta lo que podra costar con la ayuda del seguro (puede ser ms barato con su seguro), pero el sitio web puede darle el precio si no utiliz Tourist information centre manager.  - Puede imprimir el cupn correspondiente y llevarlo con su receta a la farmacia.  - Tambin puede pasar por nuestra oficina durante el horario de atencin regular y Education officer, museum una tarjeta de cupones de GoodRx.  - Si necesita que su receta se enve electrnicamente a una farmacia diferente, informe a nuestra oficina a travs de MyChart de [place] o por telfono llamando al [phone] y presione la opcin 4.

## 2023-07-27 NOTE — Progress Notes (Unsigned)
Follow-Up Visit   Subjective  Gabrielle Savage is a 77 y.o. female who presents for the following: Patient here for 2 month follow up for rash at arms and trunk. Patient reports seen by Dr. Allena Katz on 07/04/2023, labs checked, and prescribed Plaquenil 200 mg tab and prednisone. Reports treatment has helped some with itching.   The patient has spots, moles and lesions to be evaluated, some may be new or changing and the patient may have concern these could be cancer.   The following portions of the chart were reviewed this encounter and updated as appropriate: medications, allergies, medical history  Review of Systems:  No other skin or systemic complaints except as noted in HPI or Assessment and Plan.  Objective  Well appearing patient in no apparent distress; mood and affect are within normal limits.  A focused examination was performed of the following areas: Back, right leg, left cheek  Relevant exam findings are noted in the Assessment and Plan.             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 Eliane Decree visit notes  Exam: scattered excoriation on back photos today    Chronic and persistent condition with duration or expected duration over one year. Condition is improving with treatment but not currently at goal.   Treatment Plan: Continue follow up with Rheumatology   Continue Plaquenil as prescribed by Dr Allena Katz. Cont TMC 0.1% cr qd/bid up to 5d/wk aa rash until clear, then prn flares, avoid f/g/a Cont Allegra or Claritin daily   Topical steroids (such as triamcinolone, fluocinolone, fluocinonide, mometasone,  clobetasol, halobetasol, betamethasone, hydrocortisone) can cause thinning and lightening of the skin if they are used for too long in the same area. Your physician has selected the right strength medicine for your problem and area affected on the body. Please use your medication only as directed by your physician to prevent side effects.    SEBORRHEIC KERATOSIS - Stuck-on, waxy, tan-brown papules and/or plaques  - Benign-appearing - Discussed benign etiology and prognosis. - Observe - Call for any changes  ACTINIC DAMAGE - chronic, secondary to cumulative UV radiation exposure/sun exposure over time - diffuse scaly erythematous macules with underlying dyspigmentation - Recommend daily broad spectrum sunscreen SPF 30+ to sun-exposed areas, reapply every 2 hours as needed.  - Recommend staying in the shade or wearing long sleeves, sun glasses (UVA+UVB protection) and wide brim hats (4-inch brim around the entire circumference of the hat). - Call for new or changing lesions.  Inflamed seborrheic keratosis left cheek x 1  Symptomatic, irritating, patient would like treated.  Destruction of lesion - left cheek x 1 Complexity: simple   Destruction method: cryotherapy   Informed consent: discussed and consent obtained   Timeout:  patient name, date of birth, surgical site, and procedure verified Lesion destroyed using liquid nitrogen: Yes   Region frozen until ice ball extended beyond lesion: Yes   Outcome: patient tolerated procedure well with no complications   Post-procedure details: wound care instructions given    Neoplasm of uncertain behavior right lateral pretibial  Epidermal / dermal shaving  Lesion diameter (cm):  0.7 Informed consent: discussed and consent obtained   Timeout: patient name, date of birth, surgical site, and procedure verified   Procedure prep:  Patient was prepped and draped in usual sterile fashion Prep type:  Isopropyl alcohol Anesthesia: the lesion was  anesthetized in a standard fashion   Anesthetic:  1% lidocaine w/ epinephrine 1-100,000 buffered w/ 8.4% NaHCO3 Instrument used: flexible razor blade   Hemostasis achieved with: pressure, aluminum chloride and electrodesiccation   Outcome: patient tolerated procedure well   Post-procedure details: sterile dressing applied and wound care instructions given   Dressing type: bandage and petrolatum    Destruction of lesion Complexity: extensive   Destruction method: electrodesiccation and curettage   Informed consent: discussed and consent obtained   Timeout:  patient name, date of birth, surgical site, and procedure verified Procedure prep:  Patient was prepped and draped in usual sterile fashion Prep type:  Isopropyl alcohol Anesthesia: the lesion was anesthetized in a standard fashion   Anesthetic:  1% lidocaine w/ epinephrine 1-100,000 buffered w/ 8.4% NaHCO3 Curettage performed in three different directions: Yes   Electrodesiccation performed over the curetted area: Yes   Lesion length (cm):  0.7 Lesion width (cm):  0.7 Margin per side (cm):  0.2 Final wound size (cm):  1.1 Hemostasis achieved with:  pressure, aluminum chloride and electrodesiccation Outcome: patient tolerated procedure well with no complications   Post-procedure details: sterile dressing applied and wound care instructions given   Dressing type: bandage and petrolatum    Specimen 1 - Surgical pathology Differential Diagnosis: R/o dermatofibroma vs other 2 pieces Check Margins: No  R/o dermatofibroma vs other    Return for schedule patient for March follo .  IAsher Muir, CMA, am acting as scribe for Armida Sans, MD.   Documentation: I have reviewed the above documentation for accuracy and completeness, and I agree with the above.  Armida Sans, MD

## 2023-07-28 ENCOUNTER — Other Ambulatory Visit: Payer: Self-pay

## 2023-07-29 LAB — SURGICAL PATHOLOGY

## 2023-08-01 ENCOUNTER — Telehealth: Payer: Self-pay

## 2023-08-01 NOTE — Telephone Encounter (Addendum)
Tried calling patient regarding bx results. No answer. LM for patient to return call.    ----- Message from Armida Sans sent at 07/30/2023  4:12 PM EST ----- FINAL DIAGNOSIS        1. 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 May recur No further treatment at this time Recheck next visit

## 2023-08-03 ENCOUNTER — Encounter: Payer: Self-pay | Admitting: Dermatology

## 2023-08-04 ENCOUNTER — Telehealth: Payer: Self-pay

## 2023-08-04 NOTE — Telephone Encounter (Signed)
Pt informed of pathology results via MyChart and voiced no questions at this time.

## 2023-08-04 NOTE — Telephone Encounter (Signed)
-----   Message from Armida Sans sent at 07/30/2023  4:12 PM EST ----- FINAL DIAGNOSIS        1. 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 May recur No further treatment at this time Recheck next visit

## 2023-08-05 ENCOUNTER — Other Ambulatory Visit: Payer: Self-pay | Admitting: Hospice and Palliative Medicine

## 2023-08-08 ENCOUNTER — Ambulatory Visit
Admission: RE | Admit: 2023-08-08 | Discharge: 2023-08-08 | Disposition: A | Discharge status: Home / Self Care | Payer: Medicare Other | Source: Ambulatory Visit | Attending: Internal Medicine | Admitting: Internal Medicine

## 2023-08-08 DIAGNOSIS — C641 Malignant neoplasm of right kidney, except renal pelvis: Secondary | ICD-10-CM | POA: Diagnosis present

## 2023-08-09 ENCOUNTER — Other Ambulatory Visit: Payer: Self-pay | Admitting: Internal Medicine

## 2023-08-10 ENCOUNTER — Encounter: Payer: Self-pay | Admitting: Internal Medicine

## 2023-08-16 ENCOUNTER — Encounter: Payer: Self-pay | Admitting: Internal Medicine

## 2023-08-16 ENCOUNTER — Inpatient Hospital Stay (HOSPITAL_BASED_OUTPATIENT_CLINIC_OR_DEPARTMENT_OTHER): Payer: Medicare Other | Admitting: Internal Medicine

## 2023-08-16 ENCOUNTER — Inpatient Hospital Stay: Payer: Medicare Other | Attending: Internal Medicine

## 2023-08-16 VITALS — BP 122/59 | HR 67 | Temp 97.5°F | Resp 18 | Wt 212.2 lb

## 2023-08-16 DIAGNOSIS — I129 Hypertensive chronic kidney disease with stage 1 through stage 4 chronic kidney disease, or unspecified chronic kidney disease: Secondary | ICD-10-CM | POA: Insufficient documentation

## 2023-08-16 DIAGNOSIS — N184 Chronic kidney disease, stage 4 (severe): Secondary | ICD-10-CM | POA: Insufficient documentation

## 2023-08-16 DIAGNOSIS — Z801 Family history of malignant neoplasm of trachea, bronchus and lung: Secondary | ICD-10-CM | POA: Insufficient documentation

## 2023-08-16 DIAGNOSIS — Z79899 Other long term (current) drug therapy: Secondary | ICD-10-CM | POA: Insufficient documentation

## 2023-08-16 DIAGNOSIS — E559 Vitamin D deficiency, unspecified: Secondary | ICD-10-CM

## 2023-08-16 DIAGNOSIS — E1122 Type 2 diabetes mellitus with diabetic chronic kidney disease: Secondary | ICD-10-CM | POA: Diagnosis not present

## 2023-08-16 DIAGNOSIS — C641 Malignant neoplasm of right kidney, except renal pelvis: Secondary | ICD-10-CM | POA: Insufficient documentation

## 2023-08-16 DIAGNOSIS — D649 Anemia, unspecified: Secondary | ICD-10-CM | POA: Insufficient documentation

## 2023-08-16 DIAGNOSIS — Z905 Acquired absence of kidney: Secondary | ICD-10-CM | POA: Diagnosis not present

## 2023-08-16 DIAGNOSIS — L299 Pruritus, unspecified: Secondary | ICD-10-CM | POA: Diagnosis not present

## 2023-08-16 DIAGNOSIS — D696 Thrombocytopenia, unspecified: Secondary | ICD-10-CM | POA: Insufficient documentation

## 2023-08-16 LAB — CBC WITH DIFFERENTIAL (CANCER CENTER ONLY)
Abs Immature Granulocytes: 0.06 10*3/uL (ref 0.00–0.07)
Basophils Absolute: 0.1 10*3/uL (ref 0.0–0.1)
Basophils Relative: 1 %
Eosinophils Absolute: 1.3 10*3/uL — ABNORMAL HIGH (ref 0.0–0.5)
Eosinophils Relative: 18 %
HCT: 40.5 % (ref 36.0–46.0)
Hemoglobin: 12.3 g/dL (ref 12.0–15.0)
Immature Granulocytes: 1 %
Lymphocytes Relative: 16 %
Lymphs Abs: 1.2 10*3/uL (ref 0.7–4.0)
MCH: 29.6 pg (ref 26.0–34.0)
MCHC: 30.4 g/dL (ref 30.0–36.0)
MCV: 97.6 fL (ref 80.0–100.0)
Monocytes Absolute: 0.7 10*3/uL (ref 0.1–1.0)
Monocytes Relative: 10 %
Neutro Abs: 3.8 10*3/uL (ref 1.7–7.7)
Neutrophils Relative %: 54 %
Platelet Count: 215 10*3/uL (ref 150–400)
RBC: 4.15 MIL/uL (ref 3.87–5.11)
RDW: 13.5 % (ref 11.5–15.5)
WBC Count: 7.1 10*3/uL (ref 4.0–10.5)
nRBC: 0 % (ref 0.0–0.2)

## 2023-08-16 LAB — CMP (CANCER CENTER ONLY)
ALT: 15 U/L (ref 0–44)
AST: 16 U/L (ref 15–41)
Albumin: 3.9 g/dL (ref 3.5–5.0)
Alkaline Phosphatase: 136 U/L — ABNORMAL HIGH (ref 38–126)
Anion gap: 11 (ref 5–15)
BUN: 43 mg/dL — ABNORMAL HIGH (ref 8–23)
CO2: 22 mmol/L (ref 22–32)
Calcium: 9.1 mg/dL (ref 8.9–10.3)
Chloride: 111 mmol/L (ref 98–111)
Creatinine: 2.73 mg/dL — ABNORMAL HIGH (ref 0.44–1.00)
GFR, Estimated: 18 mL/min — ABNORMAL LOW (ref >60)
Glucose, Bld: 140 mg/dL — ABNORMAL HIGH (ref 70–99)
Potassium: 4.5 mmol/L (ref 3.5–5.1)
Sodium: 144 mmol/L (ref 135–145)
Total Bilirubin: 0.5 mg/dL (ref <1.2)
Total Protein: 6.8 g/dL (ref 6.5–8.1)

## 2023-08-16 LAB — FERRITIN: Ferritin: 277 ng/mL (ref 11–307)

## 2023-08-16 LAB — IRON AND TIBC
Iron: 62 ug/dL (ref 28–170)
Saturation Ratios: 22 % (ref 10.4–31.8)
TIBC: 287 ug/dL (ref 250–450)
UIBC: 225 ug/dL

## 2023-08-16 LAB — LACTATE DEHYDROGENASE: LDH: 250 U/L — ABNORMAL HIGH (ref 98–192)

## 2023-08-16 LAB — VITAMIN D 25 HYDROXY (VIT D DEFICIENCY, FRACTURES): Vit D, 25-Hydroxy: 36.11 ng/mL (ref 30–100)

## 2023-08-16 MED ORDER — PREDNISONE 10 MG PO TABS: ORAL_TABLET | ORAL | 0 refills | Status: DC

## 2023-08-16 NOTE — Progress Notes (Signed)
Pt feels weak today. Has been fatigued. No blood in urine. Denies any pain. The itching "all over" is driving her crazy, she scratches until sores appear. The itching is on trunk and limbs. Tried ointments, benadryl and nothing has worked. Neosporin application when sores appear. She states the itching started after taking [name] along with neuropathy.

## 2023-08-16 NOTE — Progress Notes (Signed)
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  Histologic Grade (WHO / ISUP Grade): G3, nucleoli conspicuous and  eosinophilic at 100 X magnification  Tumor Extent: Extends into major vein (segmental branch adjacent to  renal sinus)  Sarcomatoid Features: Not identified  Rhabdoid Features: Not identified  Tumor Necrosis: Present, microscopic and macroscopic, 20%   MARGINS  Margin Status: All margins negative for invasive carcinoma   REGIONAL LYMPH NODES  Regional Lymph Node Status: Not applicable (no regional lymph nodes  submitted or found)   DISTANT METASTASIS  Distant Site(s) Involved, if applicable: Not applicable   PATHOLOGIC STAGE CLASSIFICATION (pTNM, AJCC 8th Edition):  TNM Descriptors: Applicable  pT3a  pN - Not assigned (no lymph nodes submitted or found)  pM - Not applicable   # PT3A- stage III; G-3; no sarcomatoid features [right nephrectomy- Dr.Sninksi].  # FEB 14th, 2023- CT AP [GI; [facility]]- . Large mass exophytic extending from lower to mid pole of the RIGHT kidney consistent with renal neoplasm. [Dr.Sninski; Uorlogy]  # FEB 2023- Right lower lobe subpleural 4 mm lung nodule; bone survey negative  #April 7th, 2023- On adjuvant [name]    # CKD III [ sec to HTN > 20 years; none now]; Anemia- EGD/colo- 2023- [[facility]]; Dr.Kowalski [No CAD]   Clear cell carcinoma of kidney, right (HCC)  12/12/2021 Initial Diagnosis   Clear cell renal cell carcinoma, right (HCC)   12/18/2021 - 04/05/2022 Chemotherapy   Patient is on Treatment Plan : RENAL CELL Pembrolizumab (200) q21d     12/18/2021 -  Chemotherapy   Patient is on Treatment  Plan : kidney adjuvant Pembrolizumab (200) q21d     01/08/2022 Cancer Staging   Staging form: Kidney, AJCC 8th Edition - Pathologic: Stage III (pT3a, pN0, cM0) - Signed by [name], MD on 01/08/2022     HISTORY OF PRESENTING ILLNESS: Ambulating independently.  Accompanied by husband.  Gabrielle Savage 77 y.o.  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   Patient also status post evaluation with rheumatology.  Patient continues to feels fatigued. No blood in urine. Denies any pain.   Patient continues to have the itching "all over" . she scratches until sores appear. The itching is on trunk and limbs. Tried ointments, benadryl and nothing has worked  Patient currently off prednisone. No worsening cough.  Positive for weight gain.  Mild swelling in the legs.  Denies any worsening joint pains.  No nausea no vomiting. Complains of swelling in legs.   Review of Systems  Constitutional:  Positive for malaise/fatigue. Negative for chills and fever.  HENT:  Negative for nosebleeds and sore throat.   Eyes:  Negative for double vision.  Respiratory:  Negative for cough, hemoptysis, sputum production and wheezing.   Cardiovascular:  Negative for chest pain, palpitations, orthopnea and leg swelling.  Gastrointestinal:  Negative for abdominal pain, blood in stool, constipation, diarrhea, heartburn, melena, nausea and vomiting.  Genitourinary:  Negative for dysuria, frequency and urgency.  Musculoskeletal:  Positive for back pain and joint pain.  Skin:  Positive for itching  and rash.  Neurological:  Negative for dizziness, tingling, focal weakness, weakness and headaches.  Endo/Heme/Allergies:  Does not bruise/bleed easily.  Psychiatric/Behavioral:  Negative for depression. The patient is not nervous/anxious and does not have insomnia.      MEDICAL  HISTORY:  Past Medical History:  Diagnosis Date   Aortic atherosclerosis (HCC)    Arthritis    B12 deficiency    Bradycardia    CAD (coronary artery disease)    Carotid atherosclerosis    CKD (chronic kidney disease), stage III (HCC)    Diastolic dysfunction 09/03/2021   a.)  TTE 09/03/2021: EF 63%; no RWMAs; LA mildly enlarged; trivial TR/PR, mild AR, moderate MR; G1DD.   DOE (dyspnea on exertion)    Fatigue    Gout    HLD (hyperlipidemia)    Hypertension    IDA (iron deficiency anemia)    Iliotibial band syndrome, left leg    MDD (major depressive disorder)    OAB (overactive bladder)    Obesity    PAC (premature atrial contraction)    noted on Holter   PONV (postoperative nausea and vomiting)    a.) single episode in 03/2013   PVC (premature ventricular contraction)    noted on Holter   Right kidney mass 10/27/2021   a.) CT 10/27/2021 -- mass measuring 8.3 x 6.8 x 8.1 cm   Sensorineural hearing loss (SNHL) of both ears    SUI (stress urinary incontinence, female)    SVT (supraventricular tachycardia) (HCC)    noted on Holter   T2DM (type 2 diabetes mellitus) (HCC)    Uterovaginal prolapse    Vitamin D deficiency    Voiding dysfunction     SURGICAL HISTORY: Past Surgical History:  Procedure Laterality Date   ABDOMINAL HYSTERECTOMY     partial   BLADDER SUSPENSION     CHOLECYSTECTOMY     COLONOSCOPY N/A 07/25/2017   Procedure: COLONOSCOPY;  Surgeon: Christena Deem, MD;  Location: [facility] ENDOSCOPY;  Service: Endoscopy;  Laterality: N/A;   COLONOSCOPY WITH PROPOFOL N/A 10/19/2021   Procedure: COLONOSCOPY WITH PROPOFOL;  Surgeon: Regis Bill, MD;  Location: [facility] ENDOSCOPY;  Service: Endoscopy;  Laterality: N/A;  DM   ESOPHAGOGASTRODUODENOSCOPY (EGD) WITH PROPOFOL N/A 10/19/2021   Procedure: ESOPHAGOGASTRODUODENOSCOPY (EGD) WITH PROPOFOL;  Surgeon: Regis Bill, MD;  Location: [facility] ENDOSCOPY;  Service: Endoscopy;  Laterality: N/A;   JOINT REPLACEMENT  Bilateral    PARTIAL KNEE REPLACEMENT   KNEE SURGERY     LAPAROSCOPIC NEPHRECTOMY, HAND ASSISTED Right 11/20/2021   Procedure: HAND ASSISTED LAPAROSCOPIC RADICAL NEPHRECTOMY;  Surgeon: Sondra Come, MD;  Location: [facility] ORS;  Service: Urology;  Laterality: Right;   PARATHYROIDECTOMY      SOCIAL HISTORY: Social History   Socioeconomic History   Marital status: Married    Spouse name: Not on file   Number of children: Not on file   Years of education: Not on file   Highest education level: Not on file  Occupational History   Not on file  Tobacco Use   Smoking status: Never    Passive exposure: Never   Smokeless tobacco: Never  Vaping Use   Vaping status: Never Used  Substance and Sexual Activity   Alcohol use: Not Currently    Comment: occ   Drug use: No   Sexual activity: Not Currently  Other Topics Concern   Not on file  Social History Narrative   Lives in [place] - close hospital; with husband; 3 biological children [[place];  [place]; [place] Never smoked; glass of wine rare. 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    Social Determinants of Health   Financial Resource Strain: Low Risk  (04/28/2023)   Received from [facility]   Overall Financial Resource Strain (CARDIA)    Difficulty of Paying Living Expenses: Not hard at all  Food Insecurity: No Food Insecurity (04/28/2023)   Received from [facility]   Hunger Vital Sign    Worried About Running Out of Food in the Last Year: Never true    Ran Out of Food in the Last Year: Never true  Transportation Needs: No Transportation Needs (04/28/2023)   Received from [facility] - Transportation    In the past 12 months, has lack of transportation kept you from medical appointments or from getting medications?: No    Lack of Transportation (Non-Medical): No  Physical Activity: Insufficiently Active (12/07/2021)   Exercise Vital Sign    Days of Exercise per  Week: 2 days    Minutes of Exercise per Session: 30 min  Stress: No Stress Concern Present (12/07/2021)   [employer] of Occupational Health - Occupational Stress Questionnaire    Feeling of Stress : Only a little  Social Connections: Moderately Integrated (12/07/2021)   Social Connection and Isolation Panel [NHANES]    Frequency of Communication with Friends and Family: Three times a week    Frequency of Social Gatherings with Friends and Family: Three times a week    Attends Religious Services: 1 to 4 times per year    Active Member of Clubs or Organizations: No    Attends Banker Meetings: Never    Marital Status: Married  Catering manager Violence: Not At Risk (12/07/2021)   Humiliation, Afraid, Rape, and Kick questionnaire    Fear of Current or Ex-Partner: No    Emotionally Abused: No    Physically Abused: No    Sexually Abused: No    FAMILY HISTORY: Family History  Problem Relation Age of Onset   Heart failure Mother    Stroke Father    Lung cancer Father    Breast cancer Neg Hx     ALLERGIES:  is allergic to codeine, nsaids, and oxycodone.  MEDICATIONS:  Current Outpatient Medications  Medication Sig Dispense Refill   Accu-Chek Softclix Lancets lancets USE 1 TO CHECK GLUCOSE ONCE DAILY     allopurinol (ZYLOPRIM) 100 MG tablet Take 100 mg by mouth every morning. Pt has decreased to 100mg      amLODipine (NORVASC) 5 MG tablet Take 5 mg by mouth every morning.     citalopram (CELEXA) 10 MG tablet Take 10 mg by mouth every morning.     ferrous sulfate 325 (65 FE) MG tablet Take 325 mg by mouth every other day.     gabapentin (NEURONTIN) 100 MG capsule Take 1 capsule by mouth twice daily 60 capsule 0   hydrOXYzine (ATARAX) 10 MG tablet TAKE 1 TABLET BY MOUTH AT BEDTIME AS NEEDED FOR ITCHING 30 tablet 0   irbesartan (AVAPRO) 150 MG tablet Take 150 mg by mouth every morning.     mupirocin ointment (BACTROBAN) 2 % APPLY OINTMENT TOPICALLY ONCE TO  TWICE DAILY  22 g 0   predniSONE (DELTASONE) 10 MG tablet Take 2 tablets for 2 week; and then take 1 tablet a day.  Do not stop until further directed.  Take it in the morning/with breakfast. 60 tablet 0   triamcinolone cream (KENALOG) 0.1 %  Apply to affected skin qd-bid prn itch, Avoid applying to face, groin, and axilla. Use as directed. Long-term use can cause thinning of the skin. 454 g 0   triamcinolone ointment (KENALOG) 0.5 % Apply 1 Application topically 2 (two) times daily. 80 g 1   vitamin B-12 (CYANOCOBALAMIN) 1000 MCG tablet Take 1,000 mcg by mouth daily.     No current facility-administered medications for this visit.    Maculo-papular rash noted on upper torso; and lower extremities.   PHYSICAL EXAMINATION:  Vitals:   08/16/23 1051  BP: (!) 122/59  Pulse: 67  Resp: 18  Temp: (!) 97.5 F (36.4 C)  SpO2: 97%      Filed Weights   08/16/23 1051  Weight: 212 lb 3.2 oz (96.3 kg)      Mild maculopapular rash noted in the back and also front.   Physical Exam Vitals and nursing note reviewed.  HENT:     Head: Normocephalic and atraumatic.     Mouth/Throat:     Pharynx: Oropharynx is clear.  Eyes:     Extraocular Movements: Extraocular movements intact.     Pupils: Pupils are equal, round, and reactive to light.  Cardiovascular:     Rate and Rhythm: Normal rate and regular rhythm.  Pulmonary:     Comments: Decreased breath sounds bilaterally.  Abdominal:     Palpations: Abdomen is soft.  Musculoskeletal:        General: Normal range of motion.     Cervical back: Normal range of motion.  Skin:    General: Skin is warm.  Neurological:     General: No focal deficit present.     Mental Status: She is alert and oriented to person, place, and time.  Psychiatric:        Behavior: Behavior normal.        Judgment: Judgment normal.      LABORATORY DATA:  I have reviewed the data as listed Lab Results  Component Value Date   WBC 7.1 08/16/2023   HGB 12.3 08/16/2023    HCT 40.5 08/16/2023   MCV 97.6 08/16/2023   PLT 215 08/16/2023   Recent Labs    02/14/23 1423 02/28/23 1035 04/18/23 1414 05/13/23 1252 08/16/23 1032  NA 137   < > 139 141 144  K 4.8   < > 4.6 4.7 4.5  CL 109   < > 114* 114* 111  CO2 22   < > 17* 21* 22  GLUCOSE 129*   < > 140* 104* 140*  BUN 50*   < > 61* 49* 43*  CREATININE 2.15*   < > 2.11* 1.98* 2.73*  CALCIUM 9.0   < > 8.8* 8.9 9.1  GFRNONAA 23*   < > 24* 26* 18*  PROT 7.2  --  6.5  --  6.8  ALBUMIN 3.9  --  3.9  --  3.9  AST 13*  --  11*  --  16  ALT 11  --  16  --  15  ALKPHOS 137*  --  102  --  136*  BILITOT 0.4  --  0.5  --  0.5   < > = values in this interval not displayed.    RADIOGRAPHIC STUDIES: I have personally reviewed the radiological images as listed and agreed with the findings in the report. CT ABDOMEN PELVIS WO CONTRAST  Result Date: 08/13/2023 CLINICAL DATA:  Follow-up renal cell carcinoma. Previous right nephrectomy. * Tracking Code: BO * EXAM: CT ABDOMEN  AND PELVIS WITHOUT CONTRAST TECHNIQUE: Multidetector CT imaging of the abdomen and pelvis was performed following the standard protocol without IV contrast. RADIATION DOSE REDUCTION: This exam was performed according to the departmental dose-optimization program which includes automated exposure control, adjustment of the mA and/or kV according to patient size and/or use of iterative reconstruction technique. COMPARISON:  02/10/2023 FINDINGS: Lower chest: No acute findings. Hepatobiliary: No mass visualized on this unenhanced exam. Small cyst again seen in the anterior right hepatic lobe. Prior cholecystectomy. No evidence of biliary obstruction. Pancreas: No mass or inflammatory process visualized on this unenhanced exam. Spleen:  Within normal limits in size. Adrenals/Urinary tract: Stable postop changes from right nephrectomy. No evidence of recurrent mass. No left renal mass identified on this unenhanced exam. No evidence of urolithiasis or  hydronephrosis. Stomach/Bowel: No evidence of obstruction, inflammatory process, or abnormal fluid collections. Normal appendix visualized. Mild colonic diverticulosis again seen, without signs of diverticulitis. Vascular/Lymphatic: No pathologically enlarged lymph nodes identified. No evidence of abdominal aortic aneurysm. Reproductive: Prior hysterectomy noted. Adnexal regions are unremarkable in appearance. Other:  None. Musculoskeletal: No suspicious bone lesions identified. Degenerative lumbar spondylosis and severe left hip osteoarthritis noted IMPRESSION: Stable exam.  No evidence of recurrent or metastatic disease. Mild colonic diverticulosis, without radiographic evidence of diverticulitis. Electronically Signed   By: Danae Orleans M.D.   On: 08/13/2023 13:43    Clear cell carcinoma of kidney, right (HCC) #Stage IIIa-clear-cell renal carcinoma-s/p  adjuvant [name] x 1 year [MAY 2024].NOV 30th 2024-non-contrast CT AP-  - Status post interval right nephrectomy. No suspicious soft tissue in the nephrectomy bed or other noncontrast CT evidence of recurrent or metastatic disease in the chest, abdomen, or pelvis.  Will plan surveillance imaging every 6 months also.  # June 2024- CT Unchanged 0.4 cm nodule of the dependent right lower lobe, nonspecific and almost certainly benign incidental sequelae of prior infection or inflammation. CT non- contrast AP- negative.   # Mild thrombocytopenia > 100- monitor for now.   # Skin rash/pruritus -likely secondary to [name]-- continue allegra; cerave; vaseline; atarax prn. Continue kenalog. Add singulair. Worse/ not better-  prednisone 20 mg/day x2 weeks; and the 10 mg da- not to stop; continue neurontin- 100 mg BID. Will discuss with Dr.Kowalski/derm- consider use of omalizumab/dupilumab.   # Intermittently- elevated calcium- 10.4; STOP vit D 50k; today calcium is 10.0.  Vit D levels- 69 [SEP 2023]- stable.    # Anemia: Hemoglobin around 10.8 overall  stable/improving; on PO iron every other day. NOV 2023- Iron sat-35%; ferretin- 330-stable.     #CKD stage IV- GFR ~19; Continue enough hydration. Monitor closely. [s/p Dr.Korrapati Nephrology evaluation next week. 2023]-stable.   #Diabetes: patient OFF metformin;/glimepiride [as per nephrology]- recommend checking BG on steroids at least twice a day; if elevated recommend follow up PCP/nephrology re: anti-diabetic medications.   # IV ACCESS: PIV.    # DISPOSITION: # follow up in MD 3 months-- labs-cbc/cmp; iron studies; ferritin; LDH; vit D25 [place]  Dr.B  All questions were answered. The patient knows to call the clinic with any problems, questions or concerns.   [name], MD 08/16/2023 12:59 PM

## 2023-08-16 NOTE — Assessment & Plan Note (Addendum)
#  Stage IIIa-clear-cell renal carcinoma-s/p  adjuvant [name] x 1 year [MAY 2024].NOV 30th 2024-non-contrast CT AP-  - Status post interval right nephrectomy. No suspicious soft tissue in the nephrectomy bed or other noncontrast CT evidence of recurrent or metastatic disease in the chest, abdomen, or pelvis.  Will plan surveillance imaging every 6 months also.  # June 2024- CT Unchanged 0.4 cm nodule of the dependent right lower lobe, nonspecific and almost certainly benign incidental sequelae of prior infection or inflammation. CT non- contrast AP- negative.   # Mild thrombocytopenia > 100- monitor for now.   # Skin rash/pruritus -likely secondary to [name]-- continue allegra; cerave; vaseline; atarax prn. Continue kenalog. Add singulair. Worse/ not better-  prednisone 20 mg/day x2 weeks; and the 10 mg da- not to stop; continue neurontin- 100 mg BID. Will discuss with Dr.Kowalski/derm- consider use of omalizumab/dupilumab.   # Intermittently- elevated calcium- 10.4; STOP vit D 50k; today calcium is 10.0.  Vit D levels- 69 [SEP 2023]- stable.    # Anemia: Hemoglobin around 10.8 overall stable/improving; on PO iron every other day. NOV 2023- Iron sat-35%; ferretin- 330-stable.     #CKD stage IV- GFR ~19; Continue enough hydration. Monitor closely. [s/p Dr.Korrapati Nephrology evaluation next week. 2023]-stable.   #Diabetes: patient OFF metformin;/glimepiride [as per nephrology]- recommend checking BG on steroids at least twice a day; if elevated recommend follow up PCP/nephrology re: anti-diabetic medications.   # IV ACCESS: PIV.    # DISPOSITION: # follow up in MD 3 months-- labs-cbc/cmp; iron studies; ferritin; LDH; vit D25 [place]  Dr.B

## 2023-08-19 ENCOUNTER — Other Ambulatory Visit: Payer: Self-pay | Admitting: Internal Medicine

## 2023-08-26 ENCOUNTER — Other Ambulatory Visit: Payer: Self-pay | Admitting: Internal Medicine

## 2023-08-30 ENCOUNTER — Ambulatory Visit: Payer: Medicare Other | Admitting: Dermatology

## 2023-08-30 DIAGNOSIS — Z7189 Other specified counseling: Secondary | ICD-10-CM

## 2023-08-30 DIAGNOSIS — Z79899 Other long term (current) drug therapy: Secondary | ICD-10-CM

## 2023-08-30 DIAGNOSIS — T07XXXA Unspecified multiple injuries, initial encounter: Secondary | ICD-10-CM

## 2023-08-30 DIAGNOSIS — L299 Pruritus, unspecified: Secondary | ICD-10-CM

## 2023-08-30 DIAGNOSIS — L281 Prurigo nodularis: Secondary | ICD-10-CM | POA: Diagnosis not present

## 2023-08-30 DIAGNOSIS — R21 Rash and other nonspecific skin eruption: Secondary | ICD-10-CM | POA: Diagnosis not present

## 2023-08-30 DIAGNOSIS — L209 Atopic dermatitis, unspecified: Secondary | ICD-10-CM | POA: Diagnosis not present

## 2023-08-30 DIAGNOSIS — S30810A Abrasion of lower back and pelvis, initial encounter: Secondary | ICD-10-CM

## 2023-08-30 DIAGNOSIS — S40812A Abrasion of left upper arm, initial encounter: Secondary | ICD-10-CM

## 2023-08-30 DIAGNOSIS — S40811A Abrasion of right upper arm, initial encounter: Secondary | ICD-10-CM

## 2023-08-30 MED ORDER — DUPILUMAB 300 MG/2ML ~~LOC~~ SOSY
600.0000 mg | PREFILLED_SYRINGE | Freq: Once | SUBCUTANEOUS | Status: AC
  Administered 2023-08-30: 600 mg via SUBCUTANEOUS

## 2023-08-30 NOTE — Patient Instructions (Addendum)
Dupilumab (Dupixent) is a treatment given by injection for adults and children with moderate-to-severe atopic dermatitis. Goal is control of skin condition, not cure. It is given as 2 injections at the first dose followed by 1 injection ever 2 weeks thereafter.  Young children are dosed monthly.  Potential side effects include allergic reaction, herpes infections, injection site reactions and conjunctivitis (inflammation of the eyes).  The use of Dupixent requires long term medication management, including periodic office visits.  Due to recent changes in healthcare laws, you may see results of your pathology and/or laboratory studies on MyChart before the doctors have had a chance to review them. We understand that in some cases there may be results that are confusing or concerning to you. Please understand that not all results are received at the same time and often the doctors may need to interpret multiple results in order to provide you with the best plan of care or course of treatment. Therefore, we ask that you please give [place] 2 business days to thoroughly review all your results before contacting the office for clarification. Should we see a critical lab result, you will be contacted sooner.   If You Need Anything After Your Visit  If you have any questions or concerns for your doctor, please call our main line at [phone] and press option 4 to reach your doctor's medical assistant. If no one answers, please leave a voicemail as directed and we will return your call as soon as possible. Messages left after 4 pm will be answered the following business day.   You may also send [place] a message via MyChart. We typically respond to MyChart messages within 1-2 business days.  For prescription refills, please ask your pharmacy to contact our office. Our fax number is [phone].  If you have an urgent issue when the clinic is closed that cannot wait until the next business day, you can page your  doctor at the number below.    Please note that while we do our best to be available for urgent issues outside of office hours, we are not available 24/7.   If you have an urgent issue and are unable to reach [place], you may choose to seek medical care at your doctor's office, retail clinic, urgent care center, or emergency room.  If you have a medical emergency, please immediately call 911 or go to the emergency department.  Pager Numbers  - Dr. Gwen Pounds: [phone]  - Dr. Roseanne Reno: [phone]  - Dr. Katrinka Blazing: [phone]   In the event of inclement weather, please call our main line at [phone] for an update on the status of any delays or closures.  Dermatology Medication Tips: Please keep the boxes that topical medications come in in order to help keep track of the instructions about where and how to use these. Pharmacies typically print the medication instructions only on the boxes and not directly on the medication tubes.   If your medication is too expensive, please contact our office at [phone] option 4 or send [place] a message through MyChart.   We are unable to tell what your co-pay for medications will be in advance as this is different depending on your insurance coverage. However, we may be able to find a substitute medication at lower cost or fill out paperwork to get insurance to cover a needed medication.   If a prior authorization is required to get your medication covered by your insurance company, please allow [place] 1-2 business days to complete  this process.  Drug prices often vary depending on where the prescription is filled and some pharmacies may offer cheaper prices.  The website www.goodrx.com contains coupons for medications through different pharmacies. The prices here do not account for what the cost may be with help from insurance (it may be cheaper with your insurance), but the website can give you the price if you did not use any insurance.  - You can print  the associated coupon and take it with your prescription to the pharmacy.  - You may also stop by our office during regular business hours and pick up a GoodRx coupon card.  - If you need your prescription sent electronically to a different pharmacy, notify our office through [facility] or by phone at [phone] option 4.     Si Usted Necesita Algo Despus de Su Visita  Tambin puede enviarnos un mensaje a travs de Clinical cytogeneticist. Por lo general respondemos a los mensajes de MyChart en el transcurso de 1 a 2 das hbiles.  Para renovar recetas, por favor pida a su farmacia que se ponga en contacto con nuestra oficina. Annie Sable de fax es [place] [phone].  Si tiene un asunto urgente cuando la clnica est cerrada y que no puede esperar hasta el siguiente da hbil, puede llamar/localizar a su doctor(a) al nmero que aparece a continuacin.   Por favor, tenga en cuenta que aunque hacemos todo lo posible para estar disponibles para asuntos urgentes fuera del horario de [place], no estamos disponibles las 24 horas del da, los 7 [address]  [address] de la [place].   Si tiene un problema urgente y no puede comunicarse con nosotros, puede optar por buscar atencin mdica  en el consultorio de su doctor(a), en una clnica privada, en un centro de atencin urgente o en una sala de emergencias.  Si tiene Engineer, drilling, por favor llame inmediatamente al 911 o vaya a la sala de emergencias.  Nmeros de bper  - Dr. Gwen Pounds: [phone]  - Dra. Roseanne Reno: [phone]  - Dr. Katrinka Blazing: [phone]   En caso de inclemencias del tiempo, por favor llame a Lacy Duverney principal al [phone] para una actualizacin sobre el [place] de cualquier retraso o cierre.  Consejos para la medicacin en dermatologa: Por favor, guarde las cajas en las que vienen los medicamentos de uso tpico para ayudarle a seguir las instrucciones sobre dnde y cmo usarlos. Las farmacias generalmente imprimen las  instrucciones del medicamento slo en las cajas y no directamente en los tubos del [place].   Si su medicamento es muy caro, por favor, pngase en contacto con Rolm Gala llamando al [phone] y presione la opcin 4 o envenos un mensaje a travs de Clinical cytogeneticist.   No podemos decirle cul ser su copago por los medicamentos por adelantado ya que esto es diferente dependiendo de la cobertura de su seguro. Sin embargo, es posible que podamos encontrar un medicamento sustituto a Audiological scientist un formulario para que el seguro cubra el medicamento que se considera necesario.   Si se requiere una autorizacin previa para que su compaa de seguros [place] su medicamento, por favor permtanos de 1 a 2 das hbiles para completar [address]  Los precios de los medicamentos varan con frecuencia dependiendo del Environmental consultant de dnde se surte la receta y alguna farmacias pueden ofrecer precios ms baratos.  El sitio web www.goodrx.com tiene cupones para medicamentos de Health and safety inspector. Los precios aqu no tienen en cuenta lo que podra costar con la ayuda del  seguro (puede ser ms barato con su seguro), pero el sitio web puede darle el precio si no Visual merchandiser.  - Puede imprimir el cupn correspondiente y llevarlo con su receta a la farmacia.  - Tambin puede pasar por nuestra oficina durante el horario de atencin regular y Education officer, museum una tarjeta de cupones de GoodRx.  - Si necesita que su receta se enve electrnicamente a una farmacia diferente, informe a nuestra oficina a travs de MyChart de [place] o por telfono llamando al [phone] y presione la opcin 4.

## 2023-08-30 NOTE — Progress Notes (Signed)
Follow-Up Visit   Subjective  Gabrielle Savage is a 77 y.o. female who presents for the following:  patient follow up on itchy rash at trunk, back, arms, sides and buttocks.  Has been using plaquenil, tmc cream , and claritin or allegra if needed. Patient reports still very itchy at back, sides and buttocks.    The patient has spots, moles and lesions to be evaluated, some may be new or changing and the patient may have concern these could be cancer.   The following portions of the chart were reviewed this encounter and updated as appropriate: medications, allergies, medical history  Review of Systems:  No other skin or systemic complaints except as noted in HPI or Assessment and Plan.  Objective  Well appearing patient in no apparent distress; mood and affect are within normal limits.   A focused examination was performed of the following areas: Back, chest, shoulders, arms  Relevant exam findings are noted in the Assessment and Plan.             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 For Kidney CA) vs lupus vs dermatomyositis vs other    Pt seen by Dr Allena Katz = Rheumatology and put on Plaquenil.  Pt still with significant rash and symptoms and even systemic steroids not helping but temporarily. We would like to avoid repeat systemic steroids if possible. Dr B, pts oncologist sends pt today for re-evaluation and for considerations of other treatment options.  Exam: Excoriations with pink scattered patches at arms, back, lower back, buttocks. See Photos   80% BSA  Chronic and persistent condition with duration or expected duration over one year. Condition is bothersome/symptomatic for patient. Currently flared.   Atopic dermatitis  (eczema) is a chronic, relapsing, pruritic condition that can significantly affect quality of life. It is often associated with allergic rhinitis and/or asthma and can require treatment with topical medications, phototherapy, or in severe cases biologic injectable medication (Dupixent; Adbry) or Oral JAK inhibitors.  Treatment Plan:  Patient has tried and failed tmc cream.  Continue Plaquenil as prescribed by Dr Allena Katz. Cont Allegra or Claritin daily  Start Dupixent 300 mg / 2 ml , Initial loading dose done today. Patient was injected with (1) 300 mg / 2 ml syringe of dupixent in right and left upper arm injected with (1) 300 mg/2 ml syringe of Dupixent. Patient tolerated well with no adverse reactions. [facility]  [facility] 4098-1191-47 [id number]  Exp 07/2025  Will send rx to [facility] for coverage.  Obtained signature for Dupixent Myway start form.   Will continue treatment and follow up in 2 weeks. If patient is improving will discuss d/c other medication patient is using for itch relief  Recommend gentle skin care.  Greater than 45 minutes spent in evaluation and treatment of pt today. ATOPIC DERMATITIS, UNSPECIFIED TYPE   Related Medications dupilumab (DUPIXENT) prefilled syringe 600 mg  RASH   COUNSELING AND COORDINATION OF CARE   MEDICATION MANAGEMENT   PRURITUS   MULTIPLE EXCORIATIONS    Return for 2 week rash follow up and dupixent shot can be added to Dr. Roseanne Reno or Dr. Katrinka Blazing schedule .  Geralynn Rile, CMA, am acting as scribe for Armida Sans, MD.   Documentation: I have reviewed the above documentation  for accuracy and completeness, and I agree with the above.  Armida Sans, MD

## 2023-09-05 ENCOUNTER — Telehealth: Payer: Self-pay

## 2023-09-05 ENCOUNTER — Encounter: Payer: Self-pay | Admitting: Dermatology

## 2023-09-05 NOTE — Telephone Encounter (Signed)
Request denial sent to pharmacy.

## 2023-09-05 NOTE — Telephone Encounter (Signed)
-----   Message from Armida Sans sent at 09/02/2023  3:48 PM EST ----- Regarding: RE: [facility] refill Prefer to hold off TMC to see how Dupixent does. ----- Message ----- From: Mickle Mallory, CMA Sent: 09/01/2023   2:26 PM EST To: Deirdre Evener, MD Subject: [facility] refill                                     Pt requesting TMC refill [address]lbs [address]. I wasn't sure if you wanted refilled based on last note. Thanks.

## 2023-09-12 ENCOUNTER — Encounter: Payer: Self-pay | Admitting: Internal Medicine

## 2023-09-15 ENCOUNTER — Ambulatory Visit: Payer: Medicare Other | Admitting: Dermatology

## 2023-09-15 ENCOUNTER — Encounter: Payer: Self-pay | Admitting: Dermatology

## 2023-09-15 DIAGNOSIS — L209 Atopic dermatitis, unspecified: Secondary | ICD-10-CM

## 2023-09-15 DIAGNOSIS — L281 Prurigo nodularis: Secondary | ICD-10-CM

## 2023-09-15 DIAGNOSIS — L299 Pruritus, unspecified: Secondary | ICD-10-CM

## 2023-09-15 DIAGNOSIS — T148XXA Other injury of unspecified body region, initial encounter: Secondary | ICD-10-CM

## 2023-09-15 DIAGNOSIS — L308 Other specified dermatitis: Secondary | ICD-10-CM

## 2023-09-15 MED ORDER — DUPILUMAB 300 MG/2ML ~~LOC~~ SOAJ
300.0000 mg | Freq: Once | SUBCUTANEOUS | Status: AC
  Administered 2023-09-15: 300 mg via SUBCUTANEOUS

## 2023-09-15 MED ORDER — TRIAMCINOLONE ACETONIDE 0.1 % EX CREA: TOPICAL_CREAM | CUTANEOUS | 0 refills | Status: DC

## 2023-09-15 NOTE — Patient Instructions (Addendum)

## 2023-09-15 NOTE — Progress Notes (Signed)
 Follow-Up Visit   Subjective  Gabrielle Savage is a 78 y.o. 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 For Kidney CA) vs lupus vs dermatomyositis vs other  - patient hasn't noticed an improvement in condition since loading dose of Dupixent . Topically, she is using Curel moisturizer. Patient currently on Prednisone  and she has been off and on that since the summer for the rash and itching. Prednisone  does help keep the rash and itching under control, but then it flares back up once she completes prescription. Patient has tried and failed Triamcinolone  0.1% cream, OTC Claritin, and OTC Allegra.    The following portions of the chart were reviewed this encounter and updated as appropriate: medications, allergies, medical history  Review of Systems:  No other skin or systemic complaints except as noted in HPI or Assessment and Plan.  Objective  Well appearing patient in no apparent distress; mood and affect are within normal limits.  Areas Examined: The face, arms, and back  Relevant physical exam findings are noted in the Assessment and Plan.    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 For Kidney CA) vs lupus vs dermatomyositis vs other - past medical history of renal cell cancer s/p laparoscopic nephrectomy on the R kidney 10/2022, pt was on [name]  and developed severe itching. Pruritus possibly related to loss of kidney function. Pt currently being followed by nephrologist Dr. Dominica and oncologist Dr.  Rennie  Exam: Scaly pink papules coalescing to plaques 80% BSA  Chronic and persistent condition with duration or expected duration over one year. Condition is symptomatic/ bothersome to patient. Not currently at goal.  Atopic dermatitis (eczema) is a chronic, relapsing, pruritic condition that can significantly affect quality of life. It is often associated with allergic rhinitis and/or asthma and can require treatment with topical medications, phototherapy, or in severe cases biologic injectable medication (Dupixent ; Adbry) or Oral JAK inhibitors.  Treatment Plan: Discussed that more time is needed to see if dupixent  helps. Pruritus may be nephrogenic given low GFR and initiation of symptoms 1 month after removal of a kidney and with the remaining kidney known to have reduced function. If pruritus is from renal dysfunction, then the only reliable treatment is to replace kidney function  Continue Dupixent  300mg /41mL SQ QOW, recommend a trial treatment of 3-6 mths, patient to come in for nurse visit, and will follow up with our office as scheduled in March. Dupixent  300mg /74mL injected SQ into the R upper arm post. Patient tolerated injection well. AL, CMA   NDC [id number] Onu#[id number], expiration date:02/09/2025  Continue TMC 0.1% cream to aa's BID.   Recommend Sarna with menthol OTC moisturizing lotion to help decrease itching sensation.   Recommend gentle skin care.  Discussed correlation between kidney function and pruritus.   Continue care with nephrologist Dr. Korrapati. Consider dialysis if atopic dermatitis with pruritus is severe enough to cause a significant negative effect on quality of life and is not improved with Dupixent  300mg /60mL.   Continue managing diabetes and blood pressure.   Return for nurse visit/Dupixent  injection.  LILLETTE Gabrielle Savage,  CMA, am acting as scribe for Boneta Sharps, MD .   Documentation: I have reviewed the above documentation for accuracy and  completeness, and I agree with the above.  Boneta Sharps, MD

## 2023-09-29 ENCOUNTER — Ambulatory Visit: Payer: Medicare Other

## 2023-09-29 DIAGNOSIS — L209 Atopic dermatitis, unspecified: Secondary | ICD-10-CM

## 2023-09-29 MED ORDER — DUPILUMAB 300 MG/2ML ~~LOC~~ SOSY
300.0000 mg | PREFILLED_SYRINGE | Freq: Once | SUBCUTANEOUS | Status: AC
  Administered 2023-09-29: 300 mg via SUBCUTANEOUS

## 2023-09-29 MED ORDER — DUPILUMAB 300 MG/2ML ~~LOC~~ SOSY
300.0000 mg | PREFILLED_SYRINGE | SUBCUTANEOUS | Status: AC
  Administered 2023-10-13: 300 mg via SUBCUTANEOUS

## 2023-09-29 NOTE — Addendum Note (Signed)
Addended by: Addison Bailey I on: 09/29/2023 02:43 PM   Modules accepted: Orders

## 2023-09-29 NOTE — Progress Notes (Addendum)
Patient here for two week Dupixent injection or Severe Atopic Dermatitis.    Dupixent 300mg  Pen injected into left Upper Arm. Patient tolerated procedure well.    Lot: [id number] Exp:04/2025 EAV:[id number]   Butch Penny., RMA

## 2023-10-01 ENCOUNTER — Other Ambulatory Visit: Payer: Self-pay | Admitting: Internal Medicine

## 2023-10-07 ENCOUNTER — Other Ambulatory Visit: Payer: Self-pay | Admitting: Internal Medicine

## 2023-10-07 MED ORDER — HYDROXYZINE HCL 10 MG PO TABS: 10.0000 mg | ORAL_TABLET | Freq: Every evening | ORAL | 1 refills | Status: DC | PRN

## 2023-10-07 MED ORDER — GABAPENTIN 100 MG PO CAPS: 100.0000 mg | ORAL_CAPSULE | Freq: Two times a day (BID) | ORAL | 1 refills | Status: DC

## 2023-10-10 ENCOUNTER — Other Ambulatory Visit: Payer: Self-pay

## 2023-10-10 MED ORDER — TRIAMCINOLONE ACETONIDE 0.1 % EX CREA: TOPICAL_CREAM | CUTANEOUS | 0 refills | Status: DC

## 2023-10-13 ENCOUNTER — Ambulatory Visit: Payer: Medicare Other

## 2023-10-13 DIAGNOSIS — L209 Atopic dermatitis, unspecified: Secondary | ICD-10-CM

## 2023-10-13 NOTE — Progress Notes (Signed)
Patient here today for two week Dupixent injection for severe atopic dermatitis.   Dupixent 300mg  syringe injected into L upper arm. Patient tolerated injection well.   LOT: [id number] EXP: 12/2025  Dorathy Daft, RMA

## 2023-10-27 ENCOUNTER — Telehealth: Payer: Self-pay

## 2023-10-27 ENCOUNTER — Ambulatory Visit: Payer: Medicare Other

## 2023-10-27 DIAGNOSIS — L209 Atopic dermatitis, unspecified: Secondary | ICD-10-CM | POA: Diagnosis not present

## 2023-10-27 NOTE — Progress Notes (Signed)
Patient here today for two week Dupixent injection for severe atopic dermatitis.    Dupixent 300mg  syringe injected into right upper arm. Patient tolerated injection well.    LOT: [id number] EXP: 04/13/2025   Dorathy Daft, RMA

## 2023-10-27 NOTE — Telephone Encounter (Signed)
Additional orders needed for patients nurse visits for Dupixent injections.   Okay to enter?

## 2023-10-31 MED ORDER — DUPILUMAB 300 MG/2ML ~~LOC~~ SOSY
300.0000 mg | PREFILLED_SYRINGE | SUBCUTANEOUS | Status: AC
Start: 2023-10-31 — End: 2023-11-28
  Administered 2023-10-27 – 2023-11-10 (×2): 300 mg via SUBCUTANEOUS

## 2023-11-06 ENCOUNTER — Other Ambulatory Visit: Payer: Self-pay | Admitting: Internal Medicine

## 2023-11-07 ENCOUNTER — Encounter: Payer: Self-pay | Admitting: Internal Medicine

## 2023-11-10 ENCOUNTER — Ambulatory Visit: Payer: Medicare Other

## 2023-11-10 DIAGNOSIS — L209 Atopic dermatitis, unspecified: Secondary | ICD-10-CM

## 2023-11-10 NOTE — Progress Notes (Signed)
 Patient here today for two week Dupixent injection for severe atopic dermatitis.    Dupixent 300mg  syringe injected into left upper arm. Patient tolerated injection well.    LOT: [id number] EXP: 12/2025   Dorathy Daft, RMA

## 2023-11-15 ENCOUNTER — Inpatient Hospital Stay: Payer: Medicare Other | Attending: Internal Medicine

## 2023-11-15 ENCOUNTER — Inpatient Hospital Stay (HOSPITAL_BASED_OUTPATIENT_CLINIC_OR_DEPARTMENT_OTHER): Payer: Medicare Other | Admitting: Internal Medicine

## 2023-11-15 ENCOUNTER — Encounter: Payer: Self-pay | Admitting: Internal Medicine

## 2023-11-15 VITALS — BP 156/67 | HR 75 | Temp 97.5°F | Resp 16 | Ht 61.0 in | Wt 214.8 lb

## 2023-11-15 DIAGNOSIS — Z79899 Other long term (current) drug therapy: Secondary | ICD-10-CM | POA: Diagnosis not present

## 2023-11-15 DIAGNOSIS — Z905 Acquired absence of kidney: Secondary | ICD-10-CM | POA: Insufficient documentation

## 2023-11-15 DIAGNOSIS — D649 Anemia, unspecified: Secondary | ICD-10-CM | POA: Insufficient documentation

## 2023-11-15 DIAGNOSIS — N184 Chronic kidney disease, stage 4 (severe): Secondary | ICD-10-CM | POA: Insufficient documentation

## 2023-11-15 DIAGNOSIS — R911 Solitary pulmonary nodule: Secondary | ICD-10-CM | POA: Insufficient documentation

## 2023-11-15 DIAGNOSIS — E559 Vitamin D deficiency, unspecified: Secondary | ICD-10-CM | POA: Diagnosis not present

## 2023-11-15 DIAGNOSIS — C641 Malignant neoplasm of right kidney, except renal pelvis: Secondary | ICD-10-CM | POA: Insufficient documentation

## 2023-11-15 DIAGNOSIS — D696 Thrombocytopenia, unspecified: Secondary | ICD-10-CM | POA: Diagnosis not present

## 2023-11-15 DIAGNOSIS — Z801 Family history of malignant neoplasm of trachea, bronchus and lung: Secondary | ICD-10-CM | POA: Insufficient documentation

## 2023-11-15 DIAGNOSIS — E1122 Type 2 diabetes mellitus with diabetic chronic kidney disease: Secondary | ICD-10-CM | POA: Insufficient documentation

## 2023-11-15 DIAGNOSIS — I129 Hypertensive chronic kidney disease with stage 1 through stage 4 chronic kidney disease, or unspecified chronic kidney disease: Secondary | ICD-10-CM | POA: Diagnosis not present

## 2023-11-15 LAB — CMP (CANCER CENTER ONLY)
ALT: 21 U/L (ref 0–44)
AST: 24 U/L (ref 15–41)
Albumin: 4.2 g/dL (ref 3.5–5.0)
Alkaline Phosphatase: 129 U/L — ABNORMAL HIGH (ref 38–126)
Anion gap: 9 (ref 5–15)
BUN: 47 mg/dL — ABNORMAL HIGH (ref 8–23)
CO2: 23 mmol/L (ref 22–32)
Calcium: 8.9 mg/dL (ref 8.9–10.3)
Chloride: 109 mmol/L (ref 98–111)
Creatinine: 2.87 mg/dL — ABNORMAL HIGH (ref 0.44–1.00)
GFR, Estimated: 17 mL/min — ABNORMAL LOW
Glucose, Bld: 140 mg/dL — ABNORMAL HIGH (ref 70–99)
Potassium: 4.6 mmol/L (ref 3.5–5.1)
Sodium: 141 mmol/L (ref 135–145)
Total Bilirubin: 0.6 mg/dL (ref 0.0–1.2)
Total Protein: 6.9 g/dL (ref 6.5–8.1)

## 2023-11-15 LAB — CBC WITH DIFFERENTIAL (CANCER CENTER ONLY)
Abs Immature Granulocytes: 0.07 10*3/uL (ref 0.00–0.07)
Basophils Absolute: 0 10*3/uL (ref 0.0–0.1)
Basophils Relative: 0 %
Eosinophils Absolute: 1 10*3/uL — ABNORMAL HIGH (ref 0.0–0.5)
Eosinophils Relative: 11 %
HCT: 37.7 % (ref 36.0–46.0)
Hemoglobin: 11.2 g/dL — ABNORMAL LOW (ref 12.0–15.0)
Immature Granulocytes: 1 %
Lymphocytes Relative: 15 %
Lymphs Abs: 1.4 10*3/uL (ref 0.7–4.0)
MCH: 28.9 pg (ref 26.0–34.0)
MCHC: 29.7 g/dL — ABNORMAL LOW (ref 30.0–36.0)
MCV: 97.2 fL (ref 80.0–100.0)
Monocytes Absolute: 0.9 10*3/uL (ref 0.1–1.0)
Monocytes Relative: 10 %
Neutro Abs: 5.6 10*3/uL (ref 1.7–7.7)
Neutrophils Relative %: 63 %
Platelet Count: 249 10*3/uL (ref 150–400)
RBC: 3.88 MIL/uL (ref 3.87–5.11)
RDW: 15.9 % — ABNORMAL HIGH (ref 11.5–15.5)
WBC Count: 9 10*3/uL (ref 4.0–10.5)
nRBC: 0 % (ref 0.0–0.2)

## 2023-11-15 LAB — FERRITIN: Ferritin: 194 ng/mL (ref 11–307)

## 2023-11-15 LAB — IRON AND TIBC
Iron: 54 ug/dL (ref 28–170)
Saturation Ratios: 16 % (ref 10.4–31.8)
TIBC: 336 ug/dL (ref 250–450)
UIBC: 282 ug/dL

## 2023-11-15 LAB — LACTATE DEHYDROGENASE: LDH: 220 U/L — ABNORMAL HIGH (ref 98–192)

## 2023-11-15 LAB — VITAMIN D 25 HYDROXY (VIT D DEFICIENCY, FRACTURES): Vit D, 25-Hydroxy: 39.62 ng/mL (ref 30–100)

## 2023-11-15 NOTE — Progress Notes (Signed)
 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  Histologic Grade (WHO / ISUP Grade): G3, nucleoli conspicuous and  eosinophilic at 100 X magnification  Tumor Extent: Extends into major vein (segmental branch adjacent to  renal sinus)  Sarcomatoid Features: Not identified  Rhabdoid Features: Not identified  Tumor Necrosis: Present, microscopic and macroscopic, 20%   MARGINS  Margin Status: All margins negative for invasive carcinoma   REGIONAL LYMPH NODES  Regional Lymph Node Status: Not applicable (no regional lymph nodes  submitted or found)   DISTANT METASTASIS  Distant Site(s) Involved, if applicable: Not applicable   PATHOLOGIC STAGE CLASSIFICATION (pTNM, AJCC 8th Edition):  TNM Descriptors: Applicable  pT3a  pN - Not assigned (no lymph nodes submitted or found)  pM - Not applicable   # PT3A- stage III; G-3; no sarcomatoid features [right nephrectomy- Dr.Sninksi].  # FEB 14th, 2023- CT AP [GI; [facility]]- . Large mass exophytic extending from lower to mid pole of the RIGHT kidney consistent with renal neoplasm. [Dr.Sninski; Uorlogy]  # FEB 2023- Right lower lobe subpleural 4 mm lung nodule; bone survey negative  #April 7th, 2023- On adjuvant [name]    # CKD III [ sec to HTN > 20 years; none now]; Anemia- EGD/colo- 2023- [[facility]-GI]; Dr.Kowalski [No CAD]   Clear cell carcinoma of kidney, right (HCC)  12/12/2021 Initial Diagnosis   Clear cell renal cell carcinoma, right (HCC)   12/18/2021 - 04/05/2022 Chemotherapy   Patient is on Treatment Plan : RENAL CELL Pembrolizumab (200) q21d     12/18/2021 -  Chemotherapy   Patient is on Treatment  Plan : kidney adjuvant Pembrolizumab (200) q21d     01/08/2022 Cancer Staging   Staging form: Kidney, AJCC 8th Edition - Pathologic: Stage III (pT3a, pN0, cM0) - Signed by [name], MD on 01/08/2022     HISTORY OF PRESENTING ILLNESS: Ambulating independently.  Accompanied by husband.  Gabrielle Savage 78 y.o.  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  Patient recently fell 11/08/23, missed a step, no injury.no LOC. Fell on shoulder. No trauma.    Occasional SOB. O2 today 88%. C/o feet, ankle and calf swelling x 2-3 months. No worsening cough.  Positive for weight gain.   Denies any worsening joint pains.  No nausea no vomiting.  Echocardiogram  last Monday, [facility].   Appetite 25% normal, no supplements.  Review of Systems  Constitutional:  Positive for malaise/fatigue. Negative for chills and fever.  HENT:  Negative for nosebleeds and sore throat.   Eyes:  Negative for double vision.  Respiratory:  Negative for cough, hemoptysis, sputum production and wheezing.   Cardiovascular:  Negative for chest pain, palpitations, orthopnea and leg swelling.  Gastrointestinal:  Negative for abdominal pain, blood in stool, constipation, diarrhea, heartburn, melena, nausea and vomiting.  Genitourinary:  Negative for dysuria, frequency and urgency.  Musculoskeletal:  Positive for back pain and joint pain.  Skin:  Positive for itching and rash.  Neurological:  Negative for dizziness, tingling, focal weakness, weakness and headaches.  Endo/Heme/Allergies:  Does not bruise/bleed easily.  Psychiatric/Behavioral:  Negative for depression. The patient is not nervous/anxious and does not have insomnia.      MEDICAL HISTORY:  Past Medical History:  Diagnosis Date   Aortic atherosclerosis (HCC)    Arthritis    B12 deficiency    Bradycardia    CAD  (coronary artery disease)    Carotid atherosclerosis    CKD (chronic kidney disease), stage III (HCC)    Diastolic dysfunction 09/03/2021   a.)  TTE 09/03/2021: EF 63%; no RWMAs; LA mildly enlarged; trivial TR/PR, mild AR, moderate MR; G1DD.   DOE (dyspnea on exertion)    Fatigue    Gout    HLD (hyperlipidemia)    Hypertension    IDA (iron deficiency anemia)    Iliotibial band syndrome, left leg    MDD (major depressive disorder)    OAB (overactive bladder)    Obesity    PAC (premature atrial contraction)    noted on Holter   PONV (postoperative nausea and vomiting)    a.) single episode in 03/2013   PVC (premature ventricular contraction)    noted on Holter   Right kidney mass 10/27/2021   a.) CT 10/27/2021 -- mass measuring 8.3 x 6.8 x 8.1 cm   Sensorineural hearing loss (SNHL) of both ears    SUI (stress urinary incontinence, female)    SVT (supraventricular tachycardia) (HCC)    noted on Holter   T2DM (type 2 diabetes mellitus) (HCC)    Uterovaginal prolapse    Vitamin D deficiency    Voiding dysfunction     SURGICAL HISTORY: Past Surgical History:  Procedure Laterality Date   ABDOMINAL HYSTERECTOMY     partial   BLADDER SUSPENSION     CHOLECYSTECTOMY     COLONOSCOPY N/A 07/25/2017   Procedure: COLONOSCOPY;  Surgeon: Christena Deem, MD;  Location: [facility] ENDOSCOPY;  Service: Endoscopy;  Laterality: N/A;   COLONOSCOPY WITH PROPOFOL N/A 10/19/2021   Procedure: COLONOSCOPY WITH PROPOFOL;  Surgeon: Regis Bill, MD;  Location: [facility] ENDOSCOPY;  Service: Endoscopy;  Laterality: N/A;  DM   ESOPHAGOGASTRODUODENOSCOPY (EGD) WITH PROPOFOL N/A 10/19/2021   Procedure: ESOPHAGOGASTRODUODENOSCOPY (EGD) WITH PROPOFOL;  Surgeon: Regis Bill, MD;  Location: [facility] ENDOSCOPY;  Service: Endoscopy;  Laterality: N/A;   JOINT REPLACEMENT Bilateral    PARTIAL KNEE REPLACEMENT   KNEE SURGERY     LAPAROSCOPIC NEPHRECTOMY, HAND ASSISTED Right 11/20/2021   Procedure: HAND  ASSISTED LAPAROSCOPIC RADICAL NEPHRECTOMY;  Surgeon: Sondra Come, MD;  Location: [facility] ORS;  Service: Urology;  Laterality: Right;   PARATHYROIDECTOMY      SOCIAL HISTORY: Social History   Socioeconomic History   Marital status: Married    Spouse name: Not on file   Number of children: Not on file   Years of education: Not on file   Highest education level: Not on file  Occupational History   Not on file  Tobacco Use   Smoking status: Never    Passive exposure: Never   Smokeless tobacco: Never  Vaping Use   Vaping status: Never Used  Substance and Sexual Activity   Alcohol use: Not Currently    Comment: occ   Drug use: No   Sexual activity: Not Currently  Other Topics Concern   Not on file  Social History Narrative   Lives in [place] - close hospital; with husband; 3 biological children [[place]; [place]; [place]]. Never smoked; glass of wine rare. 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    Social Drivers of Corporate investment banker  Strain: Low Risk  (11/04/2023)   Received from [facility]   Overall Financial Resource Strain (CARDIA)    Difficulty of Paying Living Expenses: Not hard at all  Food Insecurity: No Food Insecurity (11/04/2023)   Received from [facility]   Hunger Vital Sign    Worried About Running Out of Food in the Last Year: Never true    Ran Out of Food in the Last Year: Never true  Transportation Needs: No Transportation Needs (11/04/2023)   Received from [facility] - Transportation    In the past 12 months, has lack of transportation kept you from medical appointments or from getting medications?: No    Lack of Transportation (Non-Medical): No  Physical Activity: Insufficiently Active (12/07/2021)   Exercise Vital Sign    Days of Exercise per Week: 2 days    Minutes of Exercise per Session: 30 min  Stress: No Stress Concern Present (12/07/2021)   [employer] of Occupational  Health - Occupational Stress Questionnaire    Feeling of Stress : Only a little  Social Connections: Moderately Integrated (12/07/2021)   Social Connection and Isolation Panel [NHANES]    Frequency of Communication with Friends and Family: Three times a week    Frequency of Social Gatherings with Friends and Family: Three times a week    Attends Religious Services: 1 to 4 times per year    Active Member of Clubs or Organizations: No    Attends Banker Meetings: Never    Marital Status: Married  Catering manager Violence: Not At Risk (12/07/2021)   Humiliation, Afraid, Rape, and Kick questionnaire    Fear of Current or Ex-Partner: No    Emotionally Abused: No    Physically Abused: No    Sexually Abused: No    FAMILY HISTORY: Family History  Problem Relation Age of Onset   Heart failure Mother    Stroke Father    Lung cancer Father    Breast cancer Neg Hx     ALLERGIES:  is allergic to codeine, nsaids, and oxycodone.  MEDICATIONS:  Current Outpatient Medications  Medication Sig Dispense Refill   Accu-Chek Softclix Lancets lancets USE 1 TO CHECK GLUCOSE ONCE DAILY     allopurinol (ZYLOPRIM) 100 MG tablet Take 100 mg by mouth every morning. Pt has decreased to 100mg      amLODipine (NORVASC) 5 MG tablet Take 5 mg by mouth every morning.     ferrous sulfate 325 (65 FE) MG tablet Take 325 mg by mouth every other day.     gabapentin (NEURONTIN) 100 MG capsule Take 1 capsule (100 mg total) by mouth 2 (two) times daily. 60 capsule 1   irbesartan (AVAPRO) 150 MG tablet Take 150 mg by mouth every morning.     mupirocin ointment (BACTROBAN) 2 % APPLY OINTMENT TOPICALLY ONCE TO  TWICE DAILY 22 g 0   sertraline (ZOLOFT) 100 MG tablet Take 100 mg by mouth daily.     triamcinolone cream (KENALOG) 0.1 % Apply to affected skin qd-bid prn itch, Avoid applying to face, groin, and axilla. Use as directed. Long-term use can cause thinning of the skin. 454 g 0   vitamin B-12  (CYANOCOBALAMIN) 1000 MCG tablet Take 1,000 mcg by mouth daily.     No current facility-administered medications for this visit.    Maculo-papular rash noted on upper torso; and lower extremities.   PHYSICAL EXAMINATION:  Vitals:   11/15/23 1019 11/15/23 1034  BP: (!) 167/70 (!) 156/67  Pulse: 75   Resp: 16   Temp: (!) 97.5 F (36.4 C)       Filed Weights   11/15/23 1019  Weight: 214 lb 12.8 oz (97.4 kg)      Mild maculopapular rash noted in the back and also front.   Physical Exam Vitals and nursing note reviewed.  HENT:     Head: Normocephalic and atraumatic.     Mouth/Throat:     Pharynx: Oropharynx is clear.  Eyes:     Extraocular Movements: Extraocular movements intact.     Pupils: Pupils are equal, round, and reactive to light.  Cardiovascular:     Rate and Rhythm: Normal rate and regular rhythm.  Pulmonary:     Comments: Decreased breath sounds bilaterally.  Abdominal:     Palpations: Abdomen is soft.  Musculoskeletal:        General: Normal range of motion.     Cervical back: Normal range of motion.  Skin:    General: Skin is warm.  Neurological:     General: No focal deficit present.     Mental Status: She is alert and oriented to person, place, and time.  Psychiatric:        Behavior: Behavior normal.        Judgment: Judgment normal.      LABORATORY DATA:  I have reviewed the data as listed Lab Results  Component Value Date   WBC 9.0 11/15/2023   HGB 11.2 (L) 11/15/2023   HCT 37.7 11/15/2023   MCV 97.2 11/15/2023   PLT 249 11/15/2023   Recent Labs    04/18/23 1414 05/13/23 1252 08/16/23 1032 11/15/23 1010  NA 139 141 144 141  K 4.6 4.7 4.5 4.6  CL 114* 114* 111 109  CO2 17* 21* 22 23  GLUCOSE 140* 104* 140* 140*  BUN 61* 49* 43* 47*  CREATININE 2.11* 1.98* 2.73* 2.87*  CALCIUM 8.8* 8.9 9.1 8.9  GFRNONAA 24* 26* 18* 17*  PROT 6.5  --  6.8 6.9  ALBUMIN 3.9  --  3.9 4.2  AST 11*  --  16 24  ALT 16  --  15 21  ALKPHOS 102   --  136* 129*  BILITOT 0.5  --  0.5 0.6    RADIOGRAPHIC STUDIES: I have personally reviewed the radiological images as listed and agreed with the findings in the report. No results found.  Clear cell carcinoma of kidney, right (HCC) #Stage IIIa-clear-cell renal carcinoma-s/p  adjuvant [name] x 1 year [MAY 2024].NOV 30th 2024-non-contrast CT AP-  - Status post interval right nephrectomy. No suspicious soft tissue in the nephrectomy bed or other noncontrast CT evidence of recurrent or metastatic disease in the chest, abdomen, or pelvis.  Will plan surveillance imaging every 6 months also.  # June 2024- CT Unchanged 0.4 cm nodule of the dependent right lower lobe, nonspecific and almost certainly benign incidental sequelae of prior infection or inflammation. CT non- contrast AP- negative. Will order CT non- contrast.   # Mild thrombocytopenia > 100- monitor for now.   # Skin rash/pruritus -likely secondary to [name]-- improved- on duxpient ]Dr.Kowalski]- stable.   # Intermittently- elevated calcium- 10.4; STOP vit D 50k; today calcium is 10.0.  Vit D levels- 69 [SEP 2023]- stable.    # Anemia: Hemoglobin around 11-12  overall stable/improving; on PO iron every other day. NOV 2023- Iron sat-35%; ferretin- 330-stable.     #CKD stage IV- GFR ~18; Continue enough  hydration. Monitor closely. [s/p Dr.Korrapati Nephrology evaluation next week. 2023]- unable to refill- Fraxiga- overall stable.  FEB 205- [facility]-cards: NORMAL LEFT VENTRICULAR SYSTOLIC FUNCTION WITH MODERATE LVH  ESTIMATED EF: >55%     #Diabetes: patient OFF metformin;/glimepiride [as per nephrology]-  stable.   # IV ACCESS: PIV.    # DISPOSITION: # follow up in MD 3 months-- labs-cbc/cmp; iron studies; ferritin; LDH; vit D25 OH; CT CAP- non-contrast-  Dr.B   All questions were answered. The patient knows to call the clinic with any problems, questions or concerns.   [name], MD 11/15/2023 11:20 AM

## 2023-11-15 NOTE — Progress Notes (Signed)
 Larey Seat 11/08/23, missed a step, no injury.  Echocardiogram  last Monday, [facility].  Appetite 25% normal, no supplements.  C/o feet, ankle and calf swelling x3 months.

## 2023-11-15 NOTE — Assessment & Plan Note (Addendum)
#  Stage IIIa-clear-cell renal carcinoma-s/p  adjuvant [name] x 1 year [MAY 2024].NOV 30th 2024-non-contrast CT AP-  - Status post interval right nephrectomy. No suspicious soft tissue in the nephrectomy bed or other noncontrast CT evidence of recurrent or metastatic disease in the chest, abdomen, or pelvis.  Will plan surveillance imaging every 6 months also.  # June 2024- CT Unchanged 0.4 cm nodule of the dependent right lower lobe, nonspecific and almost certainly benign incidental sequelae of prior infection or inflammation. CT non- contrast AP- negative. Will order CT non- contrast.   # Mild thrombocytopenia > 100- monitor for now.   # Skin rash/pruritus -likely secondary to [name]-- improved- on duxpient ]Dr.Kowalski]- stable.   # Intermittently- elevated calcium- 10.4; STOP vit D 50k; today calcium is 10.0.  Vit D levels- 69 [SEP 2023]- stable.    # Anemia: Hemoglobin around 11-12  overall stable/improving; on PO iron every other day. NOV 2023- Iron sat-35%; ferretin- 330-stable.     #CKD stage IV- GFR ~18; Continue enough hydration. Monitor closely. [s/p Dr.Korrapati Nephrology evaluation next week. 2023]- unable to refill- Fraxiga- overall stable.  FEB 205- [facility]-cards: NORMAL LEFT VENTRICULAR SYSTOLIC FUNCTION WITH MODERATE LVH  ESTIMATED EF: >55%     #Diabetes: patient OFF metformin;/glimepiride [as per nephrology]-  stable.   # IV ACCESS: PIV.    # DISPOSITION: # follow up in MD 3 months-- labs-cbc/cmp; iron studies; ferritin; LDH; vit D25 OH; CT CAP- non-contrast-  Dr.B

## 2023-11-16 ENCOUNTER — Other Ambulatory Visit: Payer: Self-pay

## 2023-11-17 ENCOUNTER — Encounter: Payer: Self-pay | Admitting: Dermatology

## 2023-11-17 ENCOUNTER — Ambulatory Visit: Payer: Medicare Other | Admitting: Dermatology

## 2023-11-17 DIAGNOSIS — L209 Atopic dermatitis, unspecified: Secondary | ICD-10-CM | POA: Diagnosis not present

## 2023-11-17 DIAGNOSIS — L309 Dermatitis, unspecified: Secondary | ICD-10-CM

## 2023-11-17 DIAGNOSIS — R21 Rash and other nonspecific skin eruption: Secondary | ICD-10-CM

## 2023-11-17 DIAGNOSIS — Z79899 Other long term (current) drug therapy: Secondary | ICD-10-CM

## 2023-11-17 DIAGNOSIS — L281 Prurigo nodularis: Secondary | ICD-10-CM | POA: Diagnosis not present

## 2023-11-17 DIAGNOSIS — Z7189 Other specified counseling: Secondary | ICD-10-CM

## 2023-11-17 DIAGNOSIS — L2089 Other atopic dermatitis: Secondary | ICD-10-CM

## 2023-11-17 DIAGNOSIS — L299 Pruritus, unspecified: Secondary | ICD-10-CM | POA: Diagnosis not present

## 2023-11-17 DIAGNOSIS — T148XXD Other injury of unspecified body region, subsequent encounter: Secondary | ICD-10-CM

## 2023-11-17 MED ORDER — DUPIXENT 300 MG/2ML ~~LOC~~ SOAJ: 300.0000 mg | SUBCUTANEOUS | 5 refills | Status: DC

## 2023-11-17 NOTE — Patient Instructions (Addendum)

## 2023-11-17 NOTE — Addendum Note (Signed)
 Addended by: Asher Muir A on: 11/17/2023 03:15 PM   Modules accepted: Orders

## 2023-11-17 NOTE — Progress Notes (Signed)
   Follow-Up Visit   Subjective  Gabrielle Savage is a 78 y.o. female who presents for the following: Atopic Dermatitis follow up, currently on Dupixent injections since December.  Reports has been improving on Dupixent.   The following portions of the chart were reviewed this encounter and updated as appropriate: medications, allergies, medical history  Review of Systems:  No other skin or systemic complaints except as noted in HPI or Assessment and Plan.  Objective  Well appearing patient in no apparent distress; mood and affect are within normal limits.  Areas Examined: Back, b/l shoulders   Relevant physical exam findings are noted in the Assessment and Plan.            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 And Rash = Biopsy proven : Interface dermatitis with negative DIF (=direct immunofluorescence) (see path)  with positive ANA, positive RNP,  secondary to drug reacton ([name]? For Kidney CA) vs lupus vs dermatomyositis vs other    Pt seen by Dr Allena Katz = Rheumatology and put on Plaquenil.   Pt still with significant rash and symptoms and even systemic steroids not helping but temporarily. We would like to avoid repeat systemic steroids if possible. Dr B, pts oncologist originally sent pt for evaluation and for considerations for treatment options.  Exam: Scaly pink papules coalescing to plaques see photos  15 % BSA  Chronic and persistent condition with duration or expected duration over one year. Condition is improving with Dupixent treatment but not currently at goal.  Atopic dermatitis - Severe, on Dupixent (biologic medication).  Atopic dermatitis (eczema) is a chronic, relapsing, pruritic condition that can significantly affect quality of life. It is often associated with allergic rhinitis and/or asthma and can require treatment with topical medications, phototherapy, or in severe  cases biologic medications, which require long term medication management.    Treatment Plan: Patient has tried and failed tmc cream.  Continue Plaquenil as prescribed by Dr Allena Katz - pt may discuss with Dr Allena Katz if she wants to try to D/C Plaquenil Cont Allegra or Claritin daily prn  Continue Dupixent 300 mg / 2 ml injections q2 weeks.   Continue TMC 0.1% cream to aa's BID.    Recommend Sarna with menthol OTC moisturizing lotion to help decrease itching sensation.   Discussed Nemluvio as another option in future. Would consider monthly injections to as to 2 weeks injections  4 - 6 month follow up   Recommend gentle skin care.  Potential side effects include allergic reaction, herpes infections, injection site reactions and conjunctivitis (inflammation of the eyes).  The use of Dupixent requires long term medication management, including periodic office visits.    Long term medication management.  Patient is using long term (months to years) prescription medication  to control their dermatologic condition.  These medications require periodic monitoring to evaluate for efficacy and side effects and may require periodic laboratory monitoring.   Recommend gentle skin care.  Return for need appt nurse visit 3/13 for dupixent injection, 4 - 6 month follow on atopic derm.  IAsher Muir, CMA, am acting as scribe for Armida Sans, MD.   Documentation: I have reviewed the above documentation for accuracy and completeness, and I agree with the above.  Armida Sans, MD

## 2023-11-18 ENCOUNTER — Other Ambulatory Visit: Payer: Self-pay

## 2023-11-23 ENCOUNTER — Other Ambulatory Visit: Payer: Self-pay

## 2023-11-23 DIAGNOSIS — L2089 Other atopic dermatitis: Secondary | ICD-10-CM

## 2023-11-23 MED ORDER — DUPILUMAB 300 MG/2ML ~~LOC~~ SOSY
300.0000 mg | PREFILLED_SYRINGE | SUBCUTANEOUS | Status: AC
  Administered 2023-12-22 – 2024-02-02 (×4): 300 mg via SUBCUTANEOUS

## 2023-11-24 ENCOUNTER — Ambulatory Visit (INDEPENDENT_AMBULATORY_CARE_PROVIDER_SITE_OTHER)

## 2023-11-24 DIAGNOSIS — L209 Atopic dermatitis, unspecified: Secondary | ICD-10-CM

## 2023-11-24 DIAGNOSIS — L2089 Other atopic dermatitis: Secondary | ICD-10-CM

## 2023-11-24 MED ORDER — DUPILUMAB 300 MG/2ML ~~LOC~~ SOAJ
300.0000 mg | Freq: Once | SUBCUTANEOUS | Status: AC
  Administered 2023-11-24: 300 mg via SUBCUTANEOUS

## 2023-11-24 NOTE — Progress Notes (Signed)
 Patient here today for two week Dupixent injection for severe atopic dermatitis.    Dupixent 300mg  pen injected into right upper arm. Patient tolerated injection well.  Patient given pen today to show spouse and patient how to self inject at home.    LOT: [id number] EXP: 05/13/2025   Dorathy Daft, RMA

## 2023-12-01 ENCOUNTER — Other Ambulatory Visit: Payer: Self-pay | Admitting: Internal Medicine

## 2023-12-05 ENCOUNTER — Encounter: Payer: Self-pay | Admitting: Internal Medicine

## 2023-12-08 ENCOUNTER — Ambulatory Visit

## 2023-12-08 DIAGNOSIS — L2089 Other atopic dermatitis: Secondary | ICD-10-CM | POA: Diagnosis not present

## 2023-12-08 MED ORDER — DUPILUMAB 300 MG/2ML ~~LOC~~ SOAJ
300.0000 mg | Freq: Once | SUBCUTANEOUS | Status: AC
  Administered 2023-12-08: 300 mg via SUBCUTANEOUS

## 2023-12-08 NOTE — Progress Notes (Signed)
 Patient here today for two week Dupixent injection for severe atopic dermatitis.    Dupixent 300mg  pen injected into left upper arm. Patient tolerated injection well.    LOT: [id number] EXP: 06/12/2025   Dorathy Daft, RMA

## 2023-12-22 ENCOUNTER — Ambulatory Visit

## 2023-12-22 ENCOUNTER — Ambulatory Visit (INDEPENDENT_AMBULATORY_CARE_PROVIDER_SITE_OTHER)

## 2023-12-22 DIAGNOSIS — L2089 Other atopic dermatitis: Secondary | ICD-10-CM | POA: Diagnosis not present

## 2023-12-22 NOTE — Progress Notes (Signed)
 Patient here today for two week Dupixent injection for severe atopic dermatitis.    Dupixent 300mg  pen injected into right upper arm. Patient tolerated injection well.    LOT: [id number]  EXP: 07/14/2025   Dorathy Daft, RMA

## 2024-01-04 ENCOUNTER — Ambulatory Visit

## 2024-01-04 DIAGNOSIS — L209 Atopic dermatitis, unspecified: Secondary | ICD-10-CM

## 2024-01-04 NOTE — Progress Notes (Signed)
 Patient here today for two week Dupixent  injection for severe atopic dermatitis.    Dupixent  300mg  pen injected into left upper arm. Patient tolerated injection well.    LOT: [id number] EXP: 01/2026   Cooper Denver, RMA

## 2024-01-05 ENCOUNTER — Ambulatory Visit

## 2024-01-18 ENCOUNTER — Ambulatory Visit

## 2024-01-19 ENCOUNTER — Ambulatory Visit

## 2024-01-19 DIAGNOSIS — L209 Atopic dermatitis, unspecified: Secondary | ICD-10-CM

## 2024-01-19 NOTE — Progress Notes (Signed)
 Patient here today for two week Dupixent  injection for severe atopic dermatitis.    Dupixent  300mg  pen injected into right upper arm. Patient tolerated injection well.    LOT: [id number] EXP: 01/2026   Lisbeth Rides, RMA

## 2024-02-02 ENCOUNTER — Ambulatory Visit (INDEPENDENT_AMBULATORY_CARE_PROVIDER_SITE_OTHER)

## 2024-02-02 DIAGNOSIS — L2089 Other atopic dermatitis: Secondary | ICD-10-CM | POA: Diagnosis not present

## 2024-02-02 NOTE — Progress Notes (Signed)
 Patient here today for two week Dupixent  injection for severe atopic dermatitis.    Dupixent  300mg  pen injected into left upper arm. Patient tolerated injection well.    LOT: [id number] EXP: 12/2025   Lisbeth Rides, RMA

## 2024-02-15 ENCOUNTER — Ambulatory Visit
Admission: RE | Admit: 2024-02-15 | Discharge: 2024-02-15 | Disposition: A | Discharge status: Home / Self Care | Source: Ambulatory Visit | Attending: Internal Medicine | Admitting: Internal Medicine

## 2024-02-15 DIAGNOSIS — C641 Malignant neoplasm of right kidney, except renal pelvis: Secondary | ICD-10-CM | POA: Diagnosis present

## 2024-02-16 ENCOUNTER — Ambulatory Visit

## 2024-02-28 ENCOUNTER — Inpatient Hospital Stay (HOSPITAL_BASED_OUTPATIENT_CLINIC_OR_DEPARTMENT_OTHER): Admitting: Internal Medicine

## 2024-02-28 ENCOUNTER — Encounter: Payer: Self-pay | Admitting: Internal Medicine

## 2024-02-28 ENCOUNTER — Inpatient Hospital Stay: Attending: Internal Medicine

## 2024-02-28 VITALS — BP 166/63 | HR 61 | Temp 97.6°F | Resp 19 | Ht 61.0 in | Wt 195.4 lb

## 2024-02-28 DIAGNOSIS — C641 Malignant neoplasm of right kidney, except renal pelvis: Secondary | ICD-10-CM

## 2024-02-28 DIAGNOSIS — Z79899 Other long term (current) drug therapy: Secondary | ICD-10-CM | POA: Insufficient documentation

## 2024-02-28 DIAGNOSIS — D696 Thrombocytopenia, unspecified: Secondary | ICD-10-CM | POA: Diagnosis not present

## 2024-02-28 DIAGNOSIS — I129 Hypertensive chronic kidney disease with stage 1 through stage 4 chronic kidney disease, or unspecified chronic kidney disease: Secondary | ICD-10-CM | POA: Diagnosis not present

## 2024-02-28 DIAGNOSIS — E559 Vitamin D deficiency, unspecified: Secondary | ICD-10-CM | POA: Diagnosis not present

## 2024-02-28 DIAGNOSIS — E1122 Type 2 diabetes mellitus with diabetic chronic kidney disease: Secondary | ICD-10-CM | POA: Diagnosis not present

## 2024-02-28 DIAGNOSIS — Z905 Acquired absence of kidney: Secondary | ICD-10-CM | POA: Insufficient documentation

## 2024-02-28 DIAGNOSIS — N184 Chronic kidney disease, stage 4 (severe): Secondary | ICD-10-CM | POA: Insufficient documentation

## 2024-02-28 LAB — CBC WITH DIFFERENTIAL (CANCER CENTER ONLY)
Abs Immature Granulocytes: 0.01 10*3/uL (ref 0.00–0.07)
Basophils Absolute: 0 10*3/uL (ref 0.0–0.1)
Basophils Relative: 0 %
Eosinophils Absolute: 0.2 10*3/uL (ref 0.0–0.5)
Eosinophils Relative: 4 %
HCT: 40.4 % (ref 36.0–46.0)
Hemoglobin: 12 g/dL (ref 12.0–15.0)
Immature Granulocytes: 0 %
Lymphocytes Relative: 13 %
Lymphs Abs: 0.7 10*3/uL (ref 0.7–4.0)
MCH: 27.3 pg (ref 26.0–34.0)
MCHC: 29.7 g/dL — ABNORMAL LOW (ref 30.0–36.0)
MCV: 91.8 fL (ref 80.0–100.0)
Monocytes Absolute: 0.5 10*3/uL (ref 0.1–1.0)
Monocytes Relative: 9 %
Neutro Abs: 3.7 10*3/uL (ref 1.7–7.7)
Neutrophils Relative %: 74 %
Platelet Count: 174 10*3/uL (ref 150–400)
RBC: 4.4 MIL/uL (ref 3.87–5.11)
RDW: 15.6 % — ABNORMAL HIGH (ref 11.5–15.5)
WBC Count: 5.1 10*3/uL (ref 4.0–10.5)
nRBC: 0 % (ref 0.0–0.2)

## 2024-02-28 LAB — CMP (CANCER CENTER ONLY)
ALT: 11 U/L (ref 0–44)
AST: 14 U/L — ABNORMAL LOW (ref 15–41)
Albumin: 4.1 g/dL (ref 3.5–5.0)
Alkaline Phosphatase: 116 U/L (ref 38–126)
Anion gap: 9 (ref 5–15)
BUN: 46 mg/dL — ABNORMAL HIGH (ref 8–23)
CO2: 19 mmol/L — ABNORMAL LOW (ref 22–32)
Calcium: 9.3 mg/dL (ref 8.9–10.3)
Chloride: 113 mmol/L — ABNORMAL HIGH (ref 98–111)
Creatinine: 2.53 mg/dL — ABNORMAL HIGH (ref 0.44–1.00)
GFR, Estimated: 19 mL/min — ABNORMAL LOW
Glucose, Bld: 95 mg/dL (ref 70–99)
Potassium: 4.3 mmol/L (ref 3.5–5.1)
Sodium: 141 mmol/L (ref 135–145)
Total Bilirubin: 0.7 mg/dL (ref 0.0–1.2)
Total Protein: 6.6 g/dL (ref 6.5–8.1)

## 2024-02-28 LAB — FERRITIN: Ferritin: 134 ng/mL (ref 11–307)

## 2024-02-28 LAB — IRON AND TIBC
Iron: 64 ug/dL (ref 28–170)
Saturation Ratios: 22 % (ref 10.4–31.8)
TIBC: 290 ug/dL (ref 250–450)
UIBC: 226 ug/dL

## 2024-02-28 LAB — LACTATE DEHYDROGENASE: LDH: 123 U/L (ref 98–192)

## 2024-02-28 LAB — VITAMIN D 25 HYDROXY (VIT D DEFICIENCY, FRACTURES): Vit D, 25-Hydroxy: 46.56 ng/mL (ref 30–100)

## 2024-02-28 NOTE — Assessment & Plan Note (Addendum)
#  Stage IIIa-clear-cell renal carcinoma-s/p  adjuvant [name]  x 1 year [APRIL 30th, 2024].. #  JUNE 2025- CT non- contrast. Right nephrectomy with no evidence for malignancy within the abdomen or pelvis  Will plan surveillance imaging every 6 months also.  # Mild thrombocytopenia > 100- monitor for now. stable  # Skin rash/pruritus -likely secondary to [name] -- improved- on duxpient ]Dr.Kowalski]- stable.   # Intermittently- elevated calcium- 10.4; STOP vit D 50k; today calcium is 10.0.  Vit D levels- 69 [SEP 2023]- stable.   # Anemia: Hemoglobin around 11-12  overall stable/improving; on PO iron every other day. NOV 2023- Iron sat-35%; ferretin- 330stable.    #CKD stage IV- GFR ~18; Continue enough hydration. Monitor closely. [s/p Dr.Korrapati Nephrology evaluation next week. 2023]- unable to refill- Fraxiga- overall stable.  FEB 205- [facility]-cards: NORMAL LEFT VENTRICULAR SYSTOLIC FUNCTION WITH MODERATE LVH - ESTIMATED EF: >55%; stable.     #Diabetes: patient OFF metformin;/glimepiride [as per nephrology]-  stable.   # Elevated BP/Poorly controlled- recommend follow up with PCP, Dr.hande.   # IV ACCESS: PIV.    # DISPOSITION: # follow up in MD 6 months-- labs-cbc/cmp; iron studies; ferritin; LDH; vit D25 [place]; CT CAP- non-contrast- Dr.B

## 2024-02-28 NOTE — Progress Notes (Signed)
 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  Histologic Grade (WHO / ISUP Grade): G3, nucleoli conspicuous and  eosinophilic at 100 X magnification  Tumor Extent: Extends into major vein (segmental branch adjacent to  renal sinus)  Sarcomatoid Features: Not identified  Rhabdoid Features: Not identified  Tumor Necrosis: Present, microscopic and macroscopic, 20%   MARGINS  Margin Status: All margins negative for invasive carcinoma   REGIONAL LYMPH NODES  Regional Lymph Node Status: Not applicable (no regional lymph nodes  submitted or found)   DISTANT METASTASIS  Distant Site(s) Involved, if applicable: Not applicable   PATHOLOGIC STAGE CLASSIFICATION (pTNM, AJCC 8th Edition):  TNM Descriptors: Applicable  pT3a  pN - Not assigned (no lymph nodes submitted or found)  pM - Not applicable   # PT3A- stage III; G-3; no sarcomatoid features [right nephrectomy- Dr.Sninksi].  # FEB 14th, 2023- CT AP [GI; [facility]]- . Large mass exophytic extending from lower to mid pole of the RIGHT kidney consistent with renal neoplasm. [Dr.Sninski; U[facility]log[facility]]  # FEB 2023- Right lower lobe subpleural 4 mm lung nodule; bone survey negative  #April 7th, 2023- On adjuvant [name]     # CKD III [ sec to HTN > 20 years; none now]; Anemia- EGD/colo- 2023- [[facility]]; Dr.Kowalski [No CAD]   Clear cell carcinoma of kidney, right (HCC)  12/12/2021 Initial Diagnosis   Clear cell renal cell carcinoma, right (HCC)   12/18/2021 - 04/05/2022 Chemotherapy   Patient is on Treatment Plan : RENAL CELL Pembrolizumab  (200) q21d     12/18/2021 -  Chemotherapy   Patient is on Treatment  Plan : kidney adjuvant Pembrolizumab  (200) q21d     01/08/2022 Cancer Staging   Staging form: Kidney, AJCC 8th Edition - Pathologic: Stage III (pT3a, pN0, cM0) - Signed by Gwyn Leos, MD on 01/08/2022     HISTORY OF PRESENTING ILLNESS: Ambulating independently.  Accompanied by husband.  Gabrielle Savage 78 y.o.  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   Rash resolved on dupxient.  No worsening cough.     Denies any worsening joint pains.  No nausea no vomiting.      Review of Systems  Constitutional:  Positive for malaise/fatigue. Negative for chills and fever.  HENT:  Negative for nosebleeds and sore throat.   Eyes:  Negative for double vision.  Respiratory:  Negative for cough, hemoptysis, sputum production and wheezing.   Cardiovascular:  Negative for chest pain, palpitations, orthopnea and leg swelling.  Gastrointestinal:  Negative for abdominal pain, blood in stool, constipation, diarrhea, heartburn, melena, nausea and vomiting.  Genitourinary:  Negative for dysuria, frequency and urgency.  Musculoskeletal:  Positive for back pain and joint pain.  Skin:  Positive for itching and rash.  Neurological:  Negative for dizziness, tingling, focal weakness, weakness and headaches.  Endo/Heme/Allergies:  Does not bruise/bleed easily.  Psychiatric/Behavioral:  Negative for depression. The patient is not nervous/anxious and does not have insomnia.      MEDICAL HISTORY:  Past Medical History:  Diagnosis Date   Aortic  atherosclerosis (HCC)    Arthritis    B12 deficiency    Bradycardia    CAD (coronary artery disease)    Carotid atherosclerosis    CKD (chronic kidney disease), stage III (HCC)    Diastolic dysfunction 09/03/2021   a.)  TTE 09/03/2021: EF 63%; no RWMAs; LA mildly enlarged; trivial  TR/PR, mild AR, moderate MR; G1DD.   DOE (dyspnea on exertion)    Fatigue    Gout    HLD (hyperlipidemia)    Hypertension    IDA (iron deficiency anemia)    Iliotibial band syndrome, left leg    MDD (major depressive disorder)    OAB (overactive bladder)    Obesity    PAC (premature atrial contraction)    noted on Holter   PONV (postoperative nausea and vomiting)    a.) single episode in 03/2013   PVC (premature ventricular contraction)    noted on Holter   Right kidney mass 10/27/2021   a.) CT 10/27/2021 -- mass measuring 8.3 x 6.8 x 8.1 cm   Sensorineural hearing loss (SNHL) of both ears    SUI (stress urinary incontinence, female)    SVT (supraventricular tachycardia) (HCC)    noted on Holter   T2DM (type 2 diabetes mellitus) (HCC)    Uterovaginal prolapse    Vitamin D  deficiency    Voiding dysfunction     SURGICAL HISTORY: Past Surgical History:  Procedure Laterality Date   ABDOMINAL HYSTERECTOMY     partial   BLADDER SUSPENSION     CHOLECYSTECTOMY     COLONOSCOPY N/A 07/25/2017   Procedure: COLONOSCOPY;  Surgeon: Deveron Fly, MD;  Location[facility];  Service: Endoscopy;  Laterality: N/A;   COLONOSCOPY WITH PROPOFOL  N/A 10/19/2021   Procedure: COLONOSCOPY WITH PROPOFOL ;  Surgeon: Shane Darling, MD;  Location: [facility] ENDOSCOPY;  Service: Endoscopy;  Laterality: N/A;  DM   ESOPHAGOGASTRODUODENOSCOPY (EGD) WITH PROPOFOL  N/A 10/19/2021   Procedure: ESOPHAGOGASTRODUODENOSCOPY (EGD) WITH PROPOFOL ;  Surgeon: Shane Darling, MD;  Location: [facility] ENDOSCOPY;  Service: Endoscopy;  Laterality: N/A;   JOINT REPLACEMENT Bilateral    PARTIAL KNEE REPLACEMENT   KNEE SURGERY     LAPAROSCOPIC NEPHRECTOMY, HAND ASSISTED Right 11/20/2021   Procedure: HAND ASSISTED LAPAROSCOPIC RADICAL NEPHRECTOMY;  Surgeon: Lawerence Pressman, MD;  Location: [facility] ORS;  Service: Urology;  Laterality: Right;   PARATHYROIDECTOMY      SOCIAL HISTORY: Social History   Socioeconomic  History   Marital status: Married    Spouse name: Not on file   Number of children: Not on file   Years of education: Not on file   Highest education level: Not on file  Occupational History   Not on file  Tobacco Use   Smoking status: Never    Passive exposure: Never   Smokeless tobacco: Never  Vaping Use   Vaping status: Never Used  Substance and Sexual Activity   Alcohol use: Not Currently    Comment: occ   Drug use: No   Sexual activity: Not Currently  Other Topics Concern   Not on file  Social History Narrative   Lives in  - close hospital; with husband; 3 biological children [[place]; [place]; [place]]. Never smoked; glass of wine rare. 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    Social Drivers of Corporate investment banker Strain: Low Risk  (11/04/2023)   Received from [facility]   Overall Financial Resource Strain (CARDIA)    Difficulty of Paying Living Expenses: Not hard  at all  Food Insecurity: No Food Insecurity (11/04/2023)   Received from [facility]   Hunger Vital Sign    Within the past 12 months, you worried that your food would run out before you got the money to buy more.: Never true    Within the past 12 months, the food you bought just didn't last and you didn't have money to get more.: Never true  Transportation Needs: No Transportation Needs (11/04/2023)   Received from [facility] - Transportation    In the past 12 months, has lack of transportation kept you from medical appointments or from getting medications?: No    Lack of Transportation (Non-Medical): No  Physical Activity: Insufficiently Active (12/07/2021)   Exercise Vital Sign    Days of Exercise per Week: 2 days    Minutes of Exercise per Session: 30 min  Stress: No Stress Concern Present (12/07/2021)   [employer] of Occupational Health - Occupational Stress Questionnaire    Feeling of Stress : Only a little   Social Connections: Moderately Integrated (12/07/2021)   Social Connection and Isolation Panel    Frequency of Communication with Friends and Family: Three times a week    Frequency of Social Gatherings with Friends and Family: Three times a week    Attends Religious Services: 1 to 4 times per year    Active Member of Clubs or Organizations: No    Attends Banker Meetings: Never    Marital Status: Married  Catering manager Violence: Not At Risk (12/07/2021)   Humiliation, Afraid, Rape, and Kick questionnaire    Fear of Current or Ex-Partner: No    Emotionally Abused: No    Physically Abused: No    Sexually Abused: No    FAMILY HISTORY: Family History  Problem Relation Age of Onset   Heart failure Mother    Stroke Father    Lung cancer Father    Breast cancer Neg Hx     ALLERGIES:  is allergic to codeine, nsaids, and oxycodone.  MEDICATIONS:  Current Outpatient Medications  Medication Sig Dispense Refill   Accu-Chek Softclix Lancets lancets USE 1 TO CHECK GLUCOSE ONCE DAILY     allopurinol (ZYLOPRIM) 100 MG tablet Take 100 mg by mouth every morning. Pt has decreased to 100mg      amLODipine (NORVASC) 5 MG tablet Take 5 mg by mouth every morning.     Dupilumab  (DUPIXENT ) 300 MG/2ML SOAJ Inject 300 mg into the skin every 14 (fourteen) days. Starting at day 15 for maintenance. 4 mL 5   ferrous sulfate 325 (65 FE) MG tablet Take 325 mg by mouth every other day.     gabapentin  (NEURONTIN ) 100 MG capsule Take 1 capsule by mouth twice daily 60 capsule 3   irbesartan (AVAPRO) 150 MG tablet Take 150 mg by mouth every morning.     mupirocin  ointment (BACTROBAN ) 2 % APPLY OINTMENT TOPICALLY ONCE TO  TWICE DAILY 22 g 0   sertraline (ZOLOFT) 100 MG tablet Take 100 mg by mouth daily.     triamcinolone  cream (KENALOG ) 0.1 % Apply to affected skin qd-bid prn itch, Avoid applying to face, groin, and axilla. Use as directed. Long-term use can cause thinning of the skin. 454 g 0    vitamin B-12 (CYANOCOBALAMIN) 1000 MCG tablet Take 1,000 mcg by mouth daily.     Current Facility-Administered Medications  Medication Dose Route Frequency Provider Last Rate Last Admin   dupilumab  (DUPIXENT ) prefilled  syringe 300 mg  300 mg Subcutaneous Q14 Days Elta Halter, MD   300 mg at 02/02/24 1145    Maculo-papular rash noted on upper torso; and lower extremities.   PHYSICAL EXAMINATION:  Vitals:   02/28/24 1005  BP: (!) 166/63  Pulse: 61  Resp: 19  Temp: 97.6 F (36.4 C)  SpO2: 97%      Filed Weights   02/28/24 1005  Weight: 195 lb 6.4 oz (88.6 kg)      Mild maculopapular rash noted in the back and also front.   Physical Exam Vitals and nursing note reviewed.  HENT:     Head: Normocephalic and atraumatic.     Mouth/Throat:     Pharynx: Oropharynx is clear.   Eyes:     Extraocular Movements: Extraocular movements intact.     Pupils: Pupils are equal, round, and reactive to light.    Cardiovascular:     Rate and Rhythm: Normal rate and regular rhythm.  Pulmonary:     Comments: Decreased breath sounds bilaterally.  Abdominal:     Palpations: Abdomen is soft.   Musculoskeletal:        General: Normal range of motion.     Cervical back: Normal range of motion.   Skin:    General: Skin is warm.   Neurological:     General: No focal deficit present.     Mental Status: She is alert and oriented to person, place, and time.   Psychiatric:        Behavior: Behavior normal.        Judgment: Judgment normal.      LABORATORY DATA:  I have reviewed the data as listed Lab Results  Component Value Date   WBC 5.1 02/28/2024   HGB 12.0 02/28/2024   HCT 40.4 02/28/2024   MCV 91.8 02/28/2024   PLT 174 02/28/2024   Recent Labs    08/16/23 1032 11/15/23 1010 02/28/24 1008  NA 144 141 141  K 4.5 4.6 4.3  CL 111 109 113*  CO2 22 23 19*  GLUCOSE 140* 140* 95  BUN 43* 47* 46*  CREATININE 2.73* 2.87* 2.53*  CALCIUM 9.1 8.9 9.3  GFRNONAA  18* 17* 19*  PROT 6.8 6.9 6.6  ALBUMIN 3.9 4.2 4.1  AST 16 24 14*  ALT 15 21 11   ALKPHOS 136* 129* 116  BILITOT 0.5 0.6 0.7    RADIOGRAPHIC STUDIES: I have personally reviewed the radiological images as listed and agreed with the findings in the report. CT ABDOMEN PELVIS WO CONTRAST Result Date: 02/15/2024 EXAMINATION: CT ABDOMEN PELVIS WO CONTRAST CLINICAL INDICATION: Female, 78 years old. Kidney cancer, recurrence; cancer- kidney TECHNIQUE: Helical CT scan examination of the abdomen and pelvis is performed from the domes of the diaphragm to the pubic symphysis. Limited evaluation of the solid organs due to lack of intravenous contrast. Unless otherwise specified, incidental thyroid , adrenal, renal lesions do not require dedicated imaging follow up. Additionally, any mentioned pulmonary nodules do not require dedicated imaging follow-up based on the Fleischner guidelines unless otherwise specified. Coronary calcifications are not identified unless otherwise specified. COMPARISON: 08/08/2023 FINDINGS: There are minimal atelectatic changes in the lung bases. The heart is normal in size. There are coronary calcifications. There is trace pericardial fluid. Trace right pleural effusion noted. The liver contains a cyst. The gallbladder surgically absent. The spleen is normal. The pancreas is normal. Adrenals are normal. Left renal cysts are seen. The right kidney is surgically absent. The abdominal aorta is  normal in caliber. Scattered calcified atherosclerotic changes are present. The bladder is normal. The uterus is surgically absent. There is colonic diverticulosis. The appendix is normal. Large and small bowel loops are otherwise within normal limits. No free fluid or pathologic lymphadenopathy by size criteria. There are degenerative changes of the spine and bony pelvis. IMPRESSION: Right nephrectomy with no evidence for malignancy within the abdomen or pelvis. DOSE REDUCTION: This exam was performed  according to our departmental dose-optimization program which includes automated exposure control, adjustment of the mA and/or kV according to patient size and/or use of iterative reconstruction technique. Electronically signed by: [place] Engel MD 02/15/2024 05:47 PM EDT RP Workstation: [id number]    Clear cell carcinoma of kidney, right (HCC) #Stage IIIa-clear-cell renal carcinoma-s/p  adjuvant [name]  x 1 year [APRIL 30th, 2024].. #  JUNE 2025- CT non- contrast. Right nephrectomy with no evidence for malignancy within the abdomen or pelvis  Will plan surveillance imaging every 6 months also.  # Mild thrombocytopenia > 100- monitor for now. stable  # Skin rash/pruritus -likely secondary to [name] -- improved- on duxpient ]Dr.Kowalski]- stable.   # Intermittently- elevated calcium- 10.4; STOP vit D 50k; today calcium is 10.0.  Vit D levels- 69 [SEP 2023]- stable.   # Anemia: Hemoglobin around 11-12  overall stable/improving; on PO iron every other day. NOV 2023- Iron sat-35%; ferretin- 330stable.    #CKD stage IV- GFR ~18; Continue enough hydration. Monitor closely. [s/p Dr.Korrapati Nephrology evaluation next week. 2023]- unable to refill- Fraxiga- overall stable.  FEB 205- [facility]-cards: NORMAL LEFT VENTRICULAR SYSTOLIC FUNCTION WITH MODERATE LVH - ESTIMATED EF: >55%; stable.     #Diabetes: patient OFF metformin;/glimepiride [as per nephrology]-  stable.   # Elevated BP/Poorly controlled- recommend follow up with PCP, Dr.hande.   # IV ACCESS: PIV.    # DISPOSITION: # follow up in MD 6 months-- labs-cbc/cmp; iron studies; ferritin; LDH; vit D25 OH; CT CAP- non-contrast- Dr.B   All questions were answered. The patient knows to call the clinic with any problems, questions or concerns.   Gwyn Leos, MD 02/28/2024 11:12 AM

## 2024-02-28 NOTE — Progress Notes (Signed)
 Patient has no concerns

## 2024-02-29 ENCOUNTER — Other Ambulatory Visit: Payer: Self-pay

## 2024-03-01 ENCOUNTER — Other Ambulatory Visit: Payer: Self-pay

## 2024-03-19 ENCOUNTER — Ambulatory Visit (INDEPENDENT_AMBULATORY_CARE_PROVIDER_SITE_OTHER): Admitting: Dermatology

## 2024-03-19 ENCOUNTER — Encounter: Payer: Self-pay | Admitting: Dermatology

## 2024-03-19 DIAGNOSIS — L308 Other specified dermatitis: Secondary | ICD-10-CM

## 2024-03-19 DIAGNOSIS — Z79899 Other long term (current) drug therapy: Secondary | ICD-10-CM

## 2024-03-19 DIAGNOSIS — L281 Prurigo nodularis: Secondary | ICD-10-CM

## 2024-03-19 DIAGNOSIS — T148XXD Other injury of unspecified body region, subsequent encounter: Secondary | ICD-10-CM | POA: Diagnosis not present

## 2024-03-19 DIAGNOSIS — L209 Atopic dermatitis, unspecified: Secondary | ICD-10-CM

## 2024-03-19 DIAGNOSIS — Z7189 Other specified counseling: Secondary | ICD-10-CM

## 2024-03-19 DIAGNOSIS — L299 Pruritus, unspecified: Secondary | ICD-10-CM | POA: Diagnosis not present

## 2024-03-19 DIAGNOSIS — L2089 Other atopic dermatitis: Secondary | ICD-10-CM

## 2024-03-19 DIAGNOSIS — T50905S Adverse effect of unspecified drugs, medicaments and biological substances, sequela: Secondary | ICD-10-CM

## 2024-03-19 NOTE — Patient Instructions (Signed)

## 2024-03-19 NOTE — Progress Notes (Signed)
 Follow-Up Visit   Subjective  Gabrielle Savage is a 78 y.o. female who presents for the following: Atopic Dermatitis follow up, currently on Dupixent  injections since December.  Reports has been improving on Dupixent . Pt no longer itching. Pt hasn't noticed any eye pain or dryness associated with Dupixent .  The following portions of the chart were reviewed this encounter and updated as appropriate: medications, allergies, medical history  Review of Systems:  No other skin or systemic complaints except as noted in HPI or Assessment and Plan.  Objective  Well appearing patient in no apparent distress; mood and affect are within normal limits.  Areas Examined: Back, b/l shoulders   Relevant physical exam findings are noted in the Assessment and Plan.   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 And Rash = Biopsy proven : Interface dermatitis with negative DIF (=direct immunofluorescence) (see path)  with positive ANA, positive RNP,  secondary to drug reacton ([name] ? For Kidney CA) vs lupus vs dermatomyositis vs other    Pt seen by Dr Tobie = Rheumatology and put on Plaquenil.   Pt still with significant rash and symptoms and even systemic steroids not helping but temporarily. We would like to avoid repeat systemic steroids if possible. Dr B, pts oncologist originally sent pt for evaluation and for considerations for treatment options.  Exam: Small crusts of the back, but much improved. 15 % BSA  Chronic and persistent condition with duration or expected duration over one year. Condition is improving with Dupixent  treatment but not currently at goal.  Atopic dermatitis - Severe, on Dupixent  (biologic medication).  Atopic dermatitis (eczema) is a chronic, relapsing, pruritic condition that can significantly affect quality of life. It is often associated with allergic rhinitis and/or asthma and can require  treatment with topical medications, phototherapy, or in severe cases biologic medications, which require long term medication management.    Treatment Plan: Patient has tried and failed tmc cream.  Continue Plaquenil as prescribed by Dr Tobie - pt may discuss with Dr Tobie if she wants to try to D/C Plaquenil Cont Allegra or Claritin daily prn  Continue Dupixent  300 mg / 2 ml injections q2 weeks. Pt's husband injecting patient at home with no issues. If patient doing well in 3-4 months will try stopping medication.  Continue TMC 0.1% cream to aa's BID.    Recommend Sarna with menthol OTC moisturizing lotion to help decrease itching sensation.   Discussed Nemluvio as another option in future. Would consider monthly injections to as to 2 weeks injections   Recommend gentle skin care.  Potential side effects include allergic reaction, herpes infections, injection site reactions and conjunctivitis (inflammation of the eyes).  The use of Dupixent  requires long term medication management, including periodic office visits.    Long term medication management.  Patient is using long term (months to years) prescription medication  to control their dermatologic condition.  These medications require periodic monitoring to evaluate for efficacy and side effects and may require periodic laboratory monitoring.   Recommend gentle skin care.  Pt c/o leg pain that started about a month ago. Not related to Dupixent . Pt seeing Dr. Tobie this week and will follow up with him.  Return in about 4 months (around 07/20/2024) for Dupixent  follow up.  LILLETTE [name], CMA, am acting as scribe for Alm Rhyme, MD .  Documentation: I have reviewed the above documentation for accuracy and completeness, and I  agree with the above.  Alm Rhyme, MD

## 2024-04-07 ENCOUNTER — Other Ambulatory Visit: Payer: Self-pay | Admitting: Internal Medicine

## 2024-04-09 ENCOUNTER — Encounter: Payer: Self-pay | Admitting: Internal Medicine

## 2024-05-07 ENCOUNTER — Other Ambulatory Visit: Payer: Self-pay | Admitting: Internal Medicine

## 2024-05-07 DIAGNOSIS — Z1231 Encounter for screening mammogram for malignant neoplasm of breast: Secondary | ICD-10-CM

## 2024-05-10 ENCOUNTER — Telehealth: Payer: Self-pay | Admitting: *Deleted

## 2024-05-10 MED ORDER — GABAPENTIN 100 MG PO CAPS: 100.0000 mg | ORAL_CAPSULE | Freq: Two times a day (BID) | ORAL | 0 refills | Status: DC

## 2024-05-10 NOTE — Telephone Encounter (Signed)
 I called the patient at let her know that we put in for refill and Dr. Rennie will it approve later in today or tom. . Pt .has 5 days know.

## 2024-06-04 ENCOUNTER — Other Ambulatory Visit: Payer: Self-pay | Admitting: Internal Medicine

## 2024-06-13 ENCOUNTER — Ambulatory Visit
Admission: RE | Admit: 2024-06-13 | Discharge: 2024-06-13 | Disposition: A | Discharge status: Home / Self Care | Source: Ambulatory Visit | Attending: Internal Medicine | Admitting: Internal Medicine

## 2024-06-13 DIAGNOSIS — Z1231 Encounter for screening mammogram for malignant neoplasm of breast: Secondary | ICD-10-CM | POA: Diagnosis present

## 2024-06-14 ENCOUNTER — Other Ambulatory Visit: Payer: Self-pay

## 2024-06-14 MED ORDER — DUPIXENT 300 MG/2ML ~~LOC~~ SOAJ: 300.0000 mg | SUBCUTANEOUS | 1 refills | Status: DC

## 2024-07-31 ENCOUNTER — Other Ambulatory Visit: Payer: Self-pay | Admitting: Internal Medicine

## 2024-07-31 ENCOUNTER — Ambulatory Visit: Admitting: Dermatology

## 2024-07-31 DIAGNOSIS — L308 Other specified dermatitis: Secondary | ICD-10-CM

## 2024-07-31 DIAGNOSIS — R21 Rash and other nonspecific skin eruption: Secondary | ICD-10-CM

## 2024-07-31 DIAGNOSIS — T50905D Adverse effect of unspecified drugs, medicaments and biological substances, subsequent encounter: Secondary | ICD-10-CM | POA: Diagnosis not present

## 2024-07-31 DIAGNOSIS — Z7189 Other specified counseling: Secondary | ICD-10-CM

## 2024-07-31 NOTE — Patient Instructions (Signed)

## 2024-07-31 NOTE — Progress Notes (Unsigned)
   Follow-Up Visit   Subjective  Gabrielle Savage is a 78 y.o. female who presents for the following: 3 months f/u on rash on her body started after patient took [name]  for kidney cancer, started patient on Dupixent  injection 08/30/2023 helped, rash is clear for at least 1 month now, patient stopped Dupixent  about 3 weeks ago. Patient is very pleased with her skin and has no concerns.  The following portions of the chart were reviewed this encounter and updated as appropriate: medications, allergies, medical history  husband is with patient and contributes to history.   Review of Systems:  No other skin or systemic complaints except as noted in HPI or Assessment and Plan.  Objective  Well appearing patient in no apparent distress; mood and affect are within normal limits.  A focused examination was performed of the following areas: Face,chest,back and shoulders   Relevant exam findings are noted in the Assessment and Plan.    Assessment & Plan   Rash secondary to drug reaction to [name]  for kidney cancer.   Biopsy proven : Interface dermatitis with negative DIF (=direct immunofluorescence) (see path)  with positive ANA, positive RNP,  secondary to drug reacton ([name] ? For Kidney CA) vs lupus vs dermatomyositis vs other    Patient ultimately did quite well on systemic treatment with Dupixent .  She had significant improvement of itching and rash subsided she has had no problems for a while and has been off Dupixent  for about a month without recurrence.  She would like to stay off it if possible.  Exam: scarring on the shoulders.  No active excoriations or erythema or rash today.   Chronic condition with duration or expected duration over one year. Currently well-controlled and clear and remains clear after 1 month off Dupixent .   Treatment Plan: D/C Dupixent  injection, if rash flares back up, restart Dupixent  injections.   Recommend gentle skin care.     Return if symptoms  worsen or fail to improve.  IFay Kirks, CMA, am acting as scribe for Alm Rhyme, MD .   Documentation: I have reviewed the above documentation for accuracy and completeness, and I agree with the above.  Alm Rhyme, MD

## 2024-08-01 ENCOUNTER — Encounter: Payer: Self-pay | Admitting: Dermatology

## 2024-08-29 ENCOUNTER — Ambulatory Visit
Admission: RE | Admit: 2024-08-29 | Discharge: 2024-08-29 | Disposition: A | Source: Ambulatory Visit | Attending: Internal Medicine

## 2024-08-29 DIAGNOSIS — C641 Malignant neoplasm of right kidney, except renal pelvis: Secondary | ICD-10-CM | POA: Diagnosis present

## 2024-08-30 ENCOUNTER — Other Ambulatory Visit: Payer: Self-pay | Admitting: Surgery

## 2024-08-31 ENCOUNTER — Other Ambulatory Visit: Payer: Self-pay

## 2024-09-10 ENCOUNTER — Encounter
Admission: RE | Admit: 2024-09-10 | Discharge: 2024-09-10 | Disposition: A | Discharge status: Home / Self Care | Source: Ambulatory Visit | Attending: Surgery | Admitting: Surgery

## 2024-09-10 ENCOUNTER — Encounter: Payer: Self-pay | Admitting: Internal Medicine

## 2024-09-10 ENCOUNTER — Other Ambulatory Visit: Payer: Self-pay

## 2024-09-10 ENCOUNTER — Telehealth: Payer: Self-pay | Admitting: Internal Medicine

## 2024-09-10 ENCOUNTER — Inpatient Hospital Stay (HOSPITAL_BASED_OUTPATIENT_CLINIC_OR_DEPARTMENT_OTHER): Admitting: Internal Medicine

## 2024-09-10 ENCOUNTER — Inpatient Hospital Stay: Attending: Internal Medicine

## 2024-09-10 VITALS — BP 156/78 | HR 69 | Temp 98.0°F | Ht 61.0 in | Wt 202.0 lb

## 2024-09-10 VITALS — BP 152/60 | HR 73 | Temp 97.8°F | Resp 17 | Ht 61.0 in | Wt 205.0 lb

## 2024-09-10 DIAGNOSIS — D696 Thrombocytopenia, unspecified: Secondary | ICD-10-CM | POA: Insufficient documentation

## 2024-09-10 DIAGNOSIS — E1122 Type 2 diabetes mellitus with diabetic chronic kidney disease: Secondary | ICD-10-CM | POA: Insufficient documentation

## 2024-09-10 DIAGNOSIS — Z79899 Other long term (current) drug therapy: Secondary | ICD-10-CM | POA: Insufficient documentation

## 2024-09-10 DIAGNOSIS — C641 Malignant neoplasm of right kidney, except renal pelvis: Secondary | ICD-10-CM | POA: Insufficient documentation

## 2024-09-10 DIAGNOSIS — D649 Anemia, unspecified: Secondary | ICD-10-CM | POA: Diagnosis not present

## 2024-09-10 DIAGNOSIS — E559 Vitamin D deficiency, unspecified: Secondary | ICD-10-CM

## 2024-09-10 DIAGNOSIS — Z905 Acquired absence of kidney: Secondary | ICD-10-CM | POA: Diagnosis not present

## 2024-09-10 DIAGNOSIS — I129 Hypertensive chronic kidney disease with stage 1 through stage 4 chronic kidney disease, or unspecified chronic kidney disease: Secondary | ICD-10-CM | POA: Insufficient documentation

## 2024-09-10 DIAGNOSIS — Z01818 Encounter for other preprocedural examination: Secondary | ICD-10-CM | POA: Insufficient documentation

## 2024-09-10 DIAGNOSIS — N184 Chronic kidney disease, stage 4 (severe): Secondary | ICD-10-CM | POA: Diagnosis not present

## 2024-09-10 DIAGNOSIS — M1612 Unilateral primary osteoarthritis, left hip: Secondary | ICD-10-CM | POA: Diagnosis not present

## 2024-09-10 DIAGNOSIS — Z801 Family history of malignant neoplasm of trachea, bronchus and lung: Secondary | ICD-10-CM | POA: Diagnosis not present

## 2024-09-10 DIAGNOSIS — L299 Pruritus, unspecified: Secondary | ICD-10-CM | POA: Diagnosis not present

## 2024-09-10 LAB — URINALYSIS, ROUTINE W REFLEX MICROSCOPIC
Bilirubin Urine: NEGATIVE
Glucose, UA: 150 mg/dL — AB
Hgb urine dipstick: NEGATIVE
Ketones, ur: NEGATIVE mg/dL
Leukocytes,Ua: NEGATIVE
Nitrite: NEGATIVE
Protein, ur: 100 mg/dL — AB
Specific Gravity, Urine: 1.008 (ref 1.005–1.030)
pH: 5 (ref 5.0–8.0)

## 2024-09-10 LAB — IRON AND TIBC
Iron: 63 ug/dL (ref 28–170)
Saturation Ratios: 22 % (ref 10.4–31.8)
TIBC: 283 ug/dL (ref 250–450)
UIBC: 220 ug/dL

## 2024-09-10 LAB — FERRITIN: Ferritin: 312 ng/mL — ABNORMAL HIGH (ref 11–307)

## 2024-09-10 LAB — CMP (CANCER CENTER ONLY)
ALT: 20 U/L (ref 0–44)
AST: 18 U/L (ref 15–41)
Albumin: 4.2 g/dL (ref 3.5–5.0)
Alkaline Phosphatase: 196 U/L — ABNORMAL HIGH (ref 38–126)
Anion gap: 13 (ref 5–15)
BUN: 37 mg/dL — ABNORMAL HIGH (ref 8–23)
CO2: 21 mmol/L — ABNORMAL LOW (ref 22–32)
Calcium: 10 mg/dL (ref 8.9–10.3)
Chloride: 108 mmol/L (ref 98–111)
Creatinine: 2.64 mg/dL — ABNORMAL HIGH (ref 0.44–1.00)
GFR, Estimated: 18 mL/min — ABNORMAL LOW
Glucose, Bld: 129 mg/dL — ABNORMAL HIGH (ref 70–99)
Potassium: 4.4 mmol/L (ref 3.5–5.1)
Sodium: 142 mmol/L (ref 135–145)
Total Bilirubin: 0.3 mg/dL (ref 0.0–1.2)
Total Protein: 6.5 g/dL (ref 6.5–8.1)

## 2024-09-10 LAB — CBC WITH DIFFERENTIAL (CANCER CENTER ONLY)
Abs Immature Granulocytes: 0.02 K/uL (ref 0.00–0.07)
Basophils Absolute: 0 K/uL (ref 0.0–0.1)
Basophils Relative: 1 %
Eosinophils Absolute: 0.2 K/uL (ref 0.0–0.5)
Eosinophils Relative: 4 %
HCT: 40.6 % (ref 36.0–46.0)
Hemoglobin: 12.7 g/dL (ref 12.0–15.0)
Immature Granulocytes: 0 %
Lymphocytes Relative: 12 %
Lymphs Abs: 0.7 K/uL (ref 0.7–4.0)
MCH: 29.4 pg (ref 26.0–34.0)
MCHC: 31.3 g/dL (ref 30.0–36.0)
MCV: 94 fL (ref 80.0–100.0)
Monocytes Absolute: 0.5 K/uL (ref 0.1–1.0)
Monocytes Relative: 9 %
Neutro Abs: 4.1 K/uL (ref 1.7–7.7)
Neutrophils Relative %: 74 %
Platelet Count: 163 K/uL (ref 150–400)
RBC: 4.32 MIL/uL (ref 3.87–5.11)
RDW: 13.5 % (ref 11.5–15.5)
WBC Count: 5.5 K/uL (ref 4.0–10.5)
nRBC: 0 % (ref 0.0–0.2)

## 2024-09-10 LAB — TYPE AND SCREEN
ABO/RH(D): O NEG
Antibody Screen: NEGATIVE

## 2024-09-10 LAB — VITAMIN D 25 HYDROXY (VIT D DEFICIENCY, FRACTURES): Vit D, 25-Hydroxy: 23.6 ng/mL — ABNORMAL LOW (ref 30–100)

## 2024-09-10 LAB — SURGICAL PCR SCREEN
MRSA, PCR: NEGATIVE
Staphylococcus aureus: NEGATIVE

## 2024-09-10 LAB — LACTATE DEHYDROGENASE: LDH: 183 U/L (ref 105–235)

## 2024-09-10 NOTE — Progress Notes (Signed)
 Has some fatigue. Denies and abdominal or pelvic pain. Denies any blood in urine.Having a left hip replacement next week. Appetite is good.

## 2024-09-10 NOTE — Progress Notes (Signed)
 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  Histologic Grade (WHO / ISUP Grade): G3, nucleoli conspicuous and  eosinophilic at 100 X magnification  Tumor Extent: Extends into major vein (segmental branch adjacent to  renal sinus)  Sarcomatoid Features: Not identified  Rhabdoid Features: Not identified  Tumor Necrosis: Present, microscopic and macroscopic, 20%   MARGINS  Margin Status: All margins negative for invasive carcinoma   REGIONAL LYMPH NODES  Regional Lymph Node Status: Not applicable (no regional lymph nodes  submitted or found)   DISTANT METASTASIS  Distant Site(s) Involved, if applicable: Not applicable   PATHOLOGIC STAGE CLASSIFICATION (pTNM, AJCC 8th Edition):  TNM Descriptors: Applicable  pT3a  pN - Not assigned (no lymph nodes submitted or found)  pM - Not applicable   # PT3A- stage III; G-3; no sarcomatoid features [right nephrectomy- Dr.Sninksi].  # FEB 14th, 2023- CT AP [GI; [facility]]- . Large mass exophytic extending from lower to mid pole of the RIGHT kidney consistent with renal neoplasm. [Dr.Sninski; [facility]log[facility]]  # FEB 2023- Right lower lobe subpleural 4 mm lung nodule; bone survey negative  #April 7th, 2023- On adjuvant [name]     # CKD III [ sec to HTN > 20 years; none now]; Anemia- EGD/colo- 2023- [[facility]-GI]; Dr.Kowalski [No CAD]   Clear cell carcinoma of kidney, right (HCC)  12/12/2021 Initial Diagnosis   Clear cell renal cell carcinoma, right (HCC)   12/18/2021 - 04/05/2022 Chemotherapy   Patient is on Treatment Plan : RENAL CELL Pembrolizumab  (200) q21d     12/18/2021 -  Chemotherapy   Patient is on Treatment  Plan : kidney adjuvant Pembrolizumab  (200) q21d     01/08/2022 Cancer Staging   Staging form: Kidney, AJCC 8th Edition - Pathologic: Stage III (pT3a, pN0, cM0) - Signed by Rennie Cindy SAUNDERS, MD on 01/08/2022     HISTORY OF PRESENTING ILLNESS: Ambulating independently.  Accompanied by husband.  Gabrielle Savage 78 y.o.  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   Discussed the use of AI scribe software for clinical note transcription with the patient, who gave verbal consent to proceed.  History of Present Illness   Gabrielle Savage is a 78 year old female with stage III right renal cell carcinoma, status post nephrectomy and adjuvant pembrolizumab , who presents for routine oncology surveillance.  She completed adjuvant pembrolizumab  following right nephrectomy for stage III right renal cell carcinoma and is currently in remission. Recent CT imaging demonstrated no evidence of recurrent or metastatic disease. Her platelet count, which had previously been mildly decreased, is now reported as normal in today's laboratory results.  She previously experienced significant pruritus, which resolved after treatment with Dupilumab . She discontinued Dupilumab  several months ago and reports complete resolution of pruritus, with no recurrence or need for further therapy.  She is followed by nephrology for chronic kidney disease and diabetes, with laboratory studies obtained today and a nephrology appointment scheduled for next week. She is awaiting results of current kidney function tests.  She is scheduled for left hip  arthroplasty next week due to severe osteoarthritis.     Review of Systems  Constitutional:  Positive for malaise/fatigue. Negative for chills and fever.  HENT:  Negative for nosebleeds and sore  throat.   Eyes:  Negative for double vision.  Respiratory:  Negative for cough, hemoptysis, sputum production and wheezing.   Cardiovascular:  Negative for chest pain, palpitations, orthopnea and leg swelling.  Gastrointestinal:  Negative for abdominal pain, blood in stool, constipation, diarrhea, heartburn, melena, nausea and vomiting.  Genitourinary:  Negative for dysuria, frequency and urgency.  Musculoskeletal:  Positive for back pain and joint pain.  Skin:  Positive for itching and rash.  Neurological:  Negative for dizziness, tingling, focal weakness, weakness and headaches.  Endo/Heme/Allergies:  Does not bruise/bleed easily.  Psychiatric/Behavioral:  Negative for depression. The patient is not nervous/anxious and does not have insomnia.      MEDICAL HISTORY:  Past Medical History:  Diagnosis Date   Aortic atherosclerosis    Arthritis    B12 deficiency    Bradycardia    CAD (coronary artery disease)    Carotid atherosclerosis    CKD (chronic kidney disease), stage III (HCC)    Diastolic dysfunction 09/03/2021   a.)  TTE 09/03/2021: EF 63%; no RWMAs; LA mildly enlarged; trivial TR/PR, mild AR, moderate MR; G1DD.   DOE (dyspnea on exertion)    Fatigue    Gout    HLD (hyperlipidemia)    Hypertension    IDA (iron deficiency anemia)    Iliotibial band syndrome, left leg    MDD (major depressive disorder)    OAB (overactive bladder)    Obesity    PAC (premature atrial contraction)    noted on Holter   PONV (postoperative nausea and vomiting)    a.) single episode in 03/2013   PVC (premature ventricular contraction)    noted on Holter   Right kidney mass 10/27/2021   a.) CT 10/27/2021 -- mass measuring 8.3 x 6.8 x 8.1 cm   Sensorineural hearing loss (SNHL) of both ears    SUI (stress urinary incontinence, female)    SVT (supraventricular tachycardia)    noted on Holter   T2DM (type 2 diabetes mellitus) (HCC)    Uterovaginal prolapse    Vitamin D  deficiency    Voiding  dysfunction     SURGICAL HISTORY: Past Surgical History:  Procedure Laterality Date   ABDOMINAL HYSTERECTOMY     partial   BLADDER SUSPENSION     CHOLECYSTECTOMY     COLONOSCOPY N/A 07/25/2017   Procedure: COLONOSCOPY;  Surgeon: Gaylyn Gladis PENNER, MD;  Location: [facility] ENDOSCOPY;  Service: Endoscopy;  Laterality: N/A;   COLONOSCOPY WITH PROPOFOL  N/A 10/19/2021   Procedure: COLONOSCOPY WITH PROPOFOL ;  Surgeon: Maryruth Ole DASEN, MD;  Location: [facility] ENDOSCOPY;  Service: Endoscopy;  Laterality: N/A;  DM   ESOPHAGOGASTRODUODENOSCOPY (EGD) WITH PROPOFOL  N/A 10/19/2021   Procedure: ESOPHAGOGASTRODUODENOSCOPY (EGD) WITH PROPOFOL ;  Surgeon: Maryruth Ole DASEN, MD;  Location: [facility] ENDOSCOPY;  Service: Endoscopy;  Laterality: N/A;   JOINT REPLACEMENT Bilateral    PARTIAL KNEE REPLACEMENT   KNEE SURGERY     LAPAROSCOPIC NEPHRECTOMY, HAND ASSISTED Right 11/20/2021   Procedure: HAND ASSISTED LAPAROSCOPIC RADICAL NEPHRECTOMY;  Surgeon: Francisca Redell BROCKS, MD;  Location: [facility] ORS;  Service: Urology;  Laterality: Right;   PARATHYROIDECTOMY      SOCIAL HISTORY: Social History   Socioeconomic History   Marital status: Married    Spouse name: Not on file   Number of children: Not on file  Years of education: Not on file   Highest education level: Not on file  Occupational History   Not on file  Tobacco Use   Smoking status: Never    Passive exposure: Never   Smokeless tobacco: Never  Vaping Use   Vaping status: Never Used  Substance and Sexual Activity   Alcohol use: Not Currently    Comment: occ   Drug use: No   Sexual activity: Not Currently  Other Topics Concern   Not on file  Social History Narrative   Lives in [place]; with husband; 3 biological children [[place]; [place]; [place]]. Never smoked; glass of wine rare. 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    Social Drivers of Health   Tobacco Use: Low Risk (09/10/2024)   Patient History    Smoking Tobacco  Use: Never    Smokeless Tobacco Use: Never    Passive Exposure: Never  Financial Resource Strain: Low Risk  (09/04/2024)   Received from [facility]   Overall Financial Resource Strain (CARDIA)    Difficulty of Paying Living Expenses: Not hard at all  Food Insecurity: No Food Insecurity (09/04/2024)   Received from [facility]   Epic    Within the past 12 months, you worried that your food would run out before you got the money to buy more.: Never true    Within the past 12 months, the food you bought just didn't last and you didn't have money to get more.: Never true  Transportation Needs: No Transportation Needs (09/04/2024)   Received from [facility] - Transportation    In the past 12 months, has lack of transportation kept you from medical appointments or from getting medications?: No    Lack of Transportation (Non-Medical): No  Physical Activity: Insufficiently Active (12/07/2021)   Exercise Vital Sign    Days of Exercise per Week: 2 days    Minutes of Exercise per Session: 30 min  Stress: No Stress Concern Present (12/07/2021)   [employer] - Occupational Stress Questionnaire    Feeling of Stress : Only a little  Social Connections: Moderately Integrated (12/07/2021)   Social Connection and Isolation Panel    Frequency of Communication with Friends and Family: Three times a week    Frequency of Social Gatherings with Friends and Family: Three times a week    Attends Religious Services: 1 to 4 times per year    Active Member of Clubs or Organizations: No    Attends Banker Meetings: Never    Marital Status: Married  Catering Manager Violence: Not At Risk (12/07/2021)   Humiliation, Afraid, Rape, and Kick questionnaire    Fear of Current or Ex-Partner: No    Emotionally Abused: No    Physically Abused: No    Sexually Abused: No  Depression (PHQ2-9): Low Risk (12/07/2021)    Depression (PHQ2-9)    PHQ-2 Score: 1  Alcohol Screen: Low Risk (12/07/2021)   Alcohol Screen    Last Alcohol Screening Score (AUDIT): 0  Housing: Low Risk  (09/04/2024)   Received from [facility]   Epic    In the last 12 months, was there a time when you were not able to pay the mortgage or rent on time?: No    In the past 12 months, how many times have you moved where you were living?: 0    At any time in the past 12 months,  were you homeless or living in a shelter (including now)?: No  Utilities: Not At Risk (09/04/2024)   Received from [facility]   Epic    In the past 12 months has the electric, gas, oil, or water  company threatened to shut off services in your home?: No  Health Literacy: Not on file    FAMILY HISTORY: Family History  Problem Relation Age of Onset   Heart failure Mother    Stroke Father    Lung cancer Father    Breast cancer Neg Hx     ALLERGIES:  is allergic to codeine, nsaids, and oxycodone.  MEDICATIONS:  Current Outpatient Medications  Medication Sig Dispense Refill   Accu-Chek Softclix Lancets lancets USE 1 TO CHECK GLUCOSE ONCE DAILY     allopurinol (ZYLOPRIM) 100 MG tablet Take 100 mg by mouth in the morning.     amLODipine (NORVASC) 2.5 MG tablet Take 2.5 mg by mouth in the morning. 2.5 mg + 5 mg=7.5 mg     amLODipine (NORVASC) 5 MG tablet Take 5 mg by mouth in the morning. 5 mg + 2.5 mg=7.5 mg     calcitRIOL (ROCALTROL) 0.25 MCG capsule Take 0.25 mcg by mouth every other day.     cyanocobalamin (VITAMIN B12) 1000 MCG tablet Take 1,000 mcg by mouth in the morning.     dapagliflozin propanediol (FARXIGA) 5 MG TABS tablet Take 5 mg by mouth daily.     ferrous sulfate 325 (65 FE) MG tablet Take 325 mg by mouth every other day.     gabapentin  (NEURONTIN ) 100 MG capsule Take 100 mg by mouth in the morning and at bedtime.     hydroxychloroquine (PLAQUENIL) 200 MG tablet Take 200 mg by mouth 2 (two) times daily.      irbesartan (AVAPRO) 150 MG tablet Take 150 mg by mouth in the morning.     levothyroxine (SYNTHROID) 25 MCG tablet Take 25 mcg by mouth daily before breakfast.     sertraline (ZOLOFT) 100 MG tablet Take 100 mg by mouth in the morning.     traMADol (ULTRAM) 50 MG tablet Take 50 mg by mouth every 6 (six) hours as needed (hip pain.). (Patient not taking: Reported on 09/10/2024)     No current facility-administered medications for this visit.    Maculo-papular rash noted on upper torso; and lower extremities.   PHYSICAL EXAMINATION:  Vitals:   09/10/24 1002  BP: (!) 156/78  Pulse: 69  Temp: 98 F (36.7 C)      Filed Weights   09/10/24 1002  Weight: 202 lb (91.6 kg)      Mild maculopapular rash noted in the back and also front.   Physical Exam Vitals and nursing note reviewed.  HENT:     Head: Normocephalic and atraumatic.     Mouth/Throat:     Pharynx: Oropharynx is clear.  Eyes:     Extraocular Movements: Extraocular movements intact.     Pupils: Pupils are equal, round, and reactive to light.  Cardiovascular:     Rate and Rhythm: Normal rate and regular rhythm.  Pulmonary:     Comments: Decreased breath sounds bilaterally.  Abdominal:     Palpations: Abdomen is soft.  Musculoskeletal:        General: Normal range of motion.     Cervical back: Normal range of motion.  Skin:    General: Skin is warm.  Neurological:     General: No focal deficit present.  Mental Status: She is alert and oriented to person, place, and time.  Psychiatric:        Behavior: Behavior normal.        Judgment: Judgment normal.      LABORATORY DATA:  I have reviewed the data as listed Lab Results  Component Value Date   WBC 5.5 09/10/2024   HGB 12.7 09/10/2024   HCT 40.6 09/10/2024   MCV 94.0 09/10/2024   PLT 163 09/10/2024   Recent Labs    11/15/23 1010 02/28/24 1008 09/10/24 0951  NA 141 141 142  K 4.6 4.3 4.4  CL 109 113* 108  CO2 23 19* 21*  GLUCOSE 140* 95  129*  BUN 47* 46* 37*  CREATININE 2.87* 2.53* 2.64*  CALCIUM 8.9 9.3 10.0  GFRNONAA 17* 19* 18*  PROT 6.9 6.6 6.5  ALBUMIN 4.2 4.1 4.2  AST 24 14* 18  ALT 21 11 20   ALKPHOS 129* 116 196*  BILITOT 0.6 0.7 0.3    RADIOGRAPHIC STUDIES: I have personally reviewed the radiological images as listed and agreed with the findings in the report. CT CHEST ABDOMEN PELVIS WO CONTRAST Result Date: 09/05/2024 CLINICAL DATA:  kidney cancer. Status post right nephrectomy. * Tracking Code: BO * EXAM: CT CHEST, ABDOMEN AND PELVIS WITHOUT CONTRAST TECHNIQUE: Multidetector CT imaging of the chest, abdomen and pelvis was performed following the standard protocol without IV contrast. RADIATION DOSE REDUCTION: This exam was performed according to the departmental dose-optimization program which includes automated exposure control, adjustment of the mA and/or kV according to patient size and/or use of iterative reconstruction technique. COMPARISON:  CT scan chest from 02/10/2023 and CT scan abdomen and pelvis from 02/15/2024. FINDINGS: CT CHEST FINDINGS Cardiovascular: Normal cardiac size. No pericardial effusion. No aortic aneurysm. There are coronary artery calcifications, in keeping with coronary artery disease. There are also mild peripheral atherosclerotic vascular calcifications of thoracic aorta and its major branches. Mediastinum/Nodes: Visualized thyroid  gland appears grossly unremarkable. No solid / cystic mediastinal masses. The esophagus is nondistended precluding optimal assessment. There is mild circumferential thickening of the lower thoracic esophagus, which is most likely seen in the settings of chronic gastroesophageal reflux disease versus esophagitis. No axillary, mediastinal or hilar lymphadenopathy by size criteria. Lungs/Pleura: The central tracheo-bronchial tree is patent. There are dependent changes in bilateral lungs. There are patchy areas of linear, plate-like atelectasis and/or scarring  throughout bilateral lungs. No mass or consolidation. No pleural effusion or pneumothorax. No suspicious lung nodules. Musculoskeletal: The visualized soft tissues of the chest wall are grossly unremarkable. No suspicious osseous lesions. There are mild to moderate multilevel degenerative changes in the visualized spine. CT ABDOMEN PELVIS FINDINGS Hepatobiliary: The liver is normal in size. Non-cirrhotic configuration. No suspicious mass. There is a stable subcapsular simple cyst in the right hepatic lobe. No intrahepatic or extrahepatic bile duct dilation. Gallbladder is surgically absent. Pancreas: Unremarkable. No pancreatic ductal dilatation or surrounding inflammatory changes. Spleen: Within normal limits. No focal lesion. Adrenals/Urinary Tract: Adrenal glands are unremarkable. Surgically absent right kidney. No local recurrent tumor noted in the right nephrectomy bed. At least mild diffuse atrophy of the left kidney. There are at least 2 simple cysts in the upper pole. No suspicious mass noted within the limitations of this unenhanced exam. No left nephroureterolithiasis or obstructive uropathy. Urinary bladder is partially distended and suboptimally evaluated. No discrete mass or perivesical fat stranding noted. Stomach/Bowel: No disproportionate dilation of the small or large bowel loops. No evidence of abnormal bowel wall thickening  or inflammatory changes. The appendix is unremarkable. There are multiple diverticula throughout the colon, without imaging signs of diverticulitis. Vascular/Lymphatic: No ascites or pneumoperitoneum. No abdominal or pelvic lymphadenopathy, by size criteria. No aneurysmal dilation of the major abdominal arteries. There are mild peripheral atherosclerotic vascular calcifications of the aorta and its major branches. Reproductive: The uterus is surgically absent. No large adnexal mass. Other: There are small fat containing umbilical and right inguinal hernias. The soft tissues and  abdominal wall are otherwise unremarkable. Musculoskeletal: No suspicious osseous lesions. There are moderate multilevel degenerative changes in the visualized spine. IMPRESSION: 1. No metastatic disease identified within the chest, abdomen or pelvis. 2. Multiple other nonacute observations, as described above. Aortic Atherosclerosis (ICD10-I70.0). Electronically Signed   By: Ree Molt M.D.   On: 09/05/2024 14:07    Clear cell carcinoma of kidney, right (HCC) #Stage IIIa-clear-cell renal carcinoma-s/p  adjuvant [name]  x 1 year [APRIL 30th, 2024].. #  DEC  2025- CT non- contrast. Right nephrectomy with no evidence for malignancy within the abdomen or pelvis  Will plan surveillance imaging every 6 months also.stable.   # Mild thrombocytopenia > 100- monitor for now. stable.   # Skin rash/pruritus -likely secondary to [name] -- improved- OFF duxpient ]Dr.Kowalski]- stable.   # Intermittently- elevated calcium- 10.4; STOP vit D 50k; today calcium is 10.0.  Vit D levels- 69 [SEP 2023]- stable.   # Anemia: Hemoglobin around 11-12  overall stable/improving; on PO iron every other day. NOV 2023- Iron sat-35%; ferretin- 330-    #CKD stage IV- GFR ~18; Continue enough hydration. Monitor closely. [s/p Dr.Korrapati Nephrology evaluation next week. 2023]- unable to refill- Fraxiga- overall stable.  FEB 205- [facility]-cards: NORMAL LEFT VENTRICULAR SYSTOLIC FUNCTION WITH MODERATE LVH - ESTIMATED EF: >55%; stable.     #Diabetes: patient OFF metformin;/glimepiride [as per nephrology]-  stable.   # Elevated BP/Poorly controlled- recommend follow up with PCP, Dr.hande.   # IV ACCESS: PIV.    # DISPOSITION: # follow up in MD 6 months-- labs-cbc/cmp; iron studies; ferritin; LDH; vit D25 [place]; CT CAP- non-contrast- Dr.B   All questions were answered. The patient knows to call the clinic with any problems, questions or concerns.   Cindy JONELLE Joe, MD 09/10/2024 11:01 AM

## 2024-09-10 NOTE — Assessment & Plan Note (Signed)
#  Stage IIIa-clear-cell renal carcinoma-s/p  adjuvant [name]  x 1 year [APRIL 30th, 2024].. #  DEC  2025- CT non- contrast. Right nephrectomy with no evidence for malignancy within the abdomen or pelvis  Will plan surveillance imaging every 6 months also.stable.   # Mild thrombocytopenia > 100- monitor for now. stable.   # Skin rash/pruritus -likely secondary to [name] -- improved- OFF duxpient ]Dr.Kowalski]- stable.   # Intermittently- elevated calcium- 10.4; STOP vit D 50k; today calcium is 10.0.  Vit D levels- 69 [SEP 2023]- stable.   # Anemia: Hemoglobin around 11-12  overall stable/improving; on PO iron every other day. NOV 2023- Iron sat-35%; ferretin- 330-    #CKD stage IV- GFR ~18; Continue enough hydration. Monitor closely. [s/p Dr.Korrapati Nephrology evaluation next week. 2023]- unable to refill- Fraxiga- overall stable.  FEB 205- [facility]-cards: NORMAL LEFT VENTRICULAR SYSTOLIC FUNCTION WITH MODERATE LVH - ESTIMATED EF: >55%; stable.     #Diabetes: patient OFF metformin;/glimepiride [as per nephrology]-  stable.   # Elevated BP/Poorly controlled- recommend follow up with PCP, Dr.hande.   # IV ACCESS: PIV.    # DISPOSITION: # follow up in MD 6 months-- labs-cbc/cmp; iron studies; ferritin; LDH; vit D25 [place]; CT CAP- non-contrast- Dr.B

## 2024-09-10 NOTE — Patient Instructions (Signed)
 Your procedure is scheduled on: TUESDAY 09/18/24 Report to the Registration Desk on the 1st floor of the [facility]. To find out your arrival time, please call [phone] between 1PM - 3PM on: MONDAY 09/17/24 If your arrival time is 6:00 am, do not arrive before that time as the [facility] entrance doors do not open until 6:00 am.  REMEMBER: Instructions that are not followed completely may result in serious medical risk, up to and including death; or upon the discretion of your surgeon and anesthesiologist your surgery may need to be rescheduled.  Do not eat food after midnight the night before surgery.  No gum chewing or hard candies.  You may however, drink CLEAR liquids up to 2 hours before you are scheduled to arrive for your surgery. Do not drink anything within 2 hours of your scheduled arrival time.  Clear liquids include: - water   - apple juice without pulp - gatorade (not RED colors) - black coffee or tea (Do NOT add milk or creamers to the coffee or tea) Do NOT drink anything that is not on this list.  In addition, your doctor has ordered for you to drink the provided:  Gatorade G2 Drinking this carbohydrate drink up to two hours before surgery helps to reduce insulin  resistance and improve patient outcomes. Please complete drinking 2 hours before scheduled arrival time.  One week prior to surgery: Stop Anti-inflammatories (NSAIDS) such as Advil, Aleve, Ibuprofen, Motrin, Naproxen, Naprosyn and Aspirin based products such as Excedrin, Goody's Powder, BC Powder. Stop ANY OVER THE COUNTER supplements until after surgery.  STOP dapagliflozin propanediol (FARXIGA) 3 days before surgery. Last dose 09/14/24.   You may however, continue to take Tylenol  if needed for pain up until the day of surgery.  Continue taking all of your other prescription medications up until the day of surgery.  ON THE DAY OF SURGERY ONLY TAKE THESE MEDICATIONS WITH SIPS OF WATER :  amLODipine  (NORVASC)  gabapentin  (NEURONTIN )  levothyroxine (SYNTHROID)  sertraline (ZOLOFT)   Use inhalers on the day of surgery and bring to the hospital.  No Alcohol for 24 hours before or after surgery.  No Smoking including e-cigarettes for 24 hours before surgery.  No chewable tobacco products for at least 6 hours before surgery.  No nicotine patches on the day of surgery.  Do not use any recreational drugs for at least a week (preferably 2 weeks) before your surgery.  Please be advised that the combination of cocaine and anesthesia may have negative outcomes, up to and including death. If you test positive for cocaine, your surgery will be cancelled.  On the morning of surgery brush your teeth with toothpaste and water , you may rinse your mouth with mouthwash if you wish. Do not swallow any toothpaste or mouthwash.  Use CHG Soap or wipes as directed on instruction sheet.  Do not wear jewelry, make-up, hairpins, clips or nail polish.  For welded (permanent) jewelry: bracelets, anklets, waist bands, etc.  Please have this removed prior to surgery.  If it is not removed, there is a chance that hospital personnel will need to cut it off on the day of surgery.  Do not wear lotions, powders, or perfumes.   Do not shave body hair from the neck down 48 hours before surgery.  Contact lenses, hearing aids and dentures may not be worn into surgery.  Do not bring valuables to the hospital. [facility] is not responsible for any missing/lost belongings or valuables.   Bring  your C-PAP to the hospital in case you may have to spend the night.   Notify your doctor if there is any change in your medical condition (cold, fever, infection).  Wear comfortable clothing (specific to your surgery type) to the hospital.  After surgery, you can help prevent lung complications by doing breathing exercises.  Take deep breaths and cough every 1-2 hours. Your doctor may order a device called an Incentive  Spirometer to help you take deep breaths. When coughing or sneezing, hold a pillow firmly against your incision with both hands. This is called splinting. Doing this helps protect your incision. It also decreases belly discomfort.  If you are being admitted to the hospital overnight, leave your suitcase in the car. After surgery it may be brought to your room.  In case of increased patient census, it may be necessary for you, the patient, to continue your postoperative care in the Same Day Surgery department.  If you are being discharged the day of surgery, you will not be allowed to drive home. You will need a responsible individual to drive you home and stay with you for 24 hours after surgery.   If you are taking public transportation, you will need to have a responsible individual with you.  Please call the Pre-admissions Testing Dept. at [phone] if you have any questions about these instructions.  Surgery Visitation Policy:  Patients having surgery or a procedure may have two visitors.  Children under the age of 7 must have an adult with them who is not the patient.  Inpatient Visitation:    Visiting hours are 7 a.m. to 8 p.m. Up to four visitors are allowed at one time in a patient room. The visitors may rotate out with other people during the day.  One visitor age 15 or older may stay with the patient overnight and must be in the room by 8 p.m.  Merchandiser, Retail to address health-related social needs:  [email or web address]    Pre-operative 4 CHG Bath Instructions   You can play a key role in reducing the risk of infection after surgery. Your skin needs to be as free of germs as possible. You can reduce the number of germs on your skin by washing with CHG (chlorhexidine  gluconate) soap before surgery. CHG is an antiseptic soap that kills germs and continues to kill germs even after washing.   DO NOT use if you have an allergy to  chlorhexidine /CHG or antibacterial soaps. If your skin becomes reddened or irritated, stop using the CHG and notify one of our RNs at [phone].   Please shower with the CHG soap starting 4 days before surgery using the following schedule:     Please keep in mind the following:  DO NOT shave, including legs and underarms, starting the day of your first shower.   You may shave your face at any point before/day of surgery.  Place clean sheets on your bed the day you start using CHG soap. Use a clean washcloth (not used since being washed) for each shower. DO NOT sleep with pets once you start using the CHG.   CHG Shower Instructions:  If you choose to wash your hair and private area, wash first with your normal shampoo/soap.  After you use shampoo/soap, rinse your hair and body thoroughly to remove shampoo/soap residue.  Turn the water  OFF and apply about 3 tablespoons (45 ml) of CHG soap to a CLEAN washcloth.  Apply CHG soap ONLY FROM YOUR NECK  DOWN TO YOUR TOES (washing for 3-5 minutes)  DO NOT use CHG soap on face, private areas, open wounds, or sores.  Pay special attention to the area where your surgery is being performed.  If you are having back surgery, having someone wash your back for you may be helpful. Wait 2 minutes after CHG soap is applied, then you may rinse off the CHG soap.  Pat dry with a clean towel  Put on clean clothes/pajamas   If you choose to wear lotion, please use ONLY the CHG-compatible lotions on the back of this paper.     Additional instructions for the day of surgery: DO NOT APPLY any lotions, deodorants, cologne, or perfumes.   Put on clean/comfortable clothes.  Brush your teeth.  Ask your nurse before applying any prescription medications to the skin.      CHG Compatible Lotions   Aveeno Moisturizing lotion  Cetaphil Moisturizing Cream  Cetaphil Moisturizing Lotion  Clairol Herbal Essence Moisturizing Lotion, Dry Skin  Clairol Herbal Essence  Moisturizing Lotion, Extra Dry Skin  Clairol Herbal Essence Moisturizing Lotion, Normal Skin  Curel Age Defying Therapeutic Moisturizing Lotion with Alpha Hydroxy  Curel Extreme Care Body Lotion  Curel Soothing Hands Moisturizing Hand Lotion  Curel Therapeutic Moisturizing Cream, Fragrance-Free  Curel Therapeutic Moisturizing Lotion, Fragrance-Free  Curel Therapeutic Moisturizing Lotion, Original Formula  Eucerin Daily Replenishing Lotion  Eucerin Dry Skin Therapy Plus Alpha Hydroxy Crme  Eucerin Dry Skin Therapy Plus Alpha Hydroxy Lotion  Eucerin Original Crme  Eucerin Original Lotion  Eucerin Plus Crme Eucerin Plus Lotion  Eucerin TriLipid Replenishing Lotion  Keri Anti-Bacterial Hand Lotion  Keri Deep Conditioning Original Lotion Dry Skin Formula Softly Scented  Keri Deep Conditioning Original Lotion, Fragrance Free Sensitive Skin Formula  Keri Lotion Fast Absorbing Fragrance Free Sensitive Skin Formula  Keri Lotion Fast Absorbing Softly Scented Dry Skin Formula  Keri Original Lotion  Keri Skin Renewal Lotion Keri Silky Smooth Lotion  Keri Silky Smooth Sensitive Skin Lotion  Nivea Body Creamy Conditioning Oil  Nivea Body Extra Enriched Lotion  Nivea Body Original Lotion  Nivea Body Sheer Moisturizing Lotion Nivea Crme  Nivea Skin Firming Lotion  NutraDerm 30 Skin Lotion  NutraDerm Skin Lotion  NutraDerm Therapeutic Skin Cream  NutraDerm Therapeutic Skin Lotion  ProShield Protective Hand Cream  Provon moisturizing lotion    Preoperative Educational Videos for Total Hip, Knee and Shoulder Replacements  To better prepare for surgery, please view our videos that explain the physical activity and discharge planning required to have the best surgical recovery at [facility].  [email or web address]  Questions? Call [phone] or email [email or web address]      How to Use an  Incentive Spirometer  An incentive spirometer is a tool that measures how well you are filling your lungs with each breath. Learning to take long, deep breaths using this tool can help you keep your lungs clear and active. This may help to reverse or lessen your chance of developing breathing (pulmonary) problems, especially infection. You may be asked to use a spirometer: After a surgery. If you have a lung problem or a history of smoking. After a long period of time when you have been unable to move or be active. If the spirometer includes an indicator to show the highest number that you have reached, your health care provider or respiratory therapist will help you set a goal. Keep a log of your progress as told by your health care provider. What are the risks? Breathing  too quickly may cause dizziness or cause you to pass out. Take your time so you do not get dizzy or light-headed. If you are in pain, you may need to take pain medicine before doing incentive spirometry. It is harder to take a deep breath if you are having pain. How to use your incentive spirometer  Sit up on the edge of your bed or on a chair. Hold the incentive spirometer so that it is in an upright position. Before you use the spirometer, breathe out normally. Place the mouthpiece in your mouth. Make sure your lips are closed tightly around it. Breathe in slowly and as deeply as you can through your mouth, causing the piston or the ball to rise toward the top of the chamber. Hold your breath for 3-5 seconds, or for as long as possible. If the spirometer includes a coach indicator, use this to guide you in breathing. Slow down your breathing if the indicator goes above the marked areas. Remove the mouthpiece from your mouth and breathe out normally. The piston or ball will return to the bottom of the chamber. Rest for a few seconds, then repeat the steps 10 or more times. Take your time and take a few normal breaths between  deep breaths so that you do not get dizzy or light-headed. Do this every 1-2 hours when you are awake. If the spirometer includes a goal marker to show the highest number you have reached (best effort), use this as a goal to work toward during each repetition. After each set of 10 deep breaths, cough a few times. This will help to make sure that your lungs are clear. If you have an incision on your chest or abdomen from surgery, place a pillow or a rolled-up towel firmly against the incision when you cough. This can help to reduce pain while taking deep breaths and coughing. General tips When you are able to get out of bed: Walk around often. Continue to take deep breaths and cough in order to clear your lungs. Keep using the incentive spirometer until your health care provider says it is okay to stop using it. If you have been in the hospital, you may be told to keep using the spirometer at home. Contact a health care provider if: You are having difficulty using the spirometer. You have trouble using the spirometer as often as instructed. Your pain medicine is not giving enough relief for you to use the spirometer as told. You have a fever. Get help right away if: You develop shortness of breath. You develop a cough with bloody mucus from the lungs. You have fluid or blood coming from an incision site after you cough. Summary An incentive spirometer is a tool that can help you learn to take long, deep breaths to keep your lungs clear and active. You may be asked to use a spirometer after a surgery, if you have a lung problem or a history of smoking, or if you have been inactive for a long period of time. Use your incentive spirometer as instructed every 1-2 hours while you are awake. If you have an incision on your chest or abdomen, place a pillow or a rolled-up towel firmly against your incision when you cough. This will help to reduce pain. Get help right away if you have shortness of  breath, you cough up bloody mucus, or blood comes from your incision when you cough. This information is not intended to replace advice given to you by  your health care provider. Make sure you discuss any questions you have with your health care provider. Document Revised: 07/08/2023 Document Reviewed: 07/08/2023 Elsevier Patient Education  2024 [employer].

## 2024-09-10 NOTE — Telephone Encounter (Signed)
 Called pt to sched CT - pt confirmed date/time/location - pt requested appt reminder via mail - LH

## 2024-09-11 ENCOUNTER — Other Ambulatory Visit: Payer: Self-pay

## 2024-09-11 ENCOUNTER — Ambulatory Visit: Payer: Self-pay | Admitting: Internal Medicine

## 2024-09-14 MED ORDER — ERGOCALCIFEROL 1.25 MG (50000 UT) PO CAPS: 50000.0000 [IU] | ORAL_CAPSULE | ORAL | 1 refills | Status: AC

## 2024-09-17 NOTE — Progress Notes (Signed)
 Cardiac clearance on chart and in Care Everywhere from Dr Wilburn on 09-12-24-Intermediate Risk

## 2024-09-17 NOTE — Anesthesia Preprocedure Evaluation (Addendum)
 "                                  Anesthesia Evaluation  Patient identified by MRN, date of birth, ID band Patient awake    Reviewed: Allergy & Precautions, H&P , NPO status , Patient's Chart, lab work & pertinent test results  Airway Mallampati: II  TM Distance: >3 FB Neck ROM: full    Dental no notable dental hx. (+) Chipped   Pulmonary neg pulmonary ROS   Pulmonary exam normal        Cardiovascular hypertension, + CAD and + DOE  Normal cardiovascular exam  Echocardiogram 10/2023 with normal biventricular systolic function, moderate MR/TR, moderate pulm hypertension.  Stress test 2022 with no ischemia  moderate mitral/tricuspid regurgitation, pulmonary hypertension   Neuro/Psych    Depression    negative neurological ROS     GI/Hepatic negative GI ROS, Neg liver ROS,,,  Endo/Other  diabetes, Type 2Hypothyroidism    Renal/GU CRFRenal diseaseS/p nephrectomy     Musculoskeletal   Abdominal  (+) + obese  Peds  Hematology negative hematology ROS (+)   Anesthesia Other Findings Past Medical History: No date: Aortic atherosclerosis No date: Arthritis No date: B12 deficiency No date: Bradycardia No date: CAD (coronary artery disease) No date: Carotid atherosclerosis No date: CKD (chronic kidney disease), stage III (HCC) 09/03/2021: Diastolic dysfunction     Comment:  a.)  TTE 09/03/2021: EF 63%; no RWMAs; LA mildly               enlarged; trivial TR/PR, mild AR, moderate MR; G1DD. No date: DOE (dyspnea on exertion) No date: Fatigue No date: Gout No date: HLD (hyperlipidemia) No date: Hypertension No date: IDA (iron deficiency anemia) No date: Iliotibial band syndrome, left leg No date: MDD (major depressive disorder) No date: OAB (overactive bladder) No date: Obesity No date: PAC (premature atrial contraction)     Comment:  noted on Holter No date: PONV (postoperative nausea and vomiting)     Comment:  a.) single episode in 03/2013 No date:  PVC (premature ventricular contraction)     Comment:  noted on Holter 10/27/2021: Right kidney mass     Comment:  a.) CT 10/27/2021 -- mass measuring 8.3 x 6.8 x 8.1 cm No date: Sensorineural hearing loss (SNHL) of both ears No date: SUI (stress urinary incontinence, female) No date: SVT (supraventricular tachycardia)     Comment:  noted on Holter No date: T2DM (type 2 diabetes mellitus) (HCC) No date: Uterovaginal prolapse No date: Vitamin D  deficiency No date: Voiding dysfunction  Past Surgical History: No date: ABDOMINAL HYSTERECTOMY     Comment:  partial No date: BLADDER SUSPENSION No date: CHOLECYSTECTOMY 07/25/2017: COLONOSCOPY; N/A     Comment:  Procedure: COLONOSCOPY;  Surgeon: Gaylyn Gladis PENNER,               MD;  Location: [facility] ENDOSCOPY;  Service: Endoscopy;                Laterality: N/A; 10/19/2021: COLONOSCOPY WITH PROPOFOL ; N/A     Comment:  Procedure: COLONOSCOPY WITH PROPOFOL ;  Surgeon:               Maryruth Ole DASEN, MD;  Location: [facility] ENDOSCOPY;                Service: Endoscopy;  Laterality: N/A;  DM No date: CYSTOURETHROSCOPY 10/19/2021: ESOPHAGOGASTRODUODENOSCOPY (EGD) WITH PROPOFOL ; N/A  Comment:  Procedure: ESOPHAGOGASTRODUODENOSCOPY (EGD) WITH               PROPOFOL ;  Surgeon: Maryruth Ole DASEN, MD;  Location:               [facility] ENDOSCOPY;  Service: Endoscopy;  Laterality: N/A; No date: JOINT REPLACEMENT; Bilateral     Comment:  PARTIAL KNEE REPLACEMENT No date: KNEE SURGERY 11/20/2021: LAPAROSCOPIC NEPHRECTOMY, HAND ASSISTED; Right     Comment:  Procedure: HAND ASSISTED LAPAROSCOPIC RADICAL               NEPHRECTOMY;  Surgeon: Francisca Redell BROCKS, MD;  Location:               [facility] ORS;  Service: Urology;  Laterality: Right; No date: PARATHYROIDECTOMY  BMI    Body Mass Index: 38.73 kg/m      Reproductive/Obstetrics negative OB ROS                              Anesthesia Physical Anesthesia Plan  ASA:  3  Anesthesia Plan: Spinal   Post-op Pain Management: Regional block* and Ofirmev  IV (intra-op)*   Induction: Intravenous  PONV Risk Score and Plan: Propofol  infusion  Airway Management Planned: Natural Airway  Additional Equipment:   Intra-op Plan:   Post-operative Plan:   Informed Consent: I have reviewed the patients History and Physical, chart, labs and discussed the procedure including the risks, benefits and alternatives for the proposed anesthesia with the patient or authorized representative who has indicated his/her understanding and acceptance.     Dental Advisory Given  Plan Discussed with: CRNA and Surgeon  Anesthesia Plan Comments:          Anesthesia Quick Evaluation  "

## 2024-09-18 ENCOUNTER — Ambulatory Visit: Payer: Self-pay | Admitting: Urgent Care

## 2024-09-18 ENCOUNTER — Ambulatory Visit: Admitting: Anesthesiology

## 2024-09-18 ENCOUNTER — Encounter: Admission: RE | Disposition: A | Payer: Self-pay | Source: Home / Self Care | Attending: Surgery

## 2024-09-18 ENCOUNTER — Ambulatory Visit
Admission: RE | Admit: 2024-09-18 | Discharge: 2024-09-19 | Disposition: A | Discharge status: Home / Self Care | Attending: Surgery | Admitting: Surgery

## 2024-09-18 ENCOUNTER — Other Ambulatory Visit: Payer: Self-pay

## 2024-09-18 ENCOUNTER — Encounter: Payer: Self-pay | Admitting: Surgery

## 2024-09-18 ENCOUNTER — Ambulatory Visit

## 2024-09-18 DIAGNOSIS — Z7984 Long term (current) use of oral hypoglycemic drugs: Secondary | ICD-10-CM | POA: Diagnosis not present

## 2024-09-18 DIAGNOSIS — I2510 Atherosclerotic heart disease of native coronary artery without angina pectoris: Secondary | ICD-10-CM | POA: Insufficient documentation

## 2024-09-18 DIAGNOSIS — Z85528 Personal history of other malignant neoplasm of kidney: Secondary | ICD-10-CM | POA: Diagnosis not present

## 2024-09-18 DIAGNOSIS — F32A Depression, unspecified: Secondary | ICD-10-CM | POA: Insufficient documentation

## 2024-09-18 DIAGNOSIS — E1122 Type 2 diabetes mellitus with diabetic chronic kidney disease: Secondary | ICD-10-CM | POA: Diagnosis not present

## 2024-09-18 DIAGNOSIS — Z01818 Encounter for other preprocedural examination: Secondary | ICD-10-CM

## 2024-09-18 DIAGNOSIS — E039 Hypothyroidism, unspecified: Secondary | ICD-10-CM | POA: Insufficient documentation

## 2024-09-18 DIAGNOSIS — N183 Chronic kidney disease, stage 3 unspecified: Secondary | ICD-10-CM | POA: Insufficient documentation

## 2024-09-18 DIAGNOSIS — Z905 Acquired absence of kidney: Secondary | ICD-10-CM | POA: Diagnosis not present

## 2024-09-18 DIAGNOSIS — Z96642 Presence of left artificial hip joint: Secondary | ICD-10-CM

## 2024-09-18 DIAGNOSIS — R531 Weakness: Secondary | ICD-10-CM | POA: Insufficient documentation

## 2024-09-18 DIAGNOSIS — M1612 Unilateral primary osteoarthritis, left hip: Secondary | ICD-10-CM | POA: Diagnosis present

## 2024-09-18 DIAGNOSIS — I129 Hypertensive chronic kidney disease with stage 1 through stage 4 chronic kidney disease, or unspecified chronic kidney disease: Secondary | ICD-10-CM | POA: Diagnosis not present

## 2024-09-18 HISTORY — PX: TOTAL HIP ARTHROPLASTY: SHX124

## 2024-09-18 LAB — GLUCOSE, CAPILLARY
Glucose-Capillary: 96 mg/dL (ref 70–99)
Glucose-Capillary: 150 mg/dL — ABNORMAL HIGH (ref 70–99)

## 2024-09-18 SURGERY — ARTHROPLASTY, HIP, TOTAL,POSTERIOR APPROACH
Anesthesia: Spinal | Site: Hip | Laterality: Left

## 2024-09-18 MED ORDER — TRANEXAMIC ACID-NACL 1000-0.7 MG/100ML-% IV SOLN
INTRAVENOUS | Status: DC | PRN
  Administered 2024-09-18: 1000 mg via INTRAVENOUS

## 2024-09-18 MED ORDER — OXYCODONE HCL 5 MG PO TABS
ORAL_TABLET | ORAL | Status: AC
  Filled 2024-09-18: qty 1

## 2024-09-18 MED ORDER — MAGNESIUM HYDROXIDE 400 MG/5ML PO SUSP: 30.0000 mL | Freq: Every day | ORAL | Status: DC | PRN

## 2024-09-18 MED ORDER — SODIUM CHLORIDE 0.9 % IV SOLN: INTRAVENOUS | Status: DC

## 2024-09-18 MED ORDER — OXYCODONE HCL 5 MG PO TABS: 5.0000 mg | ORAL_TABLET | ORAL | 0 refills | Status: AC | PRN

## 2024-09-18 MED ORDER — ONDANSETRON HCL 4 MG/2ML IJ SOLN: 4.0000 mg | Freq: Four times a day (QID) | INTRAMUSCULAR | Status: DC | PRN

## 2024-09-18 MED ORDER — SERTRALINE HCL 50 MG PO TABS
100.0000 mg | ORAL_TABLET | Freq: Every morning | ORAL | Status: DC
  Administered 2024-09-19: 100 mg via ORAL
  Filled 2024-09-18: qty 2

## 2024-09-18 MED ORDER — APIXABAN 2.5 MG PO TABS: 2.5000 mg | ORAL_TABLET | Freq: Two times a day (BID) | ORAL | 0 refills | Status: AC

## 2024-09-18 MED ORDER — FENTANYL CITRATE (PF) 100 MCG/2ML IJ SOLN
INTRAMUSCULAR | Status: AC
  Filled 2024-09-18: qty 2

## 2024-09-18 MED ORDER — PROPOFOL 10 MG/ML IV BOLUS
INTRAVENOUS | Status: DC | PRN
  Administered 2024-09-18 (×2): 20 mg via INTRAVENOUS

## 2024-09-18 MED ORDER — BUPIVACAINE LIPOSOME 1.3 % IJ SUSP
INTRAMUSCULAR | Status: AC
  Filled 2024-09-18: qty 20

## 2024-09-18 MED ORDER — 0.9 % SODIUM CHLORIDE (POUR BTL) OPTIME
TOPICAL | Status: DC | PRN
  Administered 2024-09-18: 1000 mL

## 2024-09-18 MED ORDER — METOCLOPRAMIDE HCL 10 MG PO TABS: 5.0000 mg | ORAL_TABLET | Freq: Three times a day (TID) | ORAL | Status: DC | PRN

## 2024-09-18 MED ORDER — BUPIVACAINE-EPINEPHRINE (PF) 0.5% -1:200000 IJ SOLN
INTRAMUSCULAR | Status: DC | PRN
  Administered 2024-09-18: 30 mL

## 2024-09-18 MED ORDER — METOCLOPRAMIDE HCL 5 MG/ML IJ SOLN: 5.0000 mg | Freq: Three times a day (TID) | INTRAMUSCULAR | Status: DC | PRN

## 2024-09-18 MED ORDER — ALLOPURINOL 100 MG PO TABS
100.0000 mg | ORAL_TABLET | Freq: Every morning | ORAL | Status: DC
  Administered 2024-09-19: 100 mg via ORAL
  Filled 2024-09-18 (×3): qty 1

## 2024-09-18 MED ORDER — TRIAMCINOLONE ACETONIDE 40 MG/ML IJ SUSP
INTRAMUSCULAR | Status: DC | PRN
  Administered 2024-09-18: 80 mg via INTRAMUSCULAR

## 2024-09-18 MED ORDER — PHENYLEPHRINE HCL-NACL 20-0.9 MG/250ML-% IV SOLN
INTRAVENOUS | Status: AC
  Filled 2024-09-18: qty 250

## 2024-09-18 MED ORDER — PHENYLEPHRINE HCL (PRESSORS) 10 MG/ML IV SOLN
INTRAVENOUS | Status: AC
  Filled 2024-09-18: qty 1

## 2024-09-18 MED ORDER — PHENYLEPHRINE HCL-NACL 20-0.9 MG/250ML-% IV SOLN
INTRAVENOUS | Status: DC | PRN
  Administered 2024-09-18: 20 ug/min via INTRAVENOUS

## 2024-09-18 MED ORDER — APIXABAN 2.5 MG PO TABS
2.5000 mg | ORAL_TABLET | Freq: Two times a day (BID) | ORAL | Status: DC
  Administered 2024-09-19: 2.5 mg via ORAL
  Filled 2024-09-18: qty 1

## 2024-09-18 MED ORDER — OXYCODONE HCL 5 MG/5ML PO SOLN: 5.0000 mg | Freq: Once | ORAL | Status: AC | PRN

## 2024-09-18 MED ORDER — SODIUM CHLORIDE 0.9 % BOLUS PEDS
250.0000 mL | Freq: Once | INTRAVENOUS | Status: AC
  Administered 2024-09-18: 250 mL via INTRAVENOUS

## 2024-09-18 MED ORDER — MIDAZOLAM HCL 5 MG/5ML IJ SOLN
INTRAMUSCULAR | Status: DC | PRN
  Administered 2024-09-18: 2 mg via INTRAVENOUS

## 2024-09-18 MED ORDER — SODIUM CHLORIDE (PF) 0.9 % IJ SOLN
INTRAMUSCULAR | Status: AC
  Filled 2024-09-18: qty 40

## 2024-09-18 MED ORDER — BUPIVACAINE HCL (PF) 0.5 % IJ SOLN
INTRAMUSCULAR | Status: DC | PRN
  Administered 2024-09-18: 2.5 mL via INTRATHECAL

## 2024-09-18 MED ORDER — LIDOCAINE HCL (CARDIAC) PF 100 MG/5ML IV SOSY
PREFILLED_SYRINGE | INTRAVENOUS | Status: DC | PRN
  Administered 2024-09-18: 60 mg via INTRAVENOUS

## 2024-09-18 MED ORDER — DOCUSATE SODIUM 100 MG PO CAPS
100.0000 mg | ORAL_CAPSULE | Freq: Two times a day (BID) | ORAL | Status: DC
  Administered 2024-09-18 – 2024-09-19 (×2): 100 mg via ORAL
  Filled 2024-09-18 (×2): qty 1

## 2024-09-18 MED ORDER — ONDANSETRON HCL 4 MG/2ML IJ SOLN
INTRAMUSCULAR | Status: DC | PRN
  Administered 2024-09-18: 4 mg via INTRAVENOUS

## 2024-09-18 MED ORDER — ORAL CARE MOUTH RINSE: 15.0000 mL | Freq: Once | OROMUCOSAL | Status: AC

## 2024-09-18 MED ORDER — OXYCODONE HCL 5 MG PO TABS
5.0000 mg | ORAL_TABLET | ORAL | Status: DC | PRN
  Administered 2024-09-18 – 2024-09-19 (×3): 5 mg via ORAL
  Filled 2024-09-18 (×2): qty 1

## 2024-09-18 MED ORDER — ACETAMINOPHEN 500 MG PO TABS
1000.0000 mg | ORAL_TABLET | Freq: Four times a day (QID) | ORAL | Status: DC
  Administered 2024-09-18 – 2024-09-19 (×2): 1000 mg via ORAL
  Filled 2024-09-18 (×2): qty 2

## 2024-09-18 MED ORDER — BUPIVACAINE-EPINEPHRINE (PF) 0.5% -1:200000 IJ SOLN
INTRAMUSCULAR | Status: AC
  Filled 2024-09-18: qty 30

## 2024-09-18 MED ORDER — AMLODIPINE BESYLATE 5 MG PO TABS
5.0000 mg | ORAL_TABLET | Freq: Every morning | ORAL | Status: DC
  Administered 2024-09-19: 5 mg via ORAL

## 2024-09-18 MED ORDER — PROPOFOL 1000 MG/100ML IV EMUL
INTRAVENOUS | Status: AC
  Filled 2024-09-18: qty 100

## 2024-09-18 MED ORDER — FENTANYL CITRATE (PF) 100 MCG/2ML IJ SOLN
INTRAMUSCULAR | Status: DC | PRN
  Administered 2024-09-18 (×3): 25 ug via INTRAVENOUS

## 2024-09-18 MED ORDER — OXYCODONE HCL 5 MG PO TABS
5.0000 mg | ORAL_TABLET | Freq: Once | ORAL | Status: AC | PRN
  Administered 2024-09-18: 5 mg via ORAL

## 2024-09-18 MED ORDER — BUPIVACAINE HCL (PF) 0.5 % IJ SOLN
INTRAMUSCULAR | Status: AC
  Filled 2024-09-18: qty 10

## 2024-09-18 MED ORDER — DROPERIDOL 2.5 MG/ML IJ SOLN: 0.6250 mg | Freq: Once | INTRAMUSCULAR | Status: DC | PRN

## 2024-09-18 MED ORDER — TRAMADOL HCL 50 MG PO TABS: 50.0000 mg | ORAL_TABLET | Freq: Four times a day (QID) | ORAL | Status: DC | PRN

## 2024-09-18 MED ORDER — FENTANYL CITRATE (PF) 100 MCG/2ML IJ SOLN
25.0000 ug | INTRAMUSCULAR | Status: DC | PRN
  Administered 2024-09-18 (×2): 25 ug via INTRAVENOUS

## 2024-09-18 MED ORDER — MIDAZOLAM HCL 2 MG/2ML IJ SOLN
INTRAMUSCULAR | Status: AC
  Filled 2024-09-18: qty 2

## 2024-09-18 MED ORDER — SODIUM CHLORIDE 0.9 % IR SOLN
Status: DC | PRN
  Administered 2024-09-18: 3000 mL

## 2024-09-18 MED ORDER — PROPOFOL 500 MG/50ML IV EMUL
INTRAVENOUS | Status: DC | PRN
  Administered 2024-09-18: 100 ug/kg/min via INTRAVENOUS

## 2024-09-18 MED ORDER — BISACODYL 10 MG RE SUPP: 10.0000 mg | Freq: Every day | RECTAL | Status: DC | PRN

## 2024-09-18 MED ORDER — EPHEDRINE SULFATE-NACL 50-0.9 MG/10ML-% IV SOSY
PREFILLED_SYRINGE | INTRAVENOUS | Status: DC | PRN
  Administered 2024-09-18: 5 mg via INTRAVENOUS
  Administered 2024-09-18 (×2): 2.5 mg via INTRAVENOUS

## 2024-09-18 MED ORDER — ONDANSETRON HCL 4 MG PO TABS: 4.0000 mg | ORAL_TABLET | Freq: Four times a day (QID) | ORAL | Status: DC | PRN

## 2024-09-18 MED ORDER — DIPHENHYDRAMINE HCL 12.5 MG/5ML PO ELIX: 12.5000 mg | ORAL_SOLUTION | ORAL | Status: DC | PRN

## 2024-09-18 MED ORDER — EPHEDRINE 5 MG/ML INJ
INTRAVENOUS | Status: AC
  Filled 2024-09-18: qty 5

## 2024-09-18 MED ORDER — CEFAZOLIN SODIUM-DEXTROSE 2-4 GM/100ML-% IV SOLN
2.0000 g | Freq: Four times a day (QID) | INTRAVENOUS | Status: AC
  Administered 2024-09-18 (×2): 2 g via INTRAVENOUS
  Filled 2024-09-18: qty 100

## 2024-09-18 MED ORDER — CEFAZOLIN SODIUM-DEXTROSE 2-4 GM/100ML-% IV SOLN
INTRAVENOUS | Status: AC
  Filled 2024-09-18: qty 100

## 2024-09-18 MED ORDER — ONDANSETRON 4 MG PO TBDP: 4.0000 mg | ORAL_TABLET | Freq: Three times a day (TID) | ORAL | 1 refills | Status: AC | PRN

## 2024-09-18 MED ORDER — LEVOTHYROXINE SODIUM 25 MCG PO TABS
25.0000 ug | ORAL_TABLET | Freq: Every day | ORAL | Status: DC
  Administered 2024-09-19: 25 ug via ORAL
  Filled 2024-09-18: qty 1

## 2024-09-18 MED ORDER — TRAMADOL HCL 50 MG PO TABS: 50.0000 mg | ORAL_TABLET | Freq: Four times a day (QID) | ORAL | 0 refills | Status: AC | PRN

## 2024-09-18 MED ORDER — FERROUS SULFATE 325 (65 FE) MG PO TABS
325.0000 mg | ORAL_TABLET | ORAL | Status: DC
  Administered 2024-09-18: 325 mg via ORAL
  Filled 2024-09-18: qty 1

## 2024-09-18 MED ORDER — TRIAMCINOLONE ACETONIDE 40 MG/ML IJ SUSP
INTRAMUSCULAR | Status: AC
  Filled 2024-09-18: qty 2

## 2024-09-18 MED ORDER — FLEET ENEMA RE ENEM: 1.0000 | ENEMA | Freq: Once | RECTAL | Status: DC | PRN

## 2024-09-18 MED ORDER — ACETAMINOPHEN 10 MG/ML IV SOLN
1000.0000 mg | Freq: Once | INTRAVENOUS | Status: DC | PRN
  Administered 2024-09-18: 1000 mg via INTRAVENOUS

## 2024-09-18 MED ORDER — ACETAMINOPHEN 325 MG PO TABS: 325.0000 mg | ORAL_TABLET | Freq: Four times a day (QID) | ORAL | Status: DC | PRN

## 2024-09-18 MED ORDER — SODIUM CHLORIDE 0.9 % IV SOLN
INTRAVENOUS | Status: DC | PRN
  Administered 2024-09-18: 60 mL

## 2024-09-18 MED ORDER — CHLORHEXIDINE GLUCONATE 0.12 % MT SOLN
OROMUCOSAL | Status: AC
  Filled 2024-09-18: qty 15

## 2024-09-18 MED ORDER — GABAPENTIN 100 MG PO CAPS
100.0000 mg | ORAL_CAPSULE | Freq: Two times a day (BID) | ORAL | Status: DC
  Administered 2024-09-18 – 2024-09-19 (×2): 100 mg via ORAL
  Filled 2024-09-18 (×2): qty 1

## 2024-09-18 MED ORDER — AMLODIPINE BESYLATE 5 MG PO TABS
2.5000 mg | ORAL_TABLET | Freq: Every morning | ORAL | Status: DC
  Administered 2024-09-19: 2.5 mg via ORAL

## 2024-09-18 MED ORDER — ACETAMINOPHEN 10 MG/ML IV SOLN
INTRAVENOUS | Status: AC
  Filled 2024-09-18: qty 100

## 2024-09-18 MED ORDER — TRANEXAMIC ACID-NACL 1000-0.7 MG/100ML-% IV SOLN
INTRAVENOUS | Status: AC
  Filled 2024-09-18: qty 100

## 2024-09-18 MED ORDER — IRBESARTAN 150 MG PO TABS
150.0000 mg | ORAL_TABLET | Freq: Every morning | ORAL | Status: DC
  Administered 2024-09-19: 150 mg via ORAL
  Filled 2024-09-18: qty 1

## 2024-09-18 MED ORDER — CEFAZOLIN SODIUM-DEXTROSE 2-4 GM/100ML-% IV SOLN
2.0000 g | INTRAVENOUS | Status: AC
  Administered 2024-09-18: 2 g via INTRAVENOUS

## 2024-09-18 MED ORDER — SODIUM CHLORIDE 0.9 % IV SOLN: INTRAVENOUS | Status: AC

## 2024-09-18 MED ORDER — CHLORHEXIDINE GLUCONATE 0.12 % MT SOLN
15.0000 mL | Freq: Once | OROMUCOSAL | Status: AC
  Administered 2024-09-18: 15 mL via OROMUCOSAL

## 2024-09-18 MED ORDER — ONDANSETRON HCL 4 MG/2ML IJ SOLN
INTRAMUSCULAR | Status: AC
  Filled 2024-09-18: qty 2

## 2024-09-18 SURGICAL SUPPLY — 48 items
BIT DRILL JC 5IN 4.7M 127 34FL (BIT) IMPLANT
BLADE SAGITTAL WIDE XTHICK NO (BLADE) ×1 IMPLANT
BLADE SURG SZ20 CARB STEEL (BLADE) ×1 IMPLANT
CHLORAPREP W/TINT 26 (MISCELLANEOUS) ×1 IMPLANT
CNTNR URN SCR LID CUP LEK RST (MISCELLANEOUS) IMPLANT
DRAPE IMP U-DRAPE 54X76 (DRAPES) IMPLANT
DRAPE INCISE IOBAN 66X60 STRL (DRAPES) ×1 IMPLANT
DRAPE SHEET LG 3/4 BI-LAMINATE (DRAPES) ×1 IMPLANT
DRAPE SURG 17X11 SM STRL (DRAPES) ×2 IMPLANT
DRSG MEPILEX SACRM 8.7X9.8 (GAUZE/BANDAGES/DRESSINGS) ×1 IMPLANT
DRSG OPSITE POSTOP 4X10 (GAUZE/BANDAGES/DRESSINGS) ×1 IMPLANT
ELECT BLADE 6.5 EXT (BLADE) IMPLANT
ELECT CAUTERY BLADE 6.4 (BLADE) ×1 IMPLANT
GAUZE 4X4 16PLY ~~LOC~~+RFID DBL (SPONGE) ×1 IMPLANT
GAUZE XEROFORM 1X8 LF (GAUZE/BANDAGES/DRESSINGS) ×1 IMPLANT
GLOVE BIO SURGEON STRL SZ7.5 (GLOVE) ×4 IMPLANT
GLOVE BIO SURGEON STRL SZ8 (GLOVE) ×4 IMPLANT
GLOVE BIOGEL PI IND STRL 8 (GLOVE) ×2 IMPLANT
GOWN STRL REUS W/ TWL LRG LVL3 (GOWN DISPOSABLE) ×1 IMPLANT
GOWN STRL REUS W/ TWL XL LVL3 (GOWN DISPOSABLE) ×1 IMPLANT
HEAD CERAMIC BIOLOX 36MM (Head) IMPLANT
HOLSTER ELECTROSUGICAL PENCIL (MISCELLANEOUS) ×1 IMPLANT
HOOD PEEL AWAY T7 (MISCELLANEOUS) ×3 IMPLANT
KIT TURNOVER KIT A (KITS) ×1 IMPLANT
LINER ACE G7 36 SZ D HIGH WALL (Liner) IMPLANT
MANIFOLD NEPTUNE II (INSTRUMENTS) ×1 IMPLANT
NDL SAFETY ECLIP 18X1.5 (MISCELLANEOUS) ×2 IMPLANT
NEEDLE FILTER BLUNT 18X1 1/2 (NEEDLE) ×1 IMPLANT
NEEDLE SPNL 20GX3.5 QUINCKE YW (NEEDLE) ×1 IMPLANT
PACK HIP PROSTHESIS (MISCELLANEOUS) ×1 IMPLANT
PENCIL SMOKE EVACUATOR (MISCELLANEOUS) ×1 IMPLANT
PIN STEIN SMOOTH 3/16X9 (Pin) ×1 IMPLANT
SHELL ACETAB HIP 3H 50MM (Hips) IMPLANT
SLEEVE HIP BIOLOX -6MM OFFSET (Sleeve) IMPLANT
SOL .9 NS 3000ML IRR UROMATIC (IV SOLUTION) ×1 IMPLANT
SOLN STERILE WATER BTL 1000 ML (IV SOLUTION) ×1 IMPLANT
SPONGE T-LAP 18X18 ~~LOC~~+RFID (SPONGE) IMPLANT
STAPLER SKIN PROX 35W (STAPLE) ×1 IMPLANT
STEM COLLARLESS FULL 13X145MM (Stem) IMPLANT
SUT TICRON 2-0 30IN 311381 (SUTURE) ×3 IMPLANT
SUT VIC AB 0 CT1 36 (SUTURE) ×1 IMPLANT
SUT VIC AB 1 CT1 36 (SUTURE) ×1 IMPLANT
SUT VIC AB 2-0 CT1 (SUTURE) ×3 IMPLANT
SYR 10ML LL (SYRINGE) ×1 IMPLANT
SYR 20ML LL LF (SYRINGE) ×1 IMPLANT
SYR 30ML LL (SYRINGE) ×2 IMPLANT
TIP FAN IRRIG PULSAVAC PLUS (DISPOSABLE) ×1 IMPLANT
TRAP FLUID SMOKE EVACUATOR (MISCELLANEOUS) ×1 IMPLANT

## 2024-09-18 NOTE — TOC Initial Note (Addendum)
 Transition of Care [facility]) - Initial/Assessment Note    Patient Details  Name: Gabrielle Savage MRN: [id number] Date of Birth: 1946-05-11  Transition of [facility]) CM/SW Contact:    Victory Jackquline GORMAN, RN Phone Number: 09/18/2024, 9:51 AM  Clinical Narrative:    Patient is from home she lives with her spouse. She does not have any equipment already in the home, 3 in1 and RW ordered from [employer] to be delivered to the bedside. Home health has already been arranged with [facility] and OT by Dr. Geroldine office. Spouse will transport patient home at the time of discharge. Patient has discharge orders. No further concerns. RNCM signing off.               Expected Discharge Plan: Home w Home Health Services Barriers to Discharge: Continued Medical Work up   Patient Goals and CMS Choice            Expected Discharge Plan and Services       Living arrangements for the past 2 months: Single Family Home Expected Discharge Date: 09/18/24               DME Arranged: BOB Finder rolling DME Agency: [employer] Date DME Agency Contacted: 09/18/24   Representative spoke with at DME Agency: [facility] HH Arranged: PT HH Agency: [facility]     Representative spoke with at [facility] Agency: HH set up by Dr Poggi's office prior to admission  Prior Living Arrangements/Services Living arrangements for the past 2 months: Single Family Home Lives with:: Self, Spouse Patient language and need for interpreter reviewed:: Yes Do you feel safe going back to the place where you live?: Yes      Need for Family Participation in Patient Care: Yes (Comment) Care giver support system in place?: Yes (comment)   Criminal Activity/Legal Involvement Pertinent to Current Situation/Hospitalization: No - Comment as needed  Activities of Daily Living      Permission Sought/Granted                  Emotional Assessment Appearance:: Appears stated age, Well-Groomed Attitude/Demeanor/Rapport:  Gracious Affect (typically observed): Calm, Quiet, Pleasant Orientation: : Oriented to Self, Oriented to Place, Oriented to  Time, Oriented to Situation Alcohol / Substance Use: Not Applicable Psych Involvement: No (comment)  Admission diagnosis:  Primary osteoarthritis of left hip [M16.12] Patient Active Problem List   Diagnosis Date Noted   S/p nephrectomy 10/28/2022   Proteinuria, unspecified 02/11/2022   History of radical nephrectomy 02/11/2022   Chronic depression 02/11/2022   Anemia in chronic kidney disease 02/11/2022   Acidosis, unspecified 02/11/2022   Clear cell carcinoma of kidney, right (HCC) 12/12/2021   Diabetes mellitus without complication (HCC) 12/12/2021   Gout 12/12/2021   Cancer of kidney (HCC) 11/20/2021   Chronic superficial gastritis without bleeding 10/27/2021   Elevated ferritin 10/27/2021   Epigastric abdominal tenderness 10/27/2021   Generalized abdominal pain 10/27/2021   Hx of adenomatous polyp of colon 10/27/2021   Unintended weight loss 10/27/2021   Abnormal ECG 08/19/2021   Bilateral carotid artery stenosis 08/19/2021   Hyperlipidemia, mixed 08/19/2021   SOBOE (shortness of breath on exertion) 08/19/2021   Elevated alkaline phosphatase level 08/10/2021   Iron deficiency anemia 08/10/2021   Major depressive disorder, recurrent, in remission 01/01/2020   CKD (chronic kidney disease) stage 3, GFR 30-59 ml/min (HCC) 06/20/2017   Elevated blood sugar level 06/20/2017   History of partial knee replacement 05/12/2017   Iliotibial band syndrome  of left side 05/12/2017   Disequilibrium 02/10/2016   Sensorineural hearing loss of both ears 06/13/2015   Fatigue 05/05/2015   Encounter for fitting or adjustment of hearing aid 07/19/2014   OAB (overactive bladder) 08/23/2013   Post-operative state 08/23/2013   Voiding dysfunction 08/23/2013   SUI (stress urinary incontinence, female) 07/09/2013   Uterovaginal prolapse 07/09/2013   Chronic kidney disease  06/26/2013   Hypertension 06/26/2013   Obesity 06/26/2013   Prolapse of female pelvic organs 06/26/2013   Vitamin D  deficiency 06/26/2013   PCP:  Sadie Manna, MD Pharmacy:   [facility] [address] [place] - [address] [place] [address] Phone: [phone] Fax: [phone]     Social Drivers of Health (SDOH) Social History: SDOH Screenings   Food Insecurity: No Food Insecurity (09/04/2024)   Received from [facility]  Housing: Low Risk  (09/04/2024)   Received from [facility]  Transportation Needs: No Transportation Needs (09/04/2024)   Received from [facility]  Utilities: Not At Risk (09/04/2024)   Received from [facility]  Alcohol Screen: Low Risk (12/07/2021)  Depression (PHQ2-9): Low Risk (12/07/2021)  Financial Resource Strain: Low Risk  (09/04/2024)   Received from [facility]  Physical Activity: Insufficiently Active (12/07/2021)  Social Connections: Moderately Integrated (12/07/2021)  Stress: No Stress Concern Present (12/07/2021)  Tobacco Use: Low Risk (09/18/2024)   SDOH Interventions:     Readmission Risk Interventions     No data to display

## 2024-09-18 NOTE — H&P (Signed)
 History of Present Illness: Gabrielle Savage is a 79 y.o. female who presents today for her surgical history and physical for upcoming left total hip arthroplasty. Surgery scheduled with Dr. Edie on 09/18/2024. The patient denies any changes in her medical history since her last evaluation. She continues to report ongoing aching and throbbing left hip pain. She reports a 9 out of 10 pain score, she is taking tramadol  as needed for discomfort. She denies any personal history of heart attack, stroke, asthma or COPD. No personal history of blood clots. She is a diabetic, most recent A1c was 6.3. The patient is unable to take oral anti-inflammatories due to only having 1 kidney due to a history of renal cell carcinoma.  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  SUI (stress urinary incontinence, female) 07/09/2013  Uterovaginal prolapse 07/09/2013  Vitamin B12 deficiency  Vitamin D  deficiency  Voiding dysfunction 08/23/2013   Past Surgical History: SLING FOR STRESS INCONTINENCE N/A 07/09/2013  Procedure: VALORIE FOR STRESS INCONTINENCE; Surgeon: Dorthea Velia Reek, MD; Location: [facility] OR; Service: Gynecology; Laterality: N/A;  CYSTOURETHROSCOPY N/A 07/09/2013  Procedure: CYSTOURETHROSCOPY; Surgeon: Dorthea Velia Reek, MD; Location: [facility] OR; Service: Gynecology; Laterality: N/A;  CYSTECTOMY 10/2015  above left Eyelid  COLONOSCOPY 07/25/2017 (Tubulovillous adenoma/TA 3 YR MUS)  EGD 10/19/2021 (Gastritis/Repeat as needed/CTL)  COLONOSCOPY  10/19/2021 (Tubular adenomas/SSP/PHx CP/Repeat 60yrs/CTL)  CHOLECYSTECTOMY  COLPORRHAPHY ANTERIOR-POSTERIOR  gout (removal of uric acid in left foot)  HYSTERECTOMY (uterus removed)  JOINT REPLACEMENT (partial left and right knees)  para thyroid  removal  REPAIR ENTEROCELE VAGINAL   Past Family History: High blood pressure (Hypertension) Father  Stroke Father  Lung cancer Father  High blood pressure (Hypertension) Mother  Heart failure Mother  Diabetes type II Mother  Obesity Mother  Aneurysm Sister  Anesthesia problems Neg Hx  Malignant hyperthermia Neg Hx   Medications: ACCU-CHEK SOFTCLIX LANCETS lancets Use 1 each once daily  allopurinoL  (ZYLOPRIM ) 100 MG tablet Take 100 mg by mouth once daily  amLODIPine  (NORVASC ) 2.5 MG tablet Take 1 tablet (2.5 mg total) by mouth once daily 90 tablet 1  amLODIPine  (NORVASC ) 5 MG tablet Take 1 tablet (5 mg total) by mouth once daily 90 tablet 1  blood glucose diagnostic test strip 1 each (1 strip total) once daily Use as instructed. 100 each 3  blood glucose meter kit once daily for 180 days 1 each 0  butalbital -acetaminophen -caffeine  (FIORICET) 50-325-40 mg tablet Take 1 tablet by mouth every 6 (six) hours as needed  calcitRIOL (ROCALTROL) 0.25 MCG capsule Take 0.25 mcg by mouth every other day  cyanocobalamin (VITAMIN B12) 1000 MCG tablet Take 1 tablet (1,000 mcg total) by mouth once daily 90 tablet 1  dupilumab  (DUPIXENT ) 300 mg/2 mL inj syringe Inject 300 mg subcutaneously every 14 (fourteen) days  ferrous sulfate  325 (65 FE) MG tablet Take 325 mg by mouth every other day  gabapentin  (NEURONTIN ) 100 MG capsule Take 100 mg by mouth 2 (two) times daily  hydrALAZINE (APRESOLINE) 25 MG tablet Take 1 tablet (25 mg total) by mouth 3 (three) times daily as needed (for BP over 150/90) 90 tablet 2  hydroxychloroquine (  PLAQUENIL) 200 mg tablet Take 1 tablet (200 mg total) by mouth 2 (two) times daily 60 tablet 5  hydrOXYzine  (ATARAX ) 25 MG tablet Take  1 tablet (25 mg total) by mouth 3 (three) times daily as needed for Itching 90 tablet 5  irbesartan  (AVAPRO ) 150 MG tablet Take 1 tablet by mouth once daily 90 tablet 1  lancing device with lancets kit Use 1 each once daily 100 each 3  levothyroxine  (SYNTHROID ) 25 MCG tablet TAKE 1 TABLET BY MOUTH ONCE DAILY ON AN EMPTY STOMACH WITH A GLASS OF WATER  AT LEAST 30 TO 60 MINUTES BEFORE BREAKFAST 90 tablet 1  montelukast  (SINGULAIR ) 10 mg tablet Take 10 mg by mouth at bedtime  sertraline  (ZOLOFT ) 100 MG tablet Take 1 tablet by mouth once daily 90 tablet 1  traMADoL  (ULTRAM ) 50 mg tablet Take 1 tablet (50 mg total) by mouth every 6 (six) hours as needed for Pain for up to 30 doses 30 tablet 0  triamcinolone  0.5 % ointment Apply topically 2 (two) times daily   Allergies: NSAIDs (Interferes with pt's kidneys)  Oxycodone  Nausea  Propofol  Other (Kidney disease)   Review of Systems:  A comprehensive 14 point ROS was performed, reviewed by me today, and the pertinent orthopaedic findings are documented in the HPI.  Physical Exam: BP (!) 140/64  Ht 157.5 cm (5' 2)  Wt 96.3 kg (212 lb 3.2 oz)  LMP (LMP Unknown)  BMI 38.81 kg/m  General/Constitutional: The patient appears to be well-nourished, well-developed, and in no acute distress. Neuro/Psych: Normal mood and affect, oriented to person, place and time. Eyes: Non-icteric. Pupils are equal, round, and reactive to light, and exhibit synchronous movement. ENT: Unremarkable. Lymphatic: No palpable adenopathy. Respiratory: Lungs clear to auscultation, Normal chest excursion, No wheezes, and Non-labored breathing Cardiovascular: Regular rate and rhythm. No murmurs. and No edema, swelling or tenderness, except as noted in detailed exam. Integumentary: No impressive skin lesions present, except as noted in detailed exam. Musculoskeletal: Unremarkable, except as noted in detailed exam.  General: Well developed, well nourished 79 y.o. female in no  apparent distress. Normal affect. Normal communication. Patient answers questions appropriately. The patient has a a moderate limp favoring the left leg more so than the right.  Lumbar Spine: Examination of the lumbar spine reveals no bony abnormality, no edema, and no ecchymosis. There is no step off. The patient has full range of motion of the lumbar spine with flexion and extension. The patient has normal lateral bend and rotation. The patient has no pain with range of motion activities. The patient is non tender along the spinous process. The patient is non tender along the paravertebral muscles, with no muscle spasms. The patient is non tender along the iliac crest. The patient is non tender in the sciatic notch. The patient is non tender along the Sacroiliac joint. There is no Coccyx joint tenderness.   Left Lower Extremity: Examination of the left lower extremities reveals no bony abnormality, no edema, and no ecchymosis. Skin examination of both the left and right knee demonstrate well-healed previous knee incision sites. The patient does not report any significant pain with palpation along the medial or lateral joint line of the the left knee. She has full extension of the left knee is able to flex close to 100 degrees without significant pain. The patient has limited internal and external rotation of the left hip with increased discomfort. She does have moderate pain with palpation of the left greater trochanteric bursa in addition  to anterior groin pain with range of motion activities. The patient has a positive axial load test at the hip joint. Increased pain with passive left hip flexion and extension. The patient has a negative Patrick's test. The patient is tender along the greater trochanter region. The patient has a negative Toula' test bilaterally. There is normal skin warmth. There is normal capillary refill bilaterally.   Neurologic: The patient has a negative straight leg raise. The  patient has normal muscle strength testing for the quadriceps, calves, ankle dorsiflexion, ankle plantarflexion, and extensor hallicus longus. The patient has sensation that is intact to light touch. The deep tendon reflexes are normal at the patella and achilles. No clonus is noted.   Imaging: AP and lateral views of the left hip were obtained today in the office and reviewed by me. These x-rays demonstrate significant left hip osteoarthritic changes with some mild protrusio of the hip. Significant loss of the inferior acetabular joint space. Significant osteophyte formation of the superior and inferior aspect of the femoral head neck. No evidence of dislocation. No evidence of avascular porosis. No acute fractures identified.  Impression: Primary osteoarthritis of left hip.  Plan:  1. Treatment options were discussed today with the patient. 2. The patient is scheduled for a left total hip arthroplasty with Dr. Edie on 09/18/2024. 3. The patient was instructed on the risk and benefits of a left total hip arthroplasty at today's appointment. The patient would like to proceed with surgery at this time. 4. This document will serve as a surgical history and physical for the patient. 5. The patient will follow-up per standard postop protocol. They can call the clinic they have any questions, new symptoms develop or symptoms worsen.  The procedure was discussed with the patient, as were the potential risks (including bleeding, infection, nerve and/or blood vessel injury, persistent or recurrent pain, failure of the hardware, dislocation, leg length inequality, need for further surgery, blood clots, strokes, heart attacks and/or arhythmias, pneumonia, etc.) and benefits. The patient states her understanding and wishes to proceed.

## 2024-09-18 NOTE — Op Note (Signed)
 09/18/2024  9:45 AM  Patient:   Gabrielle Savage  Pre-Op Diagnosis:   Degenerative joint disease, left hip.  Post-Op Diagnosis:   Same.  Procedure:   Left total hip arthroplasty.  Surgeon:   DOROTHA Reyes Maltos, MD  Assistant:   Gustavo Level, PA-C  Anesthesia:   Spinal  Findings:   As above.  Complications:   None  EBL:   150 cc  Fluids:   500 cc crystalloid  UOP:   None  TT:   None  Drains:   None  Closure:   Staples  Implants:   Biomet press-fit system with a #13 laterally offset Echo femoral stem, a 50 mm acetabular shell with an E-poly hi-wall liner, and a 36 mm ceramic head with a -6 mm neck adapter.  Brief Clinical Note:   The patient is a 79 year old female with a long history of gradually worsening left hip/groin pain. Her symptoms have progressed despite medications, activity modification, etc. Her history and examination consistent with advanced degenerative joint disease of the hip, confirmed by plain radiographs. The patient presents at this time for a left total hip arthroplasty.   Procedure:   The patient was brought into the operating room. After adequate spinal anesthesia was obtained, the patient was repositioned in the right lateral decubitus position and secured using a lateral hip positioner. The left hip and lower extremity were prepped with ChloroPrep solution before being draped sterilely. Preoperative antibiotics were administered. A timeout was performed to verify the appropriate surgical site.    A standard posterior approach to the hip was made through an approximately 6-7 inch incision. The incision was carried down through the subcutaneous tissues to expose the gluteal fascia and proximal end of the iliotibial band. These structures were split the length of the incision and the Charnley self-retaining hip retractor placed. The bursal tissues were swept posteriorly to expose the short external rotators. The anterior border of the piriformis tendon was  identified and this plane developed down through the capsule to enter the joint. A flap of tissue was elevated off the posterior aspect of the femoral neck and greater trochanter and retracted posteriorly. This flap included the piriformis tendon, the short external rotators, and the posterior capsule. The soft tissues were elevated off the lateral aspect of the ilium and a large Steinmann pin placed bicortically.   With the left leg aligned over the right, a drill bit was placed into the greater trochanter parallel to the Steinmann pin and the distance between these two pins measured in order to optimize leg lengths postoperatively. The drill bit was removed and the hip dislocated. The piriformis fossa was debrided of soft tissues before the intramedullary canal was accessed through this point using a triple step reamer. The canal was reamed sequentially beginning with a #7 tapered reamer and progressing to a #13 tapered reamer. This provided excellent circumferential chatter. Using the appropriate guide, a femoral neck cut was made 10-12 mm above the lesser trochanter. The femoral head was removed.  Attention was directed to the acetabular side. The labrum was debrided circumferentially before the ligamentum teres was removed using a large curette. A line was drawn on the drapes corresponding to the native version of the acetabulum. This line was used as a guide while the acetabulum was reamed sequentially beginning with a 45 mm reamer and progressing to a 49 mm reamer. This provided excellent circumferential chatter. The 49 mm trial acetabulum was positioned and found to fit quite well. Therefore,  the 50 mm acetabular shell was selected and impacted into place with care taken to maintain the appropriate version. The trial high wall liner was inserted.  Attention was redirected to the femoral side. A box osteotome was used to establish version before the canal was broached sequentially beginning with a #10  broach and progressing to a #13 broach. This was left in place and several trial reductions performed using both the standard and laterally offset neck options, as well as the -3 mm and -6 mm neck lengths. After removing the trial components, the manhole cover was placed into the apex of the acetabular shell and tightened securely. The permanent E-polyethylene hi-wall liner was impacted into the acetabular shell and its locking mechanism verified using a quarter-inch osteotome. Next, the #13 laterally offset femoral stem was impacted into place with care taken to maintain the appropriate version. A repeat trial reduction was performed using the -3 mm and -6 mm neck lengths. The -6 mm neck length demonstrated excellent stability both in extension and external rotation as well as with flexion to 90 and internal rotation beyond 70. It also was stable in the position of sleep. In addition, leg lengths appeared to be restored appropriately, both by reassessing the position of the right leg over the left, as well as by measuring the distance between the Steinmann pin and the drill bit. The 36 mm ceramic head with the -6 mm neck adapter was impacted onto the stem of the femoral component. The Morse taper locking mechanism was verified using manual distraction before the head was relocated and placed through a range of motion with the findings as described above.  The wound was copiously irrigated with sterile saline solution via the jet lavage system before the peri-incisional and pericapsular tissues were injected with a cocktail of 20 cc of Exparel , 30 cc of 0.5% Sensorcaine , 2 cc of Kenalog  40 (80 mg), and 30 mg of Toradol diluted out to 90 cc with normal saline to help with postoperative analgesia. The posterior flap was reapproximated to the posterior aspect of the greater trochanter using #2 Tycron interrupted sutures placed through drill holes. Several additional #2 Tycron interrupted sutures were used to  reinforce this layer of closure. The iliotibial band was reapproximated using #1 Vicryl interrupted sutures before the gluteal fascia was closed using a running #0 Vicryl suture. The subcutaneous tissues were closed in several layers using 2-0 Vicryl interrupted sutures before the skin was closed using staples. A sterile occlusive dressing was applied to the wound. The patient was then rolled back into the supine position on his/her hospital bed before being awakened and returned to the recovery room in satisfactory condition after tolerating the procedure well.

## 2024-09-18 NOTE — Transfer of Care (Signed)
 Immediate Anesthesia Transfer of Care Note  Patient: Gabrielle Savage  Procedure(s) Performed: ARTHROPLASTY, HIP, TOTAL,POSTERIOR APPROACH (Left: Hip)  Patient Location: PACU  Anesthesia Type:General  Level of Consciousness: drowsy  Airway & Oxygen Therapy: Patient Spontanous Breathing and Patient connected to face mask oxygen  Post-op Assessment: Report given to RN and Post -op Vital signs reviewed and stable  Post vital signs: Reviewed and stable  Last Vitals:  Vitals Value Taken Time  BP 136/61 09/18/24 09:39  Temp    Pulse 69 09/18/24 09:44  Resp 16 09/18/24 09:44  SpO2 99 % 09/18/24 09:44  Vitals shown include unfiled device data.  Last Pain:  Vitals:   09/18/24 0622  TempSrc: Temporal  PainSc: 6          Complications: No notable events documented.

## 2024-09-18 NOTE — Progress Notes (Signed)
 The beneficiary has a mobility limitation that significantly impairs his/her ability to participate in one or more mobility-related activities of daily living (MRADL) in the home. The patient is able to safely use the walker. The functional mobility deficit can be sufficiently resolved by use of a rolling walker.

## 2024-09-18 NOTE — Progress Notes (Signed)
 This patient is not able to walk the distance required to go the bathroom, or he/she is unable to safely negotiate stairs required to access the bathroom.  A 3-in-1 BSC will alleviate this problem.

## 2024-09-18 NOTE — Discharge Instructions (Addendum)
 Orthopedic discharge instructions: May shower with intact OpSite dressing. Apply ice frequently to hip. Start Eliquis  1 tablet (2.5 mg) twice daily on Wednesday, 09/19/2024, for 2 weeks, then take aspirin 325 mg twice daily for 4 weeks. Take pain medication as prescribed when needed.  May supplement with ES Tylenol  if necessary. May weight-bear as tolerated on left leg - use walker for balance and support. Follow-up in 10-14 days or as scheduled.

## 2024-09-18 NOTE — Evaluation (Signed)
 Physical Therapy Evaluation Patient Details Name: Gabrielle Savage MRN: [id number] DOB: 1946-02-16 Today's Date: 09/18/2024  History of Present Illness  79 y/o female s/p L posterior THA on 09/18/24. PMH: T2DM, sensorineural hearing loss of both ears, kidney disease, HTN  Clinical Impression  Patient admitted following the above procedure. PTA, patient lives with husband and was independent with occasional use of SPC. Required CGA-minA for bed mobility and minA for sit to stand with RW. Upon standing, patient with L knee buckling with a couple of steps.Transferred to [facility] with minA and RW 2/2 knee buckling. Educated patient on posterior hip precautions and HEP packet, patient verbalized understanding. Patient will benefit from skilled PT services during acute stay to address listed deficits. Patient will benefit from ongoing therapy at discharge to maximize functional independence and safety.         If plan is discharge home, recommend the following: A little help with walking and/or transfers;A little help with bathing/dressing/bathroom;Assistance with cooking/housework;Assist for transportation   Can travel by private vehicle        Equipment Recommendations Rolling Daiveon Markman (2 wheels);BSC/3in1  Recommendations for Other Services       Functional Status Assessment Patient has had a recent decline in their functional status and demonstrates the ability to make significant improvements in function in a reasonable and predictable amount of time.     Precautions / Restrictions Precautions Precautions: Fall;Posterior Hip Precaution Booklet Issued: Yes (comment) Recall of Precautions/Restrictions: Intact Restrictions Weight Bearing Restrictions Per Provider Order: Yes LLE Weight Bearing Per Provider Order: Weight bearing as tolerated      Mobility  Bed Mobility Overal bed mobility: Needs Assistance Bed Mobility: Supine to Sit, Sit to Supine     Supine to sit: Contact guard Sit to  supine: Min assist   General bed mobility comments: assist to bring LLE back into bed    Transfers Overall transfer level: Needs assistance Equipment used: Rolling Laiklynn Raczynski (2 wheels) Transfers: Sit to/from Stand Sit to Stand: Min assist           General transfer comment: cues for hand placement    Ambulation/Gait Ambulation/Gait assistance: Mod assist Gait Distance (Feet): 3 Feet Assistive device: Rolling Omya Winfield (2 wheels) Gait Pattern/deviations: Step-to pattern, Decreased stride length, Knees buckling       General Gait Details: L knee buckling with each step  Stairs            Wheelchair Mobility     Tilt Bed    Modified Rankin (Stroke Patients Only)       Balance Overall balance assessment: Needs assistance Sitting-balance support: Feet supported, No upper extremity supported Sitting balance-Leahy Scale: Good     Standing balance support: Bilateral upper extremity supported, Reliant on assistive device for balance Standing balance-Leahy Scale: Poor                               Pertinent Vitals/Pain Pain Assessment Pain Assessment: Faces Faces Pain Scale: Hurts even more Pain Location: L hip Pain Descriptors / Indicators: Grimacing, Guarding, Sharp Pain Intervention(s): Limited activity within patient's tolerance, Monitored during session, Repositioned    Home Living Family/patient expects to be discharged to:: Private residence Living Arrangements: Spouse/significant other Available Help at Discharge: Family;Available 24 hours/day Type of Home: House Home Access: Level entry       Home Layout: One level Home Equipment: Cane - single point      Prior Function Prior Level  of Function : Independent/Modified Independent             Mobility Comments: uses cane intermittently       Extremity/Trunk Assessment   Upper Extremity Assessment Upper Extremity Assessment: Defer to OT evaluation    Lower Extremity  Assessment Lower Extremity Assessment: Generalized weakness;LLE deficits/detail LLE Deficits / Details: deficits consistent with post op pain and weakness       Communication   Communication Communication: Impaired Factors Affecting Communication: Hearing impaired    Cognition Arousal: Alert Behavior During Therapy: WFL for tasks assessed/performed   PT - Cognitive impairments: No apparent impairments                         Following commands: Intact       Cueing       General Comments      Exercises     Assessment/Plan    PT Assessment Patient needs continued PT services  PT Problem List Decreased strength;Decreased activity tolerance;Decreased balance;Decreased mobility;Decreased knowledge of use of DME;Decreased knowledge of precautions;Decreased safety awareness;Cardiopulmonary status limiting activity       PT Treatment Interventions DME instruction;Gait training;Functional mobility training;Therapeutic activities;Therapeutic exercise;Balance training;Neuromuscular re-education;Patient/family education    PT Goals (Current goals can be found in the Care Plan section)  Acute Rehab PT Goals Patient Stated Goal: to go home when ready PT Goal Formulation: With patient Time For Goal Achievement: 10/02/24 Potential to Achieve Goals: Good    Frequency BID     Co-evaluation               AM-PAC PT 6 Clicks Mobility  Outcome Measure Help needed turning from your back to your side while in a flat bed without using bedrails?: A Little Help needed moving from lying on your back to sitting on the side of a flat bed without using bedrails?: A Little Help needed moving to and from a bed to a chair (including a wheelchair)?: A Little Help needed standing up from a chair using your arms (e.g., wheelchair or bedside chair)?: A Little Help needed to walk in hospital room?: A Little Help needed climbing 3-5 steps with a railing? : A Little 6 Click  Score: 18    End of Session   Activity Tolerance: Patient tolerated treatment well Patient left: in bed;with call bell/phone within reach;with family/visitor present Nurse Communication: Mobility status PT Visit Diagnosis: Unsteadiness on feet (R26.81);Muscle weakness (generalized) (M62.81);Other abnormalities of gait and mobility (R26.89)    Time: 8652-8584 PT Time Calculation (min) (ACUTE ONLY): 28 min   Charges:   PT Evaluation $PT Eval Low Complexity: 1 Low PT Treatments $Therapeutic Activity: 8-22 mins PT General Charges $$ ACUTE PT VISIT: 1 Visit         Maryanne Finder, PT, DPT Physical Therapist - [facility]  [facility]   Daniah Zaldivar A Cj Beecher 09/18/2024, 4:02 PM

## 2024-09-19 ENCOUNTER — Encounter: Payer: Self-pay | Admitting: Surgery

## 2024-09-19 ENCOUNTER — Other Ambulatory Visit (HOSPITAL_COMMUNITY): Payer: Self-pay

## 2024-09-19 ENCOUNTER — Telehealth (HOSPITAL_COMMUNITY): Payer: Self-pay

## 2024-09-19 ENCOUNTER — Encounter: Payer: Self-pay | Admitting: Internal Medicine

## 2024-09-19 DIAGNOSIS — M1612 Unilateral primary osteoarthritis, left hip: Secondary | ICD-10-CM | POA: Diagnosis not present

## 2024-09-19 LAB — BASIC METABOLIC PANEL WITH GFR
Anion gap: 12 (ref 5–15)
BUN: 51 mg/dL — ABNORMAL HIGH (ref 8–23)
CO2: 18 mmol/L — ABNORMAL LOW (ref 22–32)
Calcium: 8.8 mg/dL — ABNORMAL LOW (ref 8.9–10.3)
Chloride: 107 mmol/L (ref 98–111)
Creatinine, Ser: 2.7 mg/dL — ABNORMAL HIGH (ref 0.44–1.00)
GFR, Estimated: 18 mL/min — ABNORMAL LOW
Glucose, Bld: 162 mg/dL — ABNORMAL HIGH (ref 70–99)
Potassium: 5 mmol/L (ref 3.5–5.1)
Sodium: 137 mmol/L (ref 135–145)

## 2024-09-19 LAB — CBC
HCT: 31.7 % — ABNORMAL LOW (ref 36.0–46.0)
Hemoglobin: 9.8 g/dL — ABNORMAL LOW (ref 12.0–15.0)
MCH: 29.2 pg (ref 26.0–34.0)
MCHC: 30.9 g/dL (ref 30.0–36.0)
MCV: 94.3 fL (ref 80.0–100.0)
Platelets: 146 K/uL — ABNORMAL LOW (ref 150–400)
RBC: 3.36 MIL/uL — ABNORMAL LOW (ref 3.87–5.11)
RDW: 13.6 % (ref 11.5–15.5)
WBC: 9.2 K/uL (ref 4.0–10.5)
nRBC: 0 % (ref 0.0–0.2)

## 2024-09-19 MED ORDER — AMLODIPINE BESYLATE 5 MG PO TABS
ORAL_TABLET | ORAL | Status: AC
  Filled 2024-09-19: qty 1

## 2024-09-19 MED ORDER — MENTHOL 3 MG MT LOZG
1.0000 | LOZENGE | OROMUCOSAL | Status: DC | PRN
  Filled 2024-09-19: qty 9

## 2024-09-19 NOTE — Evaluation (Signed)
 Occupational Therapy Evaluation Patient Details Name: Gabrielle Savage MRN: [id number] DOB: July 24, 1946 Today's Date: 09/19/2024   History of Present Illness   79 y/o female s/p L posterior THA on 09/18/24. PMH: T2DM, sensorineural hearing loss of both ears, kidney disease, HTN     Clinical Impressions Patient presenting with decreased Ind in self care,balance, functional mobility, transfers, endurance, and safety awareness. Patient reports living at home with husband and using SPC at baseline for mobility and self care independently. Pt is unable to verbalize posterior hip precautions so therapist educated pt and husband on them once more. OT related them to self care tasks and showed pt LH reacher to use for LB self care. Pt returning demonstrations to don pants with min A to stand from chair and pull over B hips. Pt then ambulates ~ 20' in room with RW and KI donned on L knee for additional support.Pt returning to recliner chair at end of session.  Patient will benefit from acute OT to increase overall independence in the areas of ADLs, functional mobility, and safety awareness in order to safely discharge.     If plan is discharge home, recommend the following:   A little help with walking and/or transfers;A little help with bathing/dressing/bathroom;Assistance with cooking/housework;Help with stairs or ramp for entrance;Assist for transportation     Functional Status Assessment   Patient has had a recent decline in their functional status and demonstrates the ability to make significant improvements in function in a reasonable and predictable amount of time.     Equipment Recommendations   Other (comment) (2WW)      Precautions/Restrictions   Precautions Precautions: Fall;Posterior Hip Precaution Booklet Issued: Yes (comment) Recall of Precautions/Restrictions: Intact Precaution/Restrictions Comments: knee immobilizer applied to prevent knee buckling Restrictions Weight  Bearing Restrictions Per Provider Order: Yes LLE Weight Bearing Per Provider Order: Weight bearing as tolerated     Mobility Bed Mobility               General bed mobility comments: seated in recliner chair at beginning and end of session    Transfers Overall transfer level: Needs assistance Equipment used: Rolling walker (2 wheels) Transfers: Sit to/from Stand Sit to Stand: Contact guard assist                  Balance Overall balance assessment: Needs assistance Sitting-balance support: Feet supported, No upper extremity supported Sitting balance-Leahy Scale: Good     Standing balance support: Bilateral upper extremity supported, During functional activity, Reliant on assistive device for balance Standing balance-Leahy Scale: Fair                             ADL either performed or assessed with clinical judgement   ADL Overall ADL's : Needs assistance/impaired                     Lower Body Dressing: Minimal assistance;Sit to/from stand;With adaptive equipment;Cueing for safety Lower Body Dressing Details (indicate cue type and reason): with use of LH reacher     Toileting- Clothing Manipulation and Hygiene: Contact guard assist;Sit to/from stand Toileting - Clothing Manipulation Details (indicate cue type and reason): simulated     Functional mobility during ADLs: Contact guard assist;Rolling walker (2 wheels)       Vision Baseline Vision/History: 1 Wears glasses Ability to See in Adequate Light: 0 Adequate Patient Visual Report: No change from baseline  Pertinent Vitals/Pain Pain Assessment Pain Assessment: 0-10 Pain Score: 3  Pain Location: L hip Pain Descriptors / Indicators: Discomfort, Aching Pain Intervention(s): Monitored during session, Premedicated before session, Repositioned     Extremity/Trunk Assessment Upper Extremity Assessment Upper Extremity Assessment: Overall WFL for tasks assessed    Lower Extremity Assessment Lower Extremity Assessment: Defer to PT evaluation       Communication Communication Communication: Impaired Factors Affecting Communication: Hearing impaired   Cognition Arousal: Alert Behavior During Therapy: WFL for tasks assessed/performed Cognition: No apparent impairments                               Following commands: Intact       Cueing  General Comments   Cueing Techniques: Verbal cues              Home Living Family/patient expects to be discharged to:: Private residence Living Arrangements: Spouse/significant other Available Help at Discharge: Family;Available 24 hours/day Type of Home: House Home Access: Level entry     Home Layout: One level     Bathroom Shower/Tub: Walk-in shower         Home Equipment: Cane - single point;BSC/3in1          Prior Functioning/Environment Prior Level of Function : Independent/Modified Independent             Mobility Comments: uses cane intermittently ADLs Comments: Ind with self care    OT Problem List: Decreased strength;Impaired balance (sitting and/or standing);Pain;Decreased safety awareness;Decreased activity tolerance;Decreased knowledge of use of DME or AE   OT Treatment/Interventions: Self-care/ADL training;Therapeutic exercise;Patient/family education;Balance training;Energy conservation;Therapeutic activities;DME and/or AE instruction      OT Goals(Current goals can be found in the care plan section)   Acute Rehab OT Goals Patient Stated Goal: to go home OT Goal Formulation: With patient Time For Goal Achievement: 10/03/24 Potential to Achieve Goals: Fair ADL Goals Pt Will Perform Grooming: with modified independence;standing Pt Will Perform Lower Body Dressing: with supervision;sit to/from stand;with adaptive equipment Pt Will Transfer to Toilet: with supervision;ambulating Pt Will Perform Toileting - Clothing Manipulation and hygiene:  with supervision;sit to/from stand   OT Frequency:  Min 2X/week       AM-PAC OT 6 Clicks Daily Activity     Outcome Measure Help from another person eating meals?: None Help from another person taking care of personal grooming?: A Little Help from another person toileting, which includes using toliet, bedpan, or urinal?: A Little Help from another person bathing (including washing, rinsing, drying)?: A Little Help from another person to put on and taking off regular upper body clothing?: A Little Help from another person to put on and taking off regular lower body clothing?: A Lot 6 Click Score: 18   End of Session Equipment Utilized During Treatment: Rolling walker (2 wheels) Nurse Communication: Mobility status  Activity Tolerance: Patient tolerated treatment well Patient left: in bed;with call bell/phone within reach;with bed alarm set  OT Visit Diagnosis: Unsteadiness on feet (R26.81);Muscle weakness (generalized) (M62.81);Repeated falls (R29.6)                Time: 1025-1050 OT Time Calculation (min): 25 min Charges:  OT General Charges $OT Visit: 1 Visit OT Evaluation $OT Eval Moderate Complexity: 1 Mod OT Treatments $Self Care/Home Management : 8-22 mins  Izetta Claude, MS, OTR/L , CBIS ascom [phone]  09/19/2024, 12:06 PM

## 2024-09-19 NOTE — Telephone Encounter (Signed)
 Pharmacy Patient Advocate Encounter  Insurance verification completed.    The patient is insured through [employer]. Patient has Medicare and is not eligible for a copay card, but may be able to apply for patient assistance or Medicare RX Payment Plan (Patient Must reach out to their plan, if eligible for payment plan), if available.    Ran test claim for Eliquis  5mg  tablet and the current 30 day co-pay is $47.   This test claim was processed through [facility]- copay amounts may vary at other pharmacies due to [employer], or as the patient moves through the different stages of their insurance plan.

## 2024-09-19 NOTE — Plan of Care (Signed)
" °  Problem: Urinary Elimination: Goal: Will remain free from infection Outcome: Progressing   "

## 2024-09-19 NOTE — Progress Notes (Signed)
 Physical Therapy Treatment Patient Details Name: Gabrielle Savage MRN: [id number] DOB: 1946-06-09 Today's Date: 09/19/2024   History of Present Illness 79 y/o female s/p L posterior THA on 09/18/24. PMH: T2DM, sensorineural hearing loss of both ears, kidney disease, HTN    PT Comments  Pt was long sitting in bed upon arrival. She is A and O x 4. Pleasant and cooperative throughout. She remains motivated throughout session and eager to improve functionally. Pt was able to achieve EOB short sitting without physical assist. Stood 4 x EOB with with min assist from standard height and CGA form elevated surfaces. Perform EOB standing exercises with severe L knee buckling with all RLE exercises. Knee immobilizer applied and used to take a few steps from EOB >recliner. Gait belt issued for future mobility/transfers/gait. She will benefit form continued skilled PT at DC to maximize independence and safety with all ADLs.    If plan is discharge home, recommend the following: A little help with walking and/or transfers;A little help with bathing/dressing/bathroom;Assistance with cooking/housework;Assist for transportation     Equipment Recommendations  Rolling walker (2 wheels);BSC/3in1       Precautions / Restrictions Precautions Precautions: Fall;Posterior Hip Precaution Booklet Issued: Yes (comment) Recall of Precautions/Restrictions: Intact Precaution/Restrictions Comments: knee immobilizer applied to prevent knee buckling Restrictions Weight Bearing Restrictions Per Provider Order: Yes LLE Weight Bearing Per Provider Order: Weight bearing as tolerated     Mobility  Bed Mobility Overal bed mobility: Needs Assistance Bed Mobility: Supine to Sit, Sit to Supine  Supine to sit: Supervision Sit to supine: Supervision General bed mobility comments: Pt was able to achieve EOB sitting 2 x and then later return to supine without physical assist. Vcs for improved technique and sequencing     Transfers Overall transfer level: Needs assistance Equipment used: Rolling walker (2 wheels) Transfers: Sit to/from Stand Sit to Stand: Min assist, Contact guard assist  General transfer comment: Min from standard heights, CGA for elevated bed height    Ambulation/Gait Ambulation/Gait assistance: Min assist Gait Distance (Feet): 3 Feet Assistive device: Rolling walker (2 wheels) Gait Pattern/deviations: Step-to pattern, Decreased stride length, Knees buckling Gait velocity: decreased  General Gait Details: L knee buckling with each step. Knee immobilizer applied to prevent future buckling   Balance Overall balance assessment: Needs assistance Sitting-balance support: Feet supported, No upper extremity supported Sitting balance-Leahy Scale: Good     Standing balance support: Bilateral upper extremity supported, During functional activity, Reliant on assistive device for balance Standing balance-Leahy Scale: Poor Standing balance comment: POt remains at high risk of falls.      Communication Communication Communication: Impaired Factors Affecting Communication: Hearing impaired  Cognition Arousal: Alert Behavior During Therapy: WFL for tasks assessed/performed   PT - Cognitive impairments: No apparent impairments    PT - Cognition Comments: Pt is A and O x 4. pleasant and motivated. Following commands: Intact      Cueing Cueing Techniques: Verbal cues         Pertinent Vitals/Pain Pain Assessment Pain Assessment: 0-10 Pain Score: 4  Pain Location: L hip Pain Descriptors / Indicators: Grimacing, Guarding, Sharp Pain Intervention(s): Limited activity within patient's tolerance, Monitored during session, Premedicated before session, Repositioned, Ice applied     PT Goals (current goals can now be found in the care plan section) Acute Rehab PT Goals Patient Stated Goal: go home when safe Progress towards PT goals: Progressing toward goals    Frequency     BID  AM-PAC PT 6 Clicks Mobility   Outcome Measure  Help needed turning from your back to your side while in a flat bed without using bedrails?: A Little Help needed moving from lying on your back to sitting on the side of a flat bed without using bedrails?: A Little Help needed moving to and from a bed to a chair (including a wheelchair)?: A Little Help needed standing up from a chair using your arms (e.g., wheelchair or bedside chair)?: A Little Help needed to walk in hospital room?: A Little Help needed climbing 3-5 steps with a railing? : A Little 6 Click Score: 18    End of Session   Activity Tolerance: Patient tolerated treatment well Patient left: in bed;with call bell/phone within reach;with family/visitor present Nurse Communication: Mobility status PT Visit Diagnosis: Unsteadiness on feet (R26.81);Muscle weakness (generalized) (M62.81);Other abnormalities of gait and mobility (R26.89)     Time: 9263-9193 PT Time Calculation (min) (ACUTE ONLY): 30 min  Charges:    $Gait Training: 8-22 mins $Therapeutic Activity: 8-22 mins PT General Charges $$ ACUTE PT VISIT: 1 Visit                    Rankin Essex PTA 09/19/2024, 9:01 AM

## 2024-09-19 NOTE — Progress Notes (Signed)
 " Subjective: 1 Day Post-Op Procedures (LRB): ARTHROPLASTY, HIP, TOTAL,POSTERIOR APPROACH (Left) Patient reports pain as 2 on 0-10 scale.   Patient is well, and has had no acute complaints or problems Plan is to go Home after hospital stay. Negative for chest pain and shortness of breath Fever: no Gastrointestinal:Negative for nausea and vomiting  Objective: Vital signs in last 24 hours: Temp:  [97.1 F (36.2 C)-98.8 F (37.1 C)] 97.1 F (36.2 C) (01/07 0449) Pulse Rate:  [67-72] 69 (01/07 0449) Resp:  [10-18] 16 (01/07 0449) BP: (114-168)/(54-76) 153/74 (01/07 0449) SpO2:  [93 %-100 %] 94 % (01/07 0449)  Intake/Output from previous day:  Intake/Output Summary (Last 24 hours) at 09/19/2024 0728 Last data filed at 09/18/2024 1641 Gross per 24 hour  Intake 629.33 ml  Output 150 ml  Net 479.33 ml    Intake/Output this shift: No intake/output data recorded.  Labs: Recent Labs    09/19/24 0538  HGB 9.8*   Recent Labs    09/19/24 0538  WBC 9.2  RBC 3.36*  HCT 31.7*  PLT 146*   Recent Labs    09/19/24 0538  NA 137  K 5.0  CL 107  CO2 18*  BUN 51*  CREATININE 2.70*  GLUCOSE 162*  CALCIUM 8.8*   No results for input(s): LABPT, INR in the last 72 hours.   EXAM General - Patient is Alert, Appropriate, and Oriented Extremity - ABD soft Neurovascular intact Dorsiflexion/Plantar flexion intact Incision: dressing C/D/I No cellulitis present Compartment soft Dressing/Incision - clean, dry, no drainage noted to the left hip honeycomb dressing Motor Function - intact, moving foot and toes well on exam.  Abdomen soft with intact bowel sounds.  Past Medical History:  Diagnosis Date   Aortic atherosclerosis    Arthritis    B12 deficiency    Bradycardia    CAD (coronary artery disease)    Carotid atherosclerosis    CKD (chronic kidney disease), stage III (HCC)    Diastolic dysfunction 09/03/2021   a.)  TTE 09/03/2021: EF 63%; no RWMAs; LA mildly enlarged;  trivial TR/PR, mild AR, moderate MR; G1DD.   DOE (dyspnea on exertion)    Fatigue    Gout    HLD (hyperlipidemia)    Hypertension    IDA (iron deficiency anemia)    Iliotibial band syndrome, left leg    MDD (major depressive disorder)    OAB (overactive bladder)    Obesity    PAC (premature atrial contraction)    noted on Holter   PONV (postoperative nausea and vomiting)    a.) single episode in 03/2013   PVC (premature ventricular contraction)    noted on Holter   Right kidney mass 10/27/2021   a.) CT 10/27/2021 -- mass measuring 8.3 x 6.8 x 8.1 cm   Sensorineural hearing loss (SNHL) of both ears    SUI (stress urinary incontinence, female)    SVT (supraventricular tachycardia)    noted on Holter   T2DM (type 2 diabetes mellitus) (HCC)    Uterovaginal prolapse    Vitamin D  deficiency    Voiding dysfunction     Assessment/Plan: 1 Day Post-Op Procedures (LRB): ARTHROPLASTY, HIP, TOTAL,POSTERIOR APPROACH (Left) Principal Problem:   Status post total hip replacement, left  Estimated body mass index is 38.73 kg/m as calculated from the following:   Height as of this encounter: 5' 1 (1.549 m).   Weight as of this encounter: 93 kg. Advance diet Up with therapy D/C IV fluids when tolerating po  intake.  Labs and vitals reviewed.  WBC 9.2, Hg 9.8 Continue to work on a BM. Up with therapy today.  Reports weakness with PT yesterday, can use knee immobilizer with therapy today if needed. Plan for discharge home today pending progress with PT.  DVT Prophylaxis - TED hose and SCDs, Eliquis  Weight-Bearing as tolerated to left leg  J. Gustavo Level, PA-C [facility] 09/19/2024, 7:28 AM  "

## 2024-09-19 NOTE — Anesthesia Postprocedure Evaluation (Signed)
"   Anesthesia Post Note  Patient: Gabrielle Savage  Procedure(s) Performed: ARTHROPLASTY, HIP, TOTAL,POSTERIOR APPROACH (Left: Hip)  Patient location during evaluation: Mother Baby Anesthesia Type: Spinal Level of consciousness: awake and alert Pain management: pain level controlled Vital Signs Assessment: post-procedure vital signs reviewed and stable Respiratory status: spontaneous breathing, nonlabored ventilation and respiratory function stable Cardiovascular status: stable Postop Assessment: no headache, no backache and epidural receding Anesthetic complications: no   No notable events documented.   Last Vitals:  Vitals:   09/19/24 0807 09/19/24 0809  BP: (!) 153/68 (!) 153/68  Pulse: 64   Resp: 17   Temp: 36.6 C   SpO2: 98%     Last Pain:  Vitals:   09/19/24 0813  TempSrc:   PainSc: 5                  Adyline Huberty      "

## 2024-09-19 NOTE — Progress Notes (Signed)
 DISCHARGE NOTE:  Pt dc with IV removed and dc instructions given. Pt received equipment and medication that husband took home on 09/19/2023. Pt voices no questions or concerns at this time. Pt wheeled down to medical mall entrance by staff. Pt's husband provided transportation.

## 2024-09-19 NOTE — Plan of Care (Signed)
" °  Problem: Education: Goal: Knowledge of the prescribed therapeutic regimen will improve Outcome: Progressing   Problem: Bowel/Gastric: Goal: Gastrointestinal status for postoperative course will improve Outcome: Progressing   Problem: Cardiac: Goal: Ability to maintain an adequate cardiac output Outcome: Progressing   Problem: Nutritional: Goal: Will attain and maintain optimal nutritional status Outcome: Progressing   Problem: Neurological: Goal: Will regain or maintain usual level of consciousness Outcome: Progressing   "

## 2024-09-19 NOTE — Discharge Summary (Signed)
 " Physician Discharge Summary  Patient ID: Gabrielle Savage MRN: [id number] DOB/AGE: 11/08/1945 79 y.o.  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]  Discharge Diagnoses: Patient Active Problem List   Diagnosis Date Noted   Status post total hip replacement, left 09/18/2024   S/p nephrectomy 10/28/2022   Proteinuria, unspecified 02/11/2022   History of radical nephrectomy 02/11/2022   Chronic depression 02/11/2022   Anemia in chronic kidney disease 02/11/2022   Acidosis, unspecified 02/11/2022   Clear cell carcinoma of kidney, right (HCC) 12/12/2021   Diabetes mellitus without complication (HCC) 12/12/2021   Gout 12/12/2021   Cancer of kidney (HCC) 11/20/2021   Chronic superficial gastritis without bleeding 10/27/2021   Elevated ferritin 10/27/2021   Epigastric abdominal tenderness 10/27/2021   Generalized abdominal pain 10/27/2021   Hx of adenomatous polyp of colon 10/27/2021   Unintended weight loss 10/27/2021   Abnormal ECG 08/19/2021   Bilateral carotid artery stenosis 08/19/2021   Hyperlipidemia, mixed 08/19/2021   SOBOE (shortness of breath on exertion) 08/19/2021   Elevated alkaline phosphatase level 08/10/2021   Iron deficiency anemia 08/10/2021   Major depressive disorder, recurrent, in remission 01/01/2020   CKD (chronic kidney disease) stage 3, GFR 30-59 ml/min (HCC) 06/20/2017   Elevated blood sugar level 06/20/2017   History of partial knee replacement 05/12/2017   Iliotibial band syndrome of left side 05/12/2017   Disequilibrium 02/10/2016   Sensorineural hearing loss of both ears 06/13/2015   Fatigue 05/05/2015   Encounter for fitting or adjustment of hearing aid 07/19/2014   OAB (overactive bladder) 08/23/2013   Post-operative state 08/23/2013   Voiding dysfunction 08/23/2013   SUI (stress urinary incontinence, female) 07/09/2013    Uterovaginal prolapse 07/09/2013   Chronic kidney disease 06/26/2013   Hypertension 06/26/2013   Obesity 06/26/2013   Prolapse of female pelvic organs 06/26/2013   Vitamin D  deficiency 06/26/2013    Past Medical History:  Diagnosis Date   Aortic atherosclerosis    Arthritis    B12 deficiency    Bradycardia    CAD (coronary artery disease)    Carotid atherosclerosis    CKD (chronic kidney disease), stage III (HCC)    Diastolic dysfunction 09/03/2021   a.)  TTE 09/03/2021: EF 63%; no RWMAs; LA mildly enlarged; trivial TR/PR, mild AR, moderate MR; G1DD.   DOE (dyspnea on exertion)    Fatigue    Gout    HLD (hyperlipidemia)    Hypertension    IDA (iron deficiency anemia)    Iliotibial band syndrome, left leg    MDD (major depressive disorder)    OAB (overactive bladder)    Obesity    PAC (premature atrial contraction)    noted on Holter   PONV (postoperative nausea and vomiting)    a.) single episode in 03/2013   PVC (premature ventricular contraction)    noted on Holter   Right kidney mass 10/27/2021   a.) CT 10/27/2021 -- mass measuring 8.3 x 6.8 x 8.1 cm   Sensorineural hearing loss (SNHL) of both ears    SUI (stress urinary incontinence, female)    SVT (supraventricular tachycardia)    noted on Holter   T2DM (type 2 diabetes mellitus) (HCC)    Uterovaginal prolapse    Vitamin D  deficiency    Voiding dysfunction      Transfusion: None.   Consultants (if any):   Discharged Condition: Improved  Hospital Course: Oliviana Mcgahee  Gabrielle Savage is an 79 y.o. female who was admitted 09/18/2024 with a diagnosis of primary osteoarthritis of the left hip and went to the operating room on 09/18/2024 and underwent the above named procedures.    Surgeries: Procedures: ARTHROPLASTY, HIP, TOTAL,POSTERIOR APPROACH on 09/18/2024 Patient tolerated the surgery well. Taken to PACU where she was stabilized and then transferred to the post-op recovery area.  Started on Eliquis  2.5mg  every 12 hrs.  Heels elevated on bed with rolled towels. No evidence of DVT. Negative Homan. Physical therapy started on day #1 for gait training and transfer. OT started day #1 for ADL and assisted devices.  Patient's IV was removed on POD1.  Implants: Biomet press-fit system with a #13 laterally offset Echo femoral stem, a 50 mm acetabular shell with an E-poly hi-wall liner, and a 36 mm ceramic head with a -6 mm neck adapter.   She was given perioperative antibiotics:  Anti-infectives (From admission, onward)    Start     Dose/Rate Route Frequency Ordered Stop   09/18/24 1400  ceFAZolin  (ANCEF ) IVPB 2g/100 mL premix        2 g 200 mL/hr over 30 Minutes Intravenous Every 6 hours 09/18/24 0938 09/18/24 2056   09/18/24 0615  ceFAZolin  (ANCEF ) IVPB 2g/100 mL premix        2 g 200 mL/hr over 30 Minutes Intravenous On call to O.R. 09/18/24 [id number] 09/18/24 0805     .  She was given sequential compression devices, early ambulation, and Eliquis  for DVT prophylaxis.  She benefited maximally from the hospital stay and there were no complications.    Recent vital signs:  Vitals:   09/19/24 0031 09/19/24 0449  BP: (!) 142/68 (!) 153/74  Pulse: 70 69  Resp:  16  Temp: 98.2 F (36.8 C) (!) 97.1 F (36.2 C)  SpO2:  94%    Recent laboratory studies:  Lab Results  Component Value Date   HGB 9.8 (L) 09/19/2024   HGB 12.7 09/10/2024   HGB 12.0 02/28/2024   Lab Results  Component Value Date   WBC 9.2 09/19/2024   PLT 146 (L) 09/19/2024   Lab Results  Component Value Date   INR 1.1 11/13/2021   Lab Results  Component Value Date   NA 137 09/19/2024   K 5.0 09/19/2024   CL 107 09/19/2024   CO2 18 (L) 09/19/2024   BUN 51 (H) 09/19/2024   CREATININE 2.70 (H) 09/19/2024   GLUCOSE 162 (H) 09/19/2024    Discharge Medications:   Allergies as of 09/19/2024       Reactions   Codeine Nausea Only   Nsaids Other (See Comments)   Bad kidneys   Oxycodone  Nausea Only        Medication List      TAKE these medications    Accu-Chek Softclix Lancets lancets USE 1 TO CHECK GLUCOSE ONCE DAILY   allopurinol  100 MG tablet Commonly known as: ZYLOPRIM  Take 100 mg by mouth in the morning.   amLODipine  2.5 MG tablet Commonly known as: NORVASC  Take 2.5 mg by mouth in the morning. 2.5 mg + 5 mg=7.5 mg   amLODipine  5 MG tablet Commonly known as: NORVASC  Take 5 mg by mouth in the morning. 5 mg + 2.5 mg=7.5 mg   apixaban  2.5 MG Tabs tablet Commonly known as: Eliquis  Take 1 tablet (2.5 mg total) by mouth 2 (two) times daily.   calcitRIOL 0.25 MCG capsule Commonly known as: ROCALTROL Take 0.25 mcg by mouth every other day.  cyanocobalamin 1000 MCG tablet Commonly known as: VITAMIN B12 Take 1,000 mcg by mouth in the morning.   ergocalciferol  1.25 MG (50000 UT) capsule Commonly known as: VITAMIN D2 Take 1 capsule (50,000 Units total) by mouth once a week.   Farxiga 5 MG Tabs tablet Generic drug: dapagliflozin propanediol Take 5 mg by mouth daily.   ferrous sulfate  325 (65 FE) MG tablet Take 325 mg by mouth every other day.   gabapentin  100 MG capsule Commonly known as: NEURONTIN  Take 100 mg by mouth in the morning and at bedtime.   hydroxychloroquine 200 MG tablet Commonly known as: PLAQUENIL Take 200 mg by mouth 2 (two) times daily.   irbesartan  150 MG tablet Commonly known as: AVAPRO  Take 150 mg by mouth in the morning.   levothyroxine  25 MCG tablet Commonly known as: SYNTHROID  Take 25 mcg by mouth daily before breakfast.   ondansetron  4 MG disintegrating tablet Commonly known as: ZOFRAN -ODT Take 1 tablet (4 mg total) by mouth every 8 (eight) hours as needed.   oxyCODONE  5 MG immediate release tablet Commonly known as: Roxicodone  Take 1-2 tablets (5-10 mg total) by mouth every 4 (four) hours as needed for moderate pain (pain score 4-6) or severe pain (pain score 7-10).   sertraline  100 MG tablet Commonly known as: ZOLOFT  Take 100 mg by mouth in the  morning.   traMADol  50 MG tablet Commonly known as: ULTRAM  Take 1 tablet (50 mg total) by mouth every 6 (six) hours as needed (Breakthrough pain). What changed: reasons to take this               Durable Medical Equipment  (From admission, onward)           Start     Ordered   09/18/24 1638  DME 3 n 1  Once        09/18/24 1637   09/18/24 1638  DME Walker rolling  Once       Question Answer Comment  Walker: With 5 Inch Wheels   Patient needs a walker to treat with the following condition Status post total hip replacement, left      09/18/24 1637            Diagnostic Studies: DG HIP UNILAT W OR W/O PELVIS 2-3 VIEWS LEFT Result Date: 09/18/2024 CLINICAL DATA:  Status post left hip replacement. EXAM: DG HIP (WITH OR WITHOUT PELVIS) 2-3V*L* COMPARISON:  Chest, abdomen and pelvis CT dated 08/29/2024. FINDINGS: Interval left hip bipolar prosthesis. The acetabular cup is somewhat inferolaterally positioned. No acute fracture or dislocation. Mild-to-moderate right hip degenerative changes. IMPRESSION: Interval left hip bipolar prosthesis with a somewhat inferolaterally positioned acetabular cup. Electronically Signed   By: Elspeth Bathe M.D.   On: 09/18/2024 16:27   CT CHEST ABDOMEN PELVIS WO CONTRAST Result Date: 09/05/2024 CLINICAL DATA:  kidney cancer. Status post right nephrectomy. * Tracking Code: BO * EXAM: CT CHEST, ABDOMEN AND PELVIS WITHOUT CONTRAST TECHNIQUE: Multidetector CT imaging of the chest, abdomen and pelvis was performed following the standard protocol without IV contrast. RADIATION DOSE REDUCTION: This exam was performed according to the departmental dose-optimization program which includes automated exposure control, adjustment of the mA and/or kV according to patient size and/or use of iterative reconstruction technique. COMPARISON:  CT scan chest from 02/10/2023 and CT scan abdomen and pelvis from 02/15/2024. FINDINGS: CT CHEST FINDINGS Cardiovascular: Normal  cardiac size. No pericardial effusion. No aortic aneurysm. There are coronary artery calcifications, in keeping with coronary artery disease.  There are also mild peripheral atherosclerotic vascular calcifications of thoracic aorta and its major branches. Mediastinum/Nodes: Visualized thyroid  gland appears grossly unremarkable. No solid / cystic mediastinal masses. The esophagus is nondistended precluding optimal assessment. There is mild circumferential thickening of the lower thoracic esophagus, which is most likely seen in the settings of chronic gastroesophageal reflux disease versus esophagitis. No axillary, mediastinal or hilar lymphadenopathy by size criteria. Lungs/Pleura: The central tracheo-bronchial tree is patent. There are dependent changes in bilateral lungs. There are patchy areas of linear, plate-like atelectasis and/or scarring throughout bilateral lungs. No mass or consolidation. No pleural effusion or pneumothorax. No suspicious lung nodules. Musculoskeletal: The visualized soft tissues of the chest wall are grossly unremarkable. No suspicious osseous lesions. There are mild to moderate multilevel degenerative changes in the visualized spine. CT ABDOMEN PELVIS FINDINGS Hepatobiliary: The liver is normal in size. Non-cirrhotic configuration. No suspicious mass. There is a stable subcapsular simple cyst in the right hepatic lobe. No intrahepatic or extrahepatic bile duct dilation. Gallbladder is surgically absent. Pancreas: Unremarkable. No pancreatic ductal dilatation or surrounding inflammatory changes. Spleen: Within normal limits. No focal lesion. Adrenals/Urinary Tract: Adrenal glands are unremarkable. Surgically absent right kidney. No local recurrent tumor noted in the right nephrectomy bed. At least mild diffuse atrophy of the left kidney. There are at least 2 simple cysts in the upper pole. No suspicious mass noted within the limitations of this unenhanced exam. No left  nephroureterolithiasis or obstructive uropathy. Urinary bladder is partially distended and suboptimally evaluated. No discrete mass or perivesical fat stranding noted. Stomach/Bowel: No disproportionate dilation of the small or large bowel loops. No evidence of abnormal bowel wall thickening or inflammatory changes. The appendix is unremarkable. There are multiple diverticula throughout the colon, without imaging signs of diverticulitis. Vascular/Lymphatic: No ascites or pneumoperitoneum. No abdominal or pelvic lymphadenopathy, by size criteria. No aneurysmal dilation of the major abdominal arteries. There are mild peripheral atherosclerotic vascular calcifications of the aorta and its major branches. Reproductive: The uterus is surgically absent. No large adnexal mass. Other: There are small fat containing umbilical and right inguinal hernias. The soft tissues and abdominal wall are otherwise unremarkable. Musculoskeletal: No suspicious osseous lesions. There are moderate multilevel degenerative changes in the visualized spine. IMPRESSION: 1. No metastatic disease identified within the chest, abdomen or pelvis. 2. Multiple other nonacute observations, as described above. Aortic Atherosclerosis (ICD10-I70.0). Electronically Signed   By: Ree Molt M.D.   On: 09/05/2024 14:07   Disposition: Plan for discharge home today with HHPT pending progress with PT.   Follow-up Information     Kip Lynwood Double, PA-C Follow up on 10/03/2024.   Specialty: Physician Assistant Why: Follow up Wednesday 1/21 at 1:15 PM Contact information: [address] [place] [address] [phone]                Signed: Lynwood LITTIE Kip PA-C 09/19/2024, 7:30 AM  "

## 2024-09-25 ENCOUNTER — Encounter: Payer: Self-pay | Admitting: Surgery

## 2024-10-02 ENCOUNTER — Other Ambulatory Visit: Payer: Self-pay | Admitting: Internal Medicine

## 2025-03-04 ENCOUNTER — Other Ambulatory Visit

## 2025-03-11 ENCOUNTER — Inpatient Hospital Stay: Admitting: Internal Medicine

## 2025-03-11 ENCOUNTER — Inpatient Hospital Stay
